# Patient Record
Sex: Female | Born: 1935 | Race: Black or African American | Hispanic: No | Marital: Single | Smoking: Never smoker
Health system: Southern US, Community
[De-identification: ages and names within clinical notes are randomized; demographics above are authoritative.]

---

## 1997-07-19 ENCOUNTER — Other Ambulatory Visit: Admission: RE | Admit: 1997-07-19 | Discharge: 1997-07-19 | Payer: Self-pay | Admitting: *Deleted

## 1999-03-26 ENCOUNTER — Encounter: Payer: Self-pay | Admitting: Emergency Medicine

## 1999-03-26 ENCOUNTER — Emergency Department (HOSPITAL_COMMUNITY): Admission: EM | Admit: 1999-03-26 | Discharge: 1999-03-26 | Payer: Self-pay | Admitting: Emergency Medicine

## 1999-04-17 ENCOUNTER — Encounter: Admission: RE | Admit: 1999-04-17 | Discharge: 1999-04-17 | Payer: Self-pay | Admitting: *Deleted

## 1999-04-17 ENCOUNTER — Encounter: Payer: Self-pay | Admitting: *Deleted

## 2000-06-19 ENCOUNTER — Encounter: Payer: Self-pay | Admitting: Internal Medicine

## 2000-06-19 ENCOUNTER — Encounter: Admission: RE | Admit: 2000-06-19 | Discharge: 2000-06-19 | Payer: Self-pay | Admitting: Internal Medicine

## 2001-03-10 ENCOUNTER — Encounter: Admission: RE | Admit: 2001-03-10 | Discharge: 2001-03-10 | Payer: Self-pay | Admitting: Internal Medicine

## 2001-03-10 ENCOUNTER — Encounter: Payer: Self-pay | Admitting: Internal Medicine

## 2001-06-08 ENCOUNTER — Encounter: Admission: RE | Admit: 2001-06-08 | Discharge: 2001-06-08 | Payer: Self-pay | Admitting: Internal Medicine

## 2001-06-08 ENCOUNTER — Encounter: Payer: Self-pay | Admitting: Internal Medicine

## 2002-06-21 ENCOUNTER — Encounter: Admission: RE | Admit: 2002-06-21 | Discharge: 2002-06-21 | Payer: Self-pay | Admitting: Internal Medicine

## 2002-06-21 ENCOUNTER — Encounter: Payer: Self-pay | Admitting: Internal Medicine

## 2003-06-27 ENCOUNTER — Encounter: Admission: RE | Admit: 2003-06-27 | Discharge: 2003-06-27 | Payer: Self-pay | Admitting: Internal Medicine

## 2003-07-11 ENCOUNTER — Encounter: Admission: RE | Admit: 2003-07-11 | Discharge: 2003-07-11 | Payer: Self-pay | Admitting: Internal Medicine

## 2003-07-11 IMAGING — CR DG CHEST 2V
2 series · 2 of 2 positions shown · non-contrast
Comparison: none

CLINICAL DATA: Cough and wheezing in the right lower lobe.
 PA AND LATERAL CHEST
 Comparison [DATE].

 There is slight tortuosity of the thoracic aorta, but the overall heart size and vascularity are normal.  The patient does have some mild linear atelectasis at both lung bases without acute infiltrates or effusions.  No significant bony abnormality.
 IMPRESSION
 Mild bibasilar atelectasis.

[view not recorded (1 of 2)]
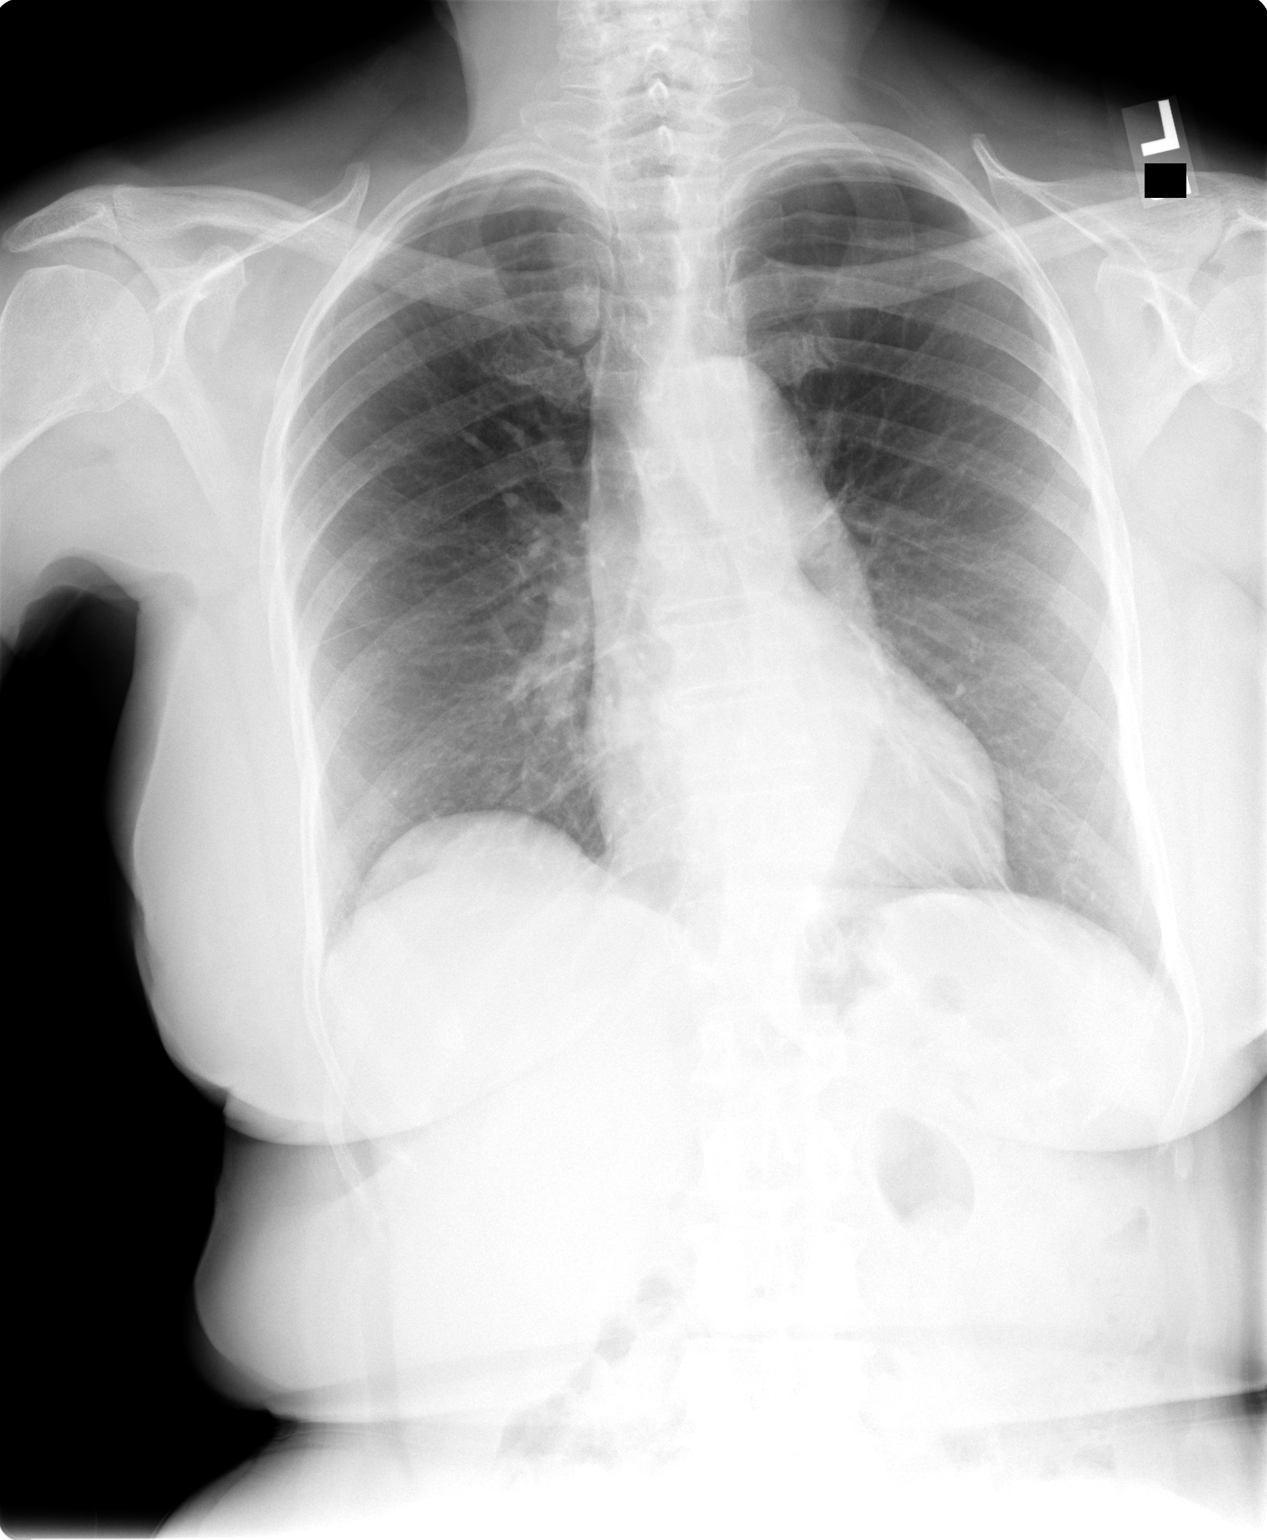

[view not recorded (2 of 2)]
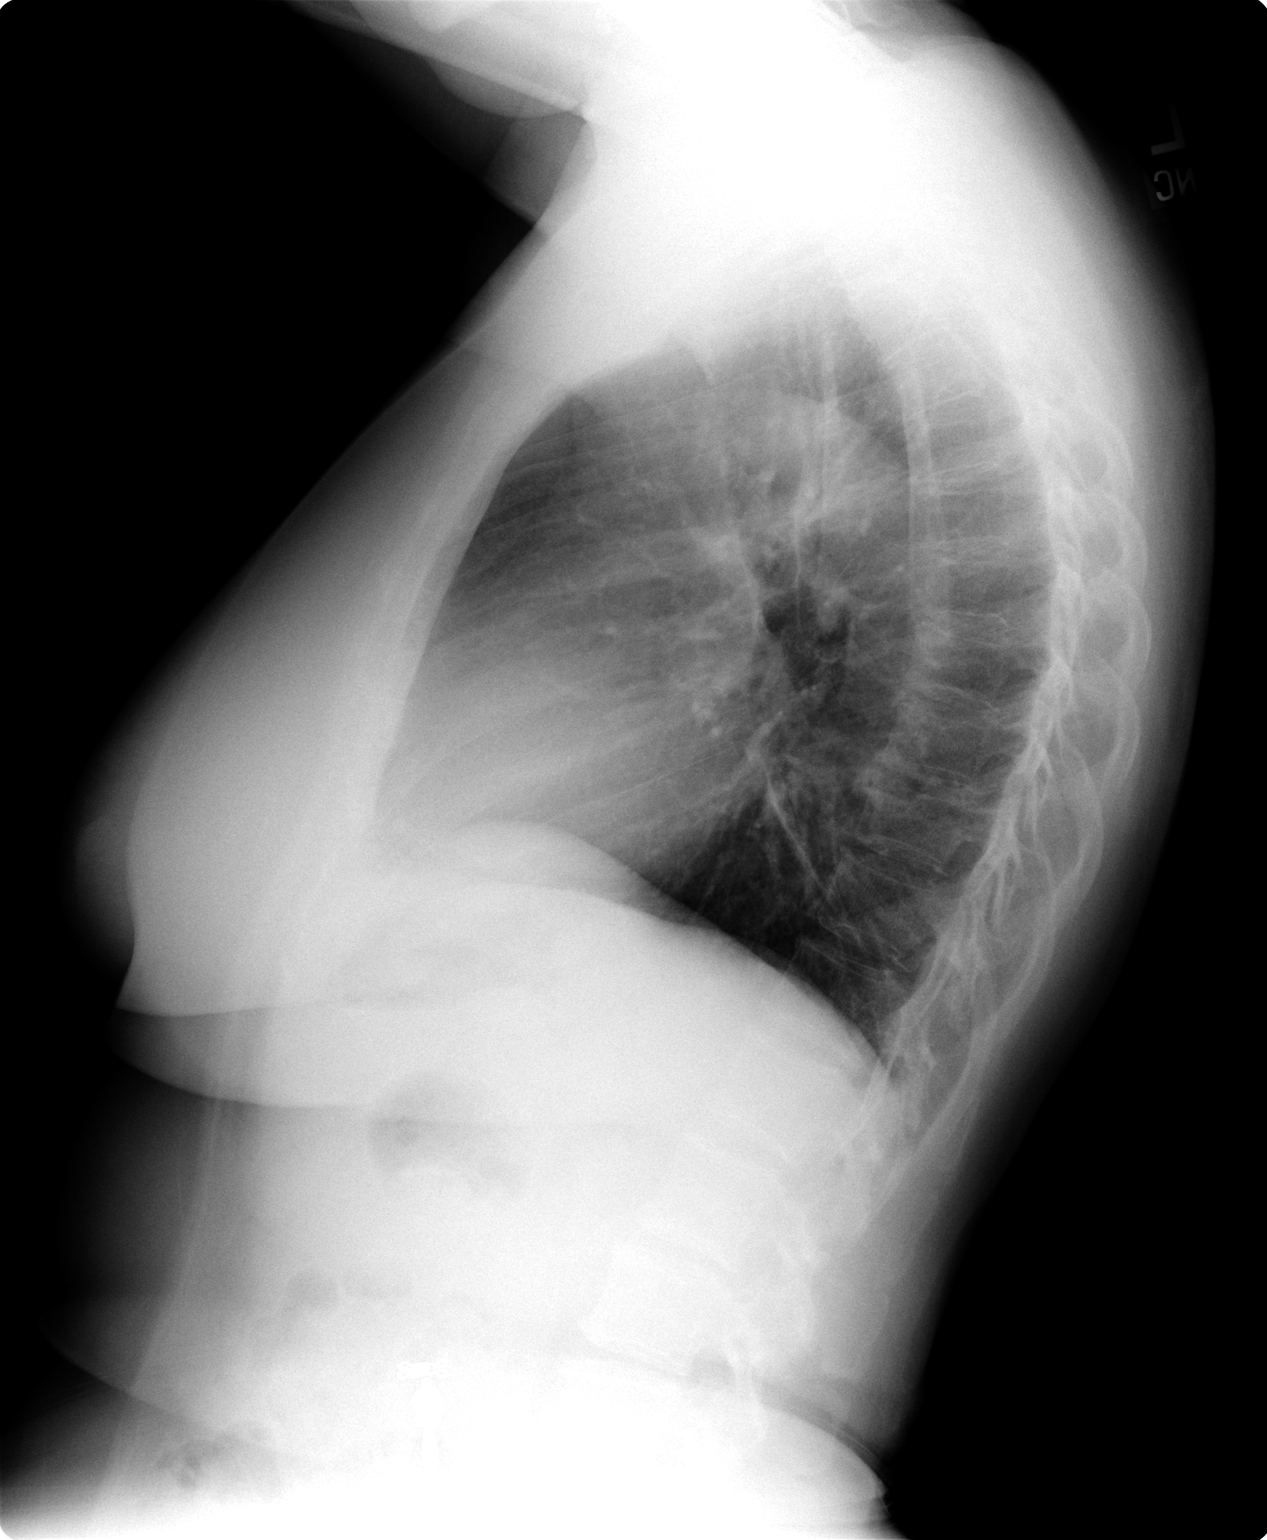

[2 of 2 positions shown; findings below may reference images not displayed]

## 2003-08-09 ENCOUNTER — Encounter (INDEPENDENT_AMBULATORY_CARE_PROVIDER_SITE_OTHER): Payer: Self-pay | Admitting: Specialist

## 2003-08-09 ENCOUNTER — Ambulatory Visit (HOSPITAL_COMMUNITY): Admission: RE | Admit: 2003-08-09 | Discharge: 2003-08-09 | Payer: Self-pay | Admitting: Gastroenterology

## 2004-06-28 ENCOUNTER — Encounter: Admission: RE | Admit: 2004-06-28 | Discharge: 2004-06-28 | Payer: Self-pay | Admitting: Internal Medicine

## 2004-10-15 ENCOUNTER — Encounter: Admission: RE | Admit: 2004-10-15 | Discharge: 2004-10-15 | Payer: Self-pay | Admitting: Nephrology

## 2004-10-15 IMAGING — US US RENAL
1 series · 14 of 25 positions shown · non-contrast
Comparison: none

CLINICAL DATA: Chronic renal insufficiency, hyperlipidemia

Renal ultrasound:
Right kidney measures at least 8.6 cm in length, left 9.2 cm. Renal parenchyma
is slightly echogenic compared to the adjacent liver. No focal lesions
identified. No hydronephrosis. Urinary bladder incompletely distended.

[Series 1: unknown · 0.19mm/px · 14 of 30 slices shown]
[im 1/30]
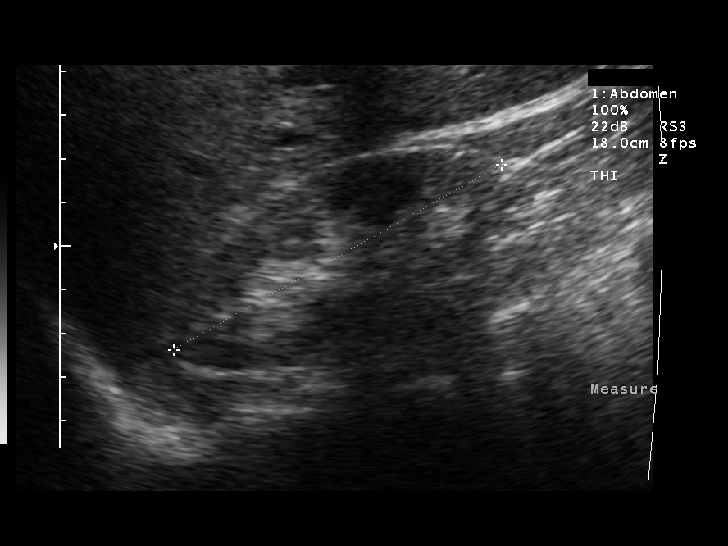
[im 3/30]
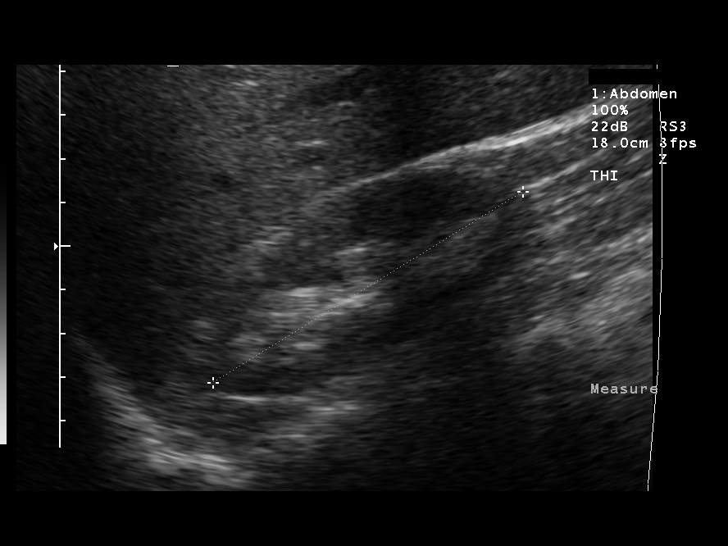
[im 5/30]
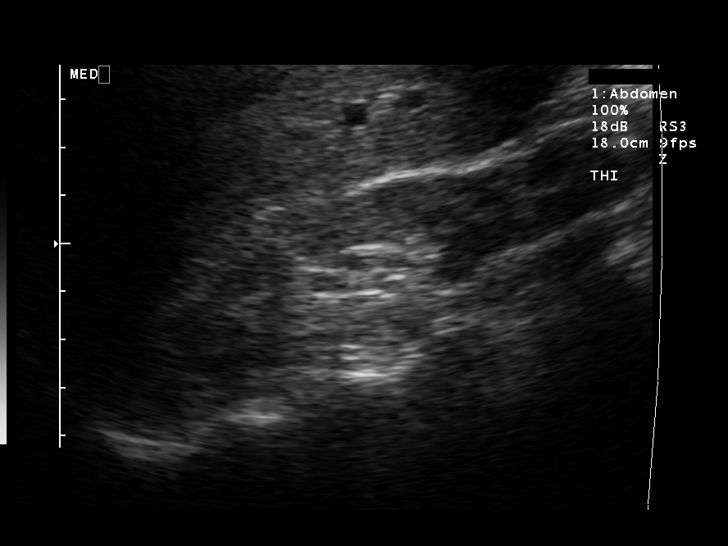
[im 8/30]
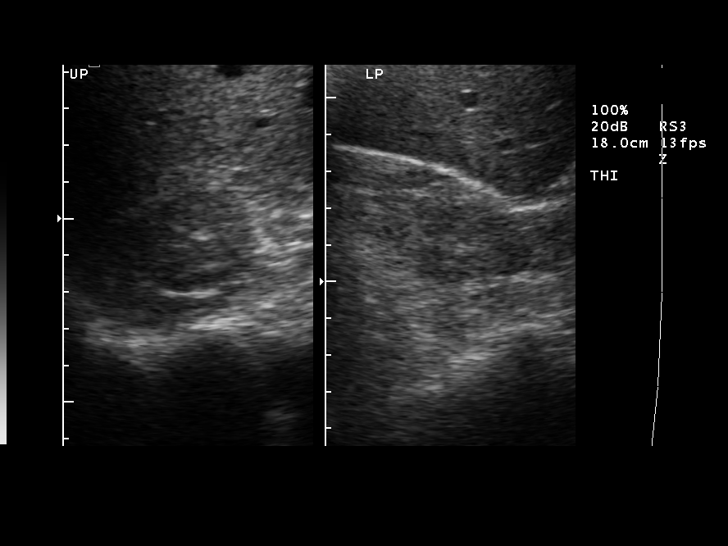
[im 10/30]
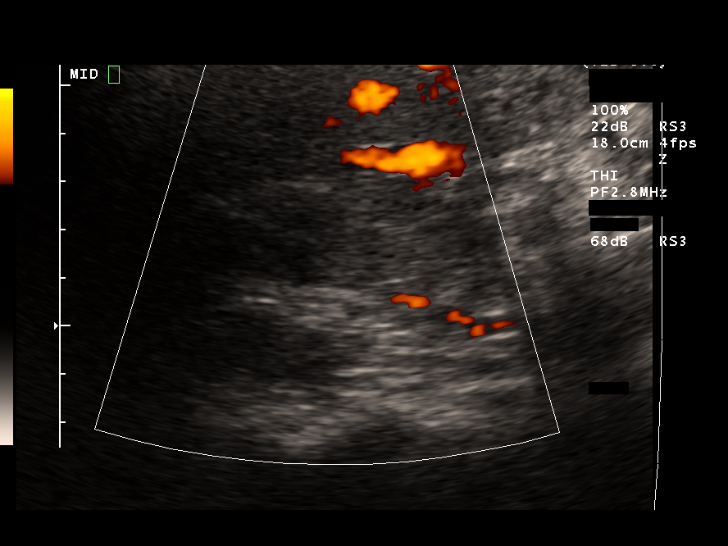
[im 11/30]
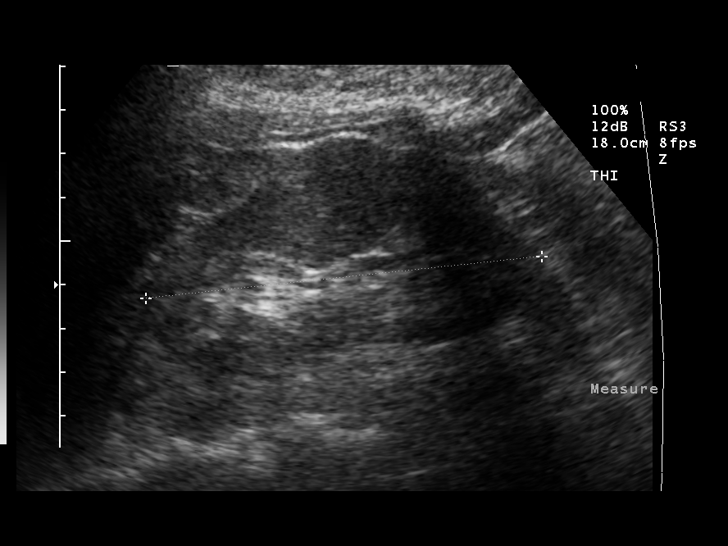
[im 14/30]
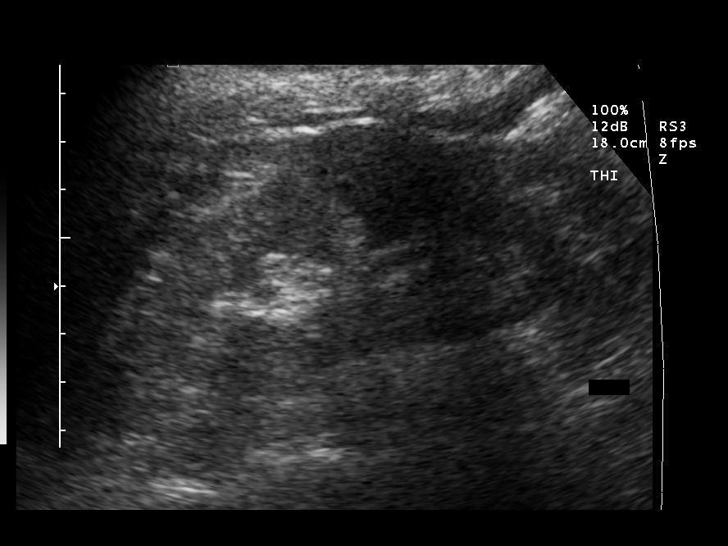
[im 16/30]
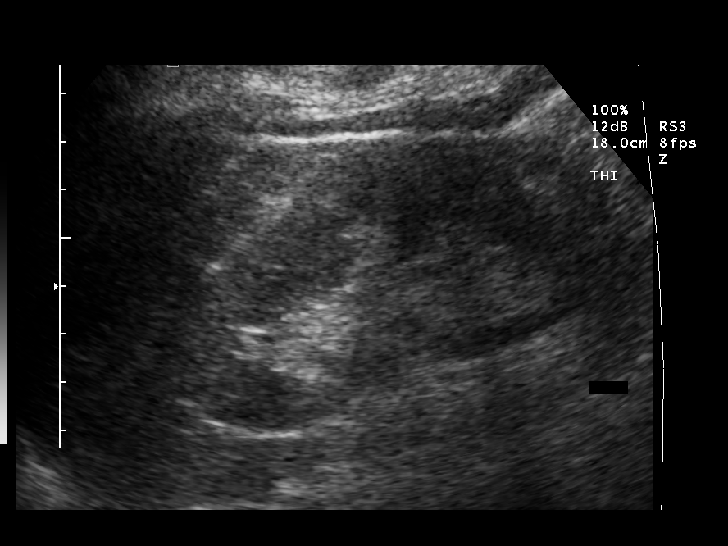
[im 19/30]
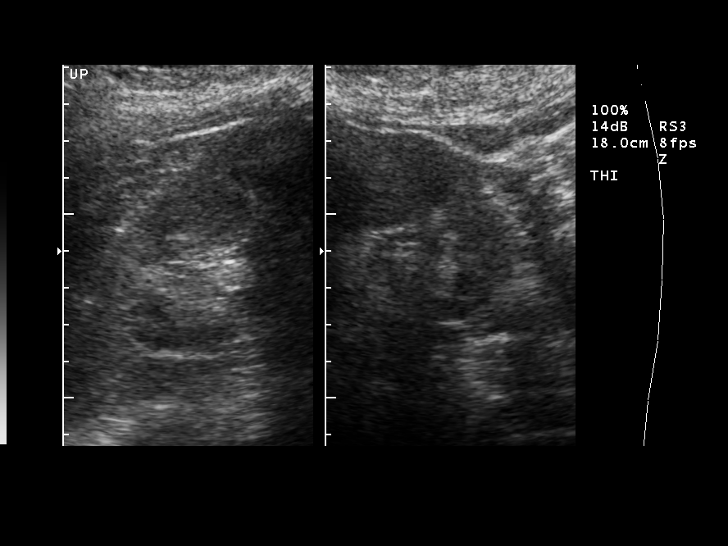
[im 20/30]
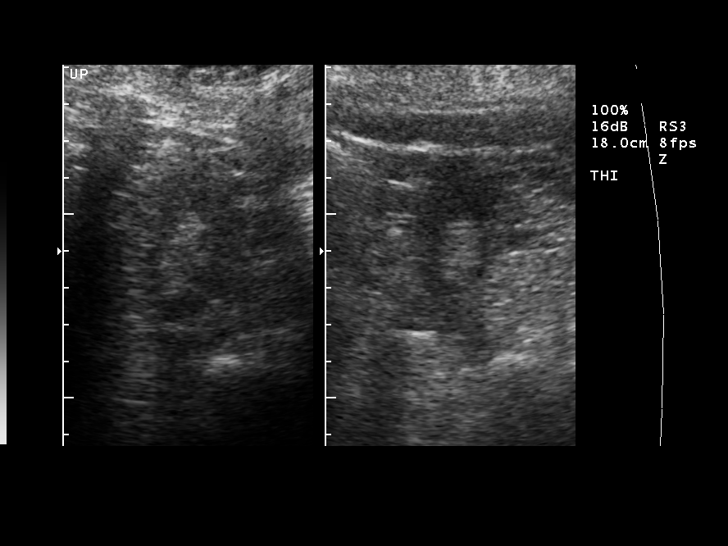
[im 22/30]
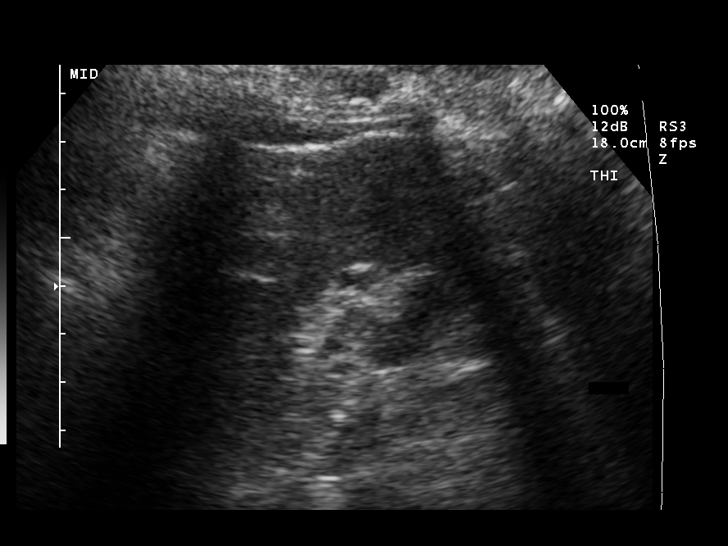
[im 25/30]
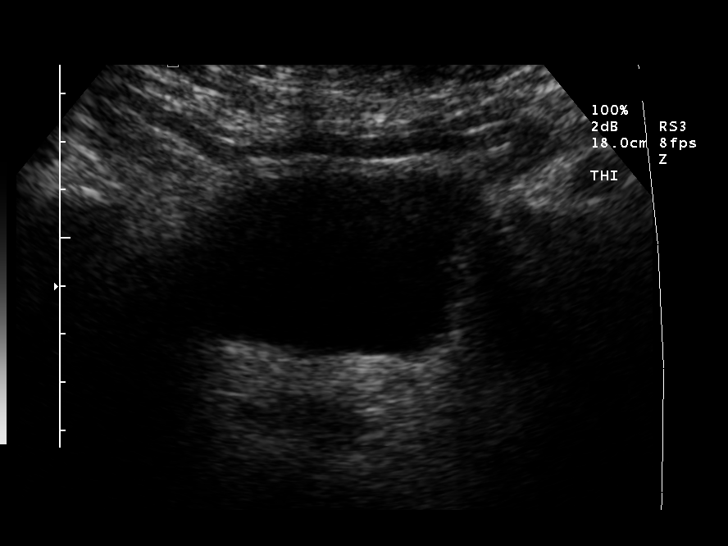
[im 27/30]
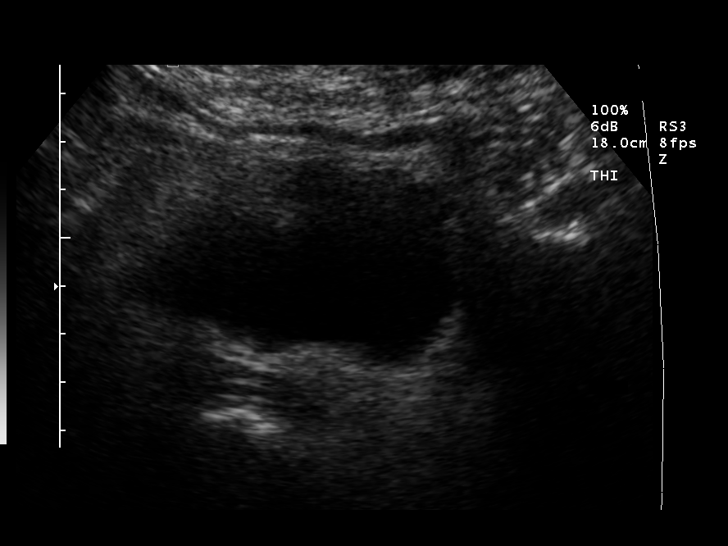
[im 30/30]
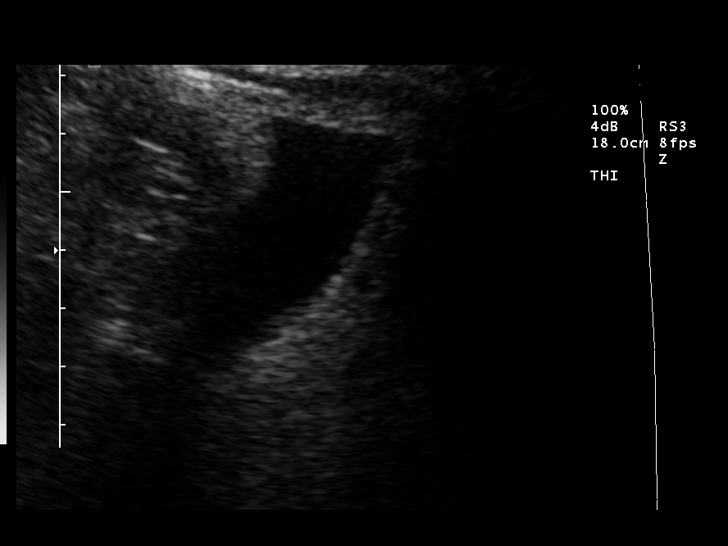

[14 of 25 positions shown; findings below may reference images not displayed]

IMPRESSION: 1. Negative for hydronephrosis.
2. Mildly echogenic renal parenchyma, a nonspecific indicator of medical renal
disease.

## 2005-07-02 ENCOUNTER — Encounter: Admission: RE | Admit: 2005-07-02 | Discharge: 2005-07-02 | Payer: Self-pay | Admitting: Internal Medicine

## 2006-07-07 ENCOUNTER — Encounter: Admission: RE | Admit: 2006-07-07 | Discharge: 2006-07-07 | Payer: Self-pay | Admitting: Internal Medicine

## 2007-05-22 ENCOUNTER — Emergency Department (HOSPITAL_COMMUNITY): Admission: EM | Admit: 2007-05-22 | Discharge: 2007-05-22 | Payer: Self-pay | Admitting: Emergency Medicine

## 2007-07-08 ENCOUNTER — Encounter: Admission: RE | Admit: 2007-07-08 | Discharge: 2007-07-08 | Payer: Self-pay | Admitting: Internal Medicine

## 2007-08-04 ENCOUNTER — Encounter: Admission: RE | Admit: 2007-08-04 | Discharge: 2007-08-04 | Payer: Self-pay | Admitting: Nephrology

## 2007-12-16 IMAGING — CR DG CHEST 2V
2 series · 2 of 2 positions shown · non-contrast
Comparison: [DATE]

CLINICAL DATA: Dry cough, weight loss

CHEST - 2 VIEW

[view not recorded (1 of 2)]
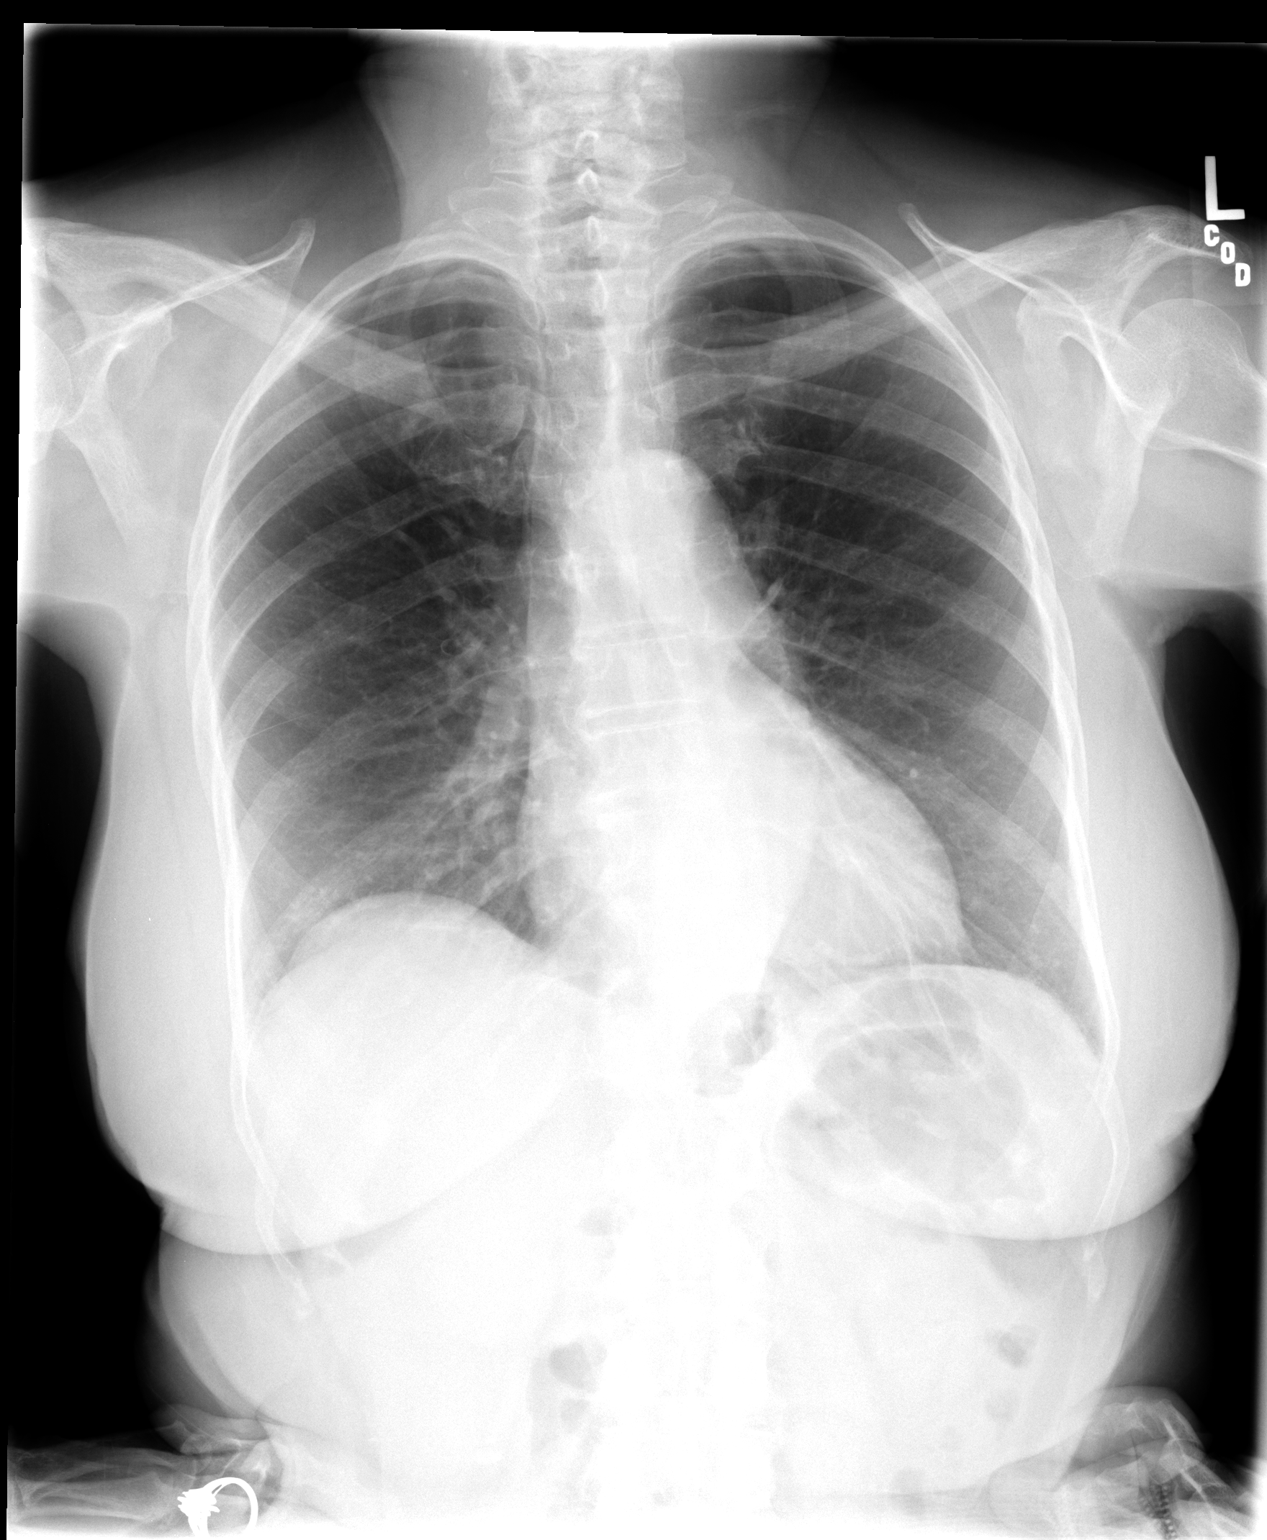

[view not recorded (2 of 2)]
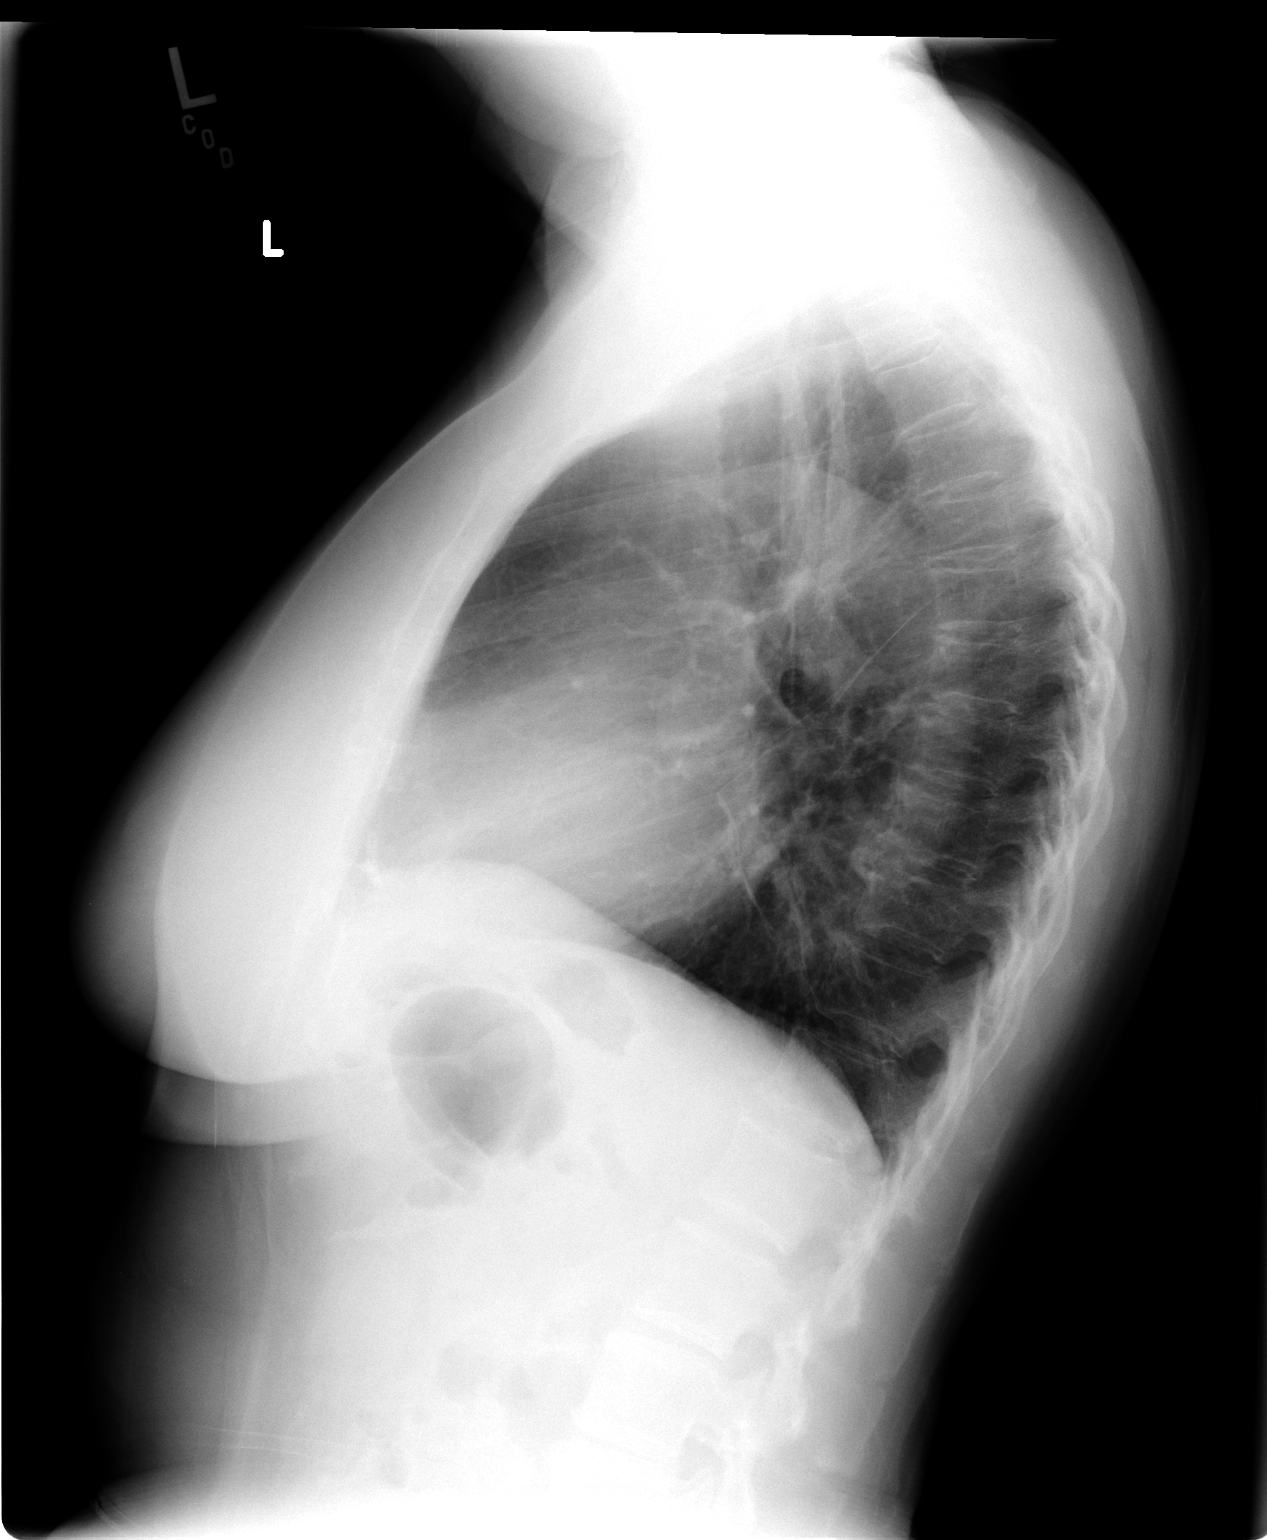

[2 of 2 positions shown; findings below may reference images not displayed]

FINDINGS: Linear scarring is stable at the left lung base.  No
active infiltrate or effusion is seen.  Heart size is stable.  No
bony abnormality is seen.
IMPRESSION: Stable chest x-ray.  No active lung disease.

## 2008-02-23 ENCOUNTER — Encounter: Admission: RE | Admit: 2008-02-23 | Discharge: 2008-02-23 | Payer: Self-pay | Admitting: Internal Medicine

## 2008-02-23 IMAGING — CR DG FOOT COMPLETE 3+V*L*
3 series · 3 of 3 positions shown · non-contrast
Comparison: None

CLINICAL DATA: Left foot pain swelling for 3 weeks.  No known
injury.  Question gout.

LEFT FOOT - COMPLETE 3+ VIEW

[view not recorded (1 of 3)]
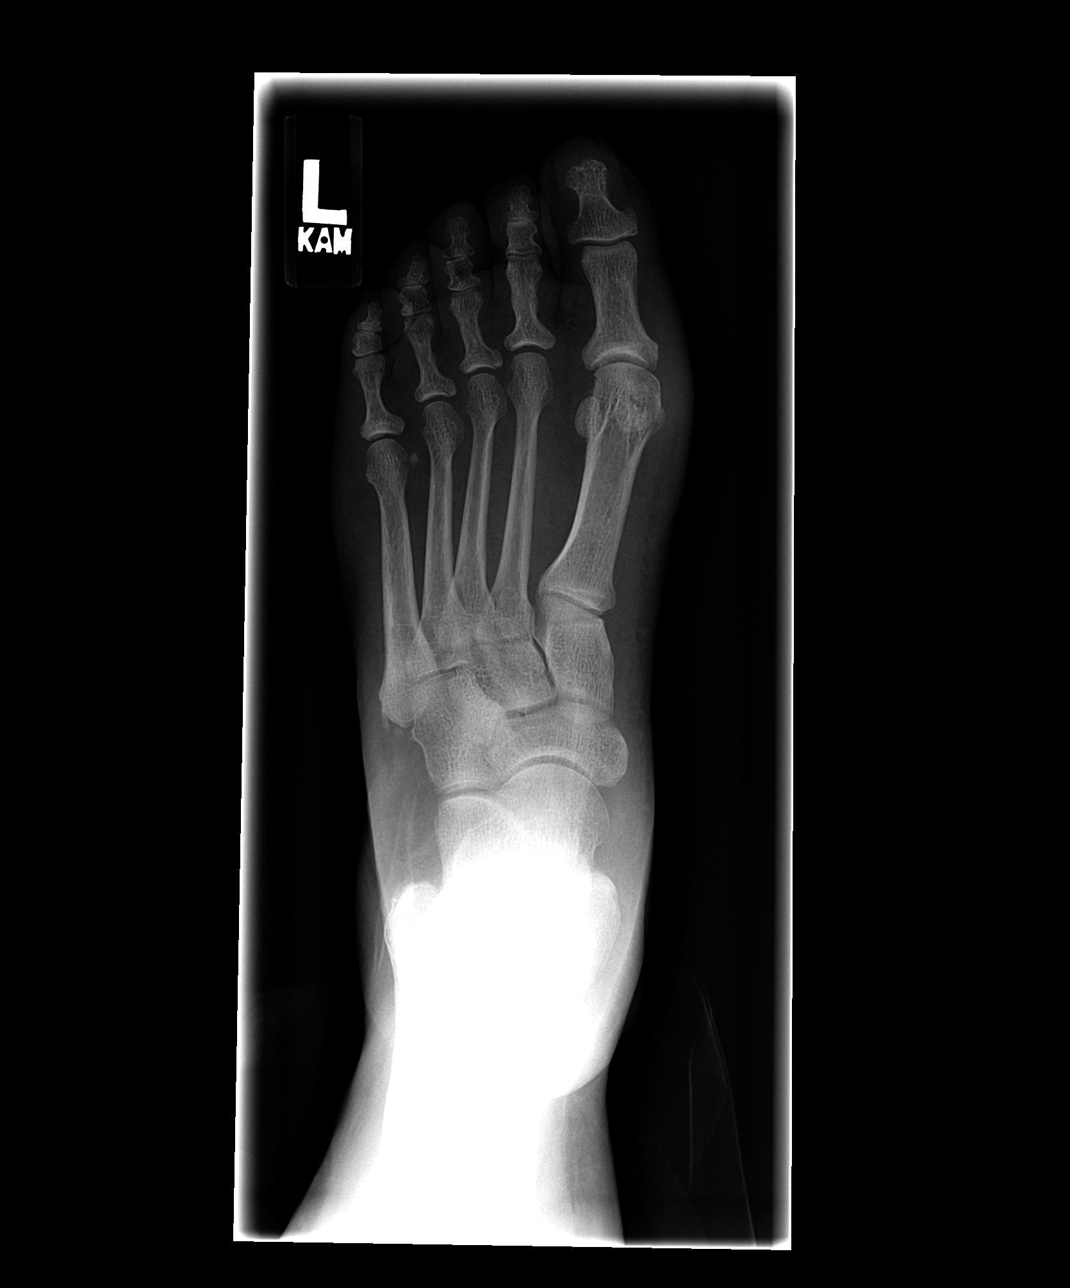

[view not recorded (2 of 3)]
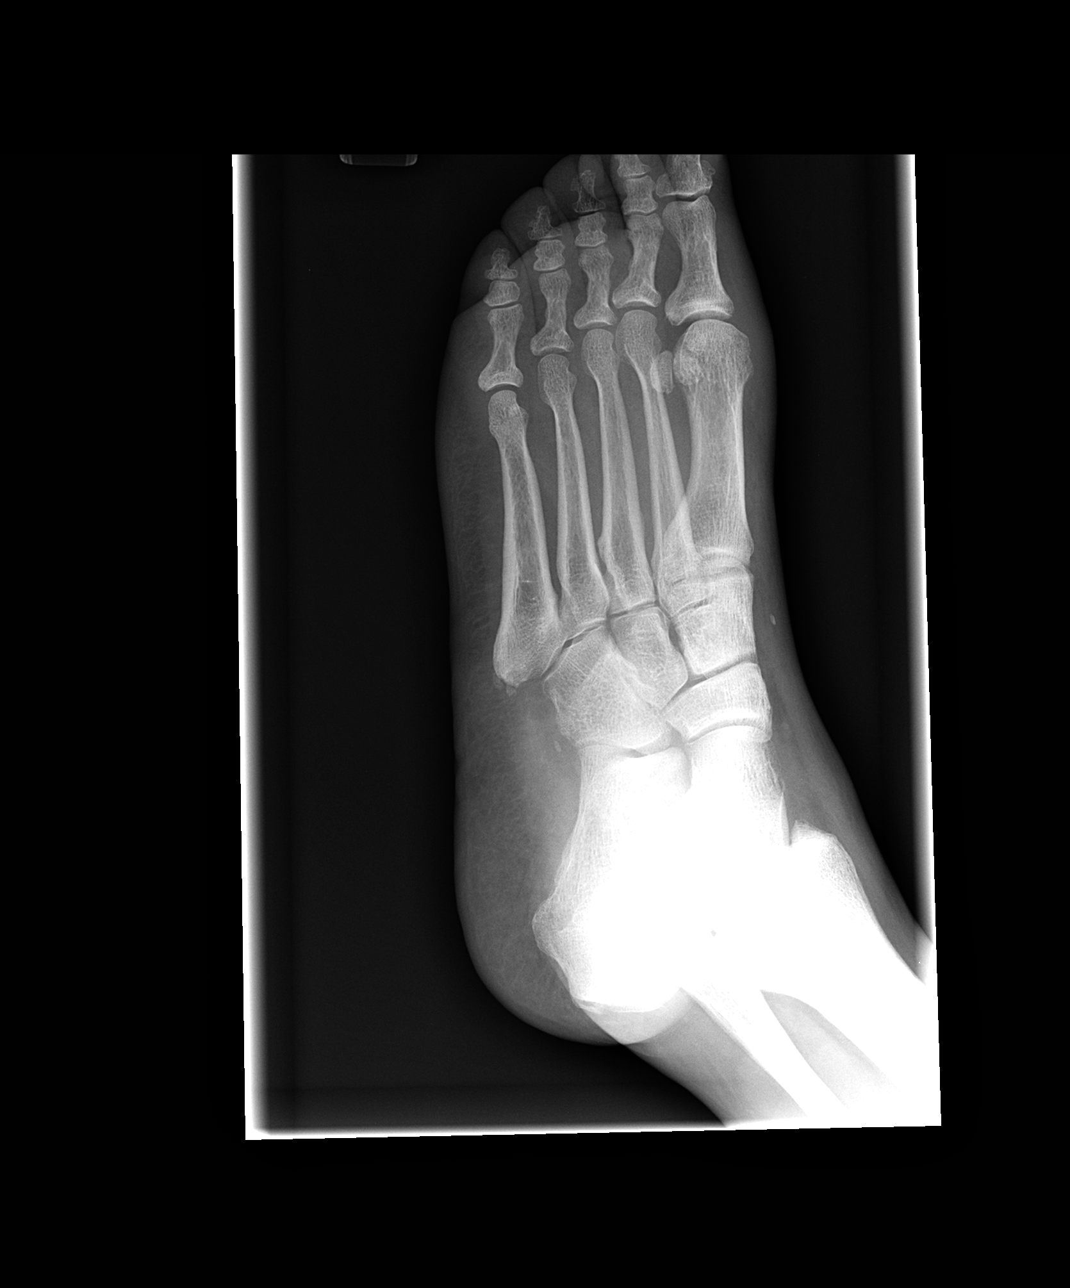

[view not recorded (3 of 3)]
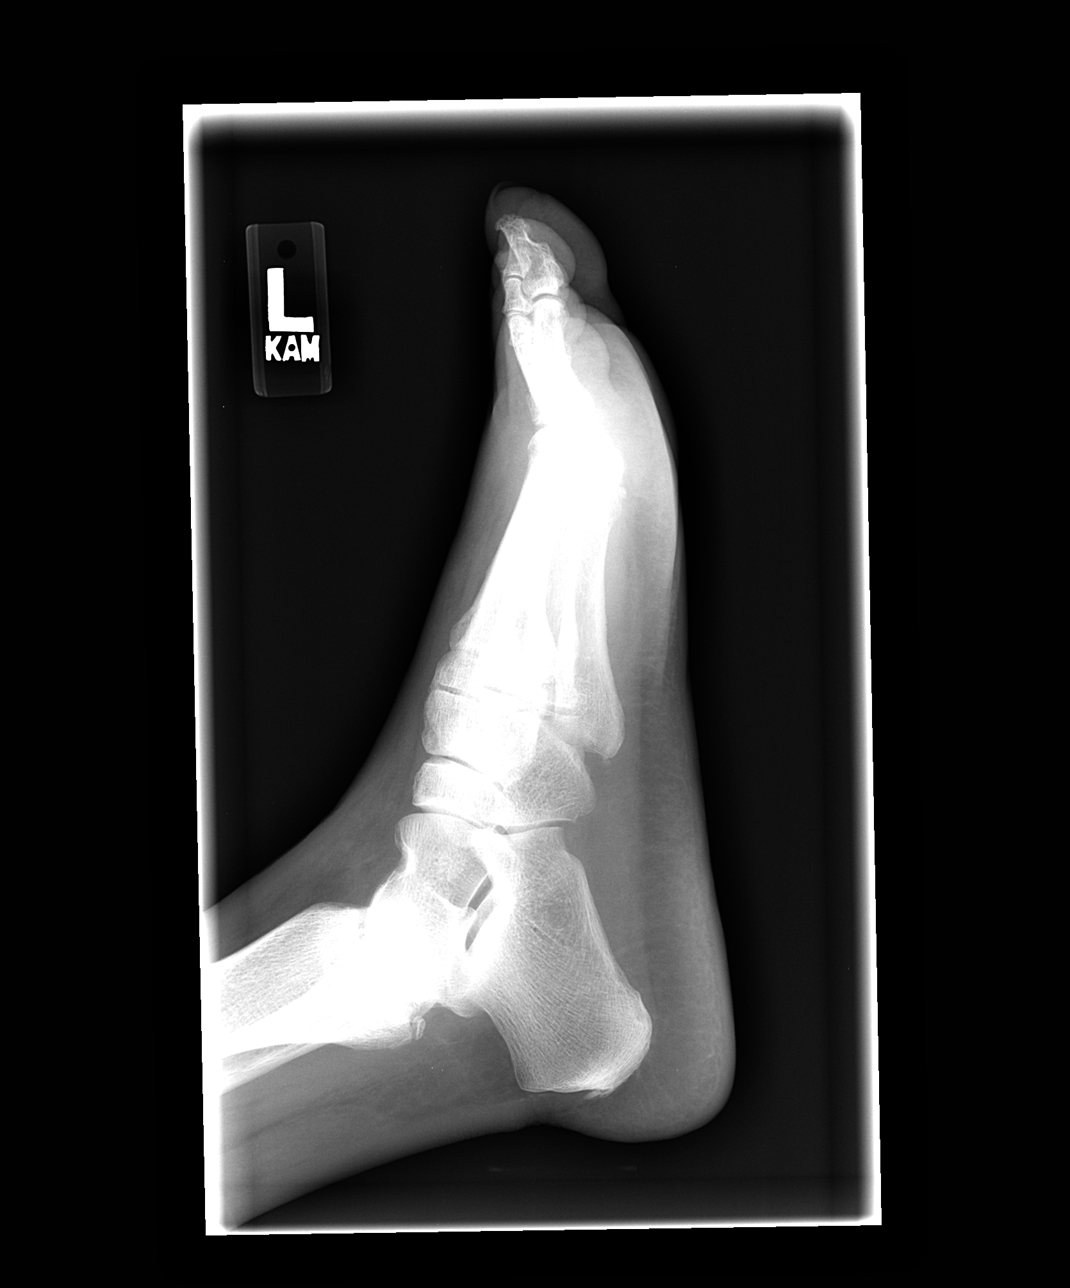

[3 of 3 positions shown; findings below may reference images not displayed]

FINDINGS: There is soft tissue swelling in the forefoot, especially
around the first metatarsal phalangeal joint.  No erosive changes
or suspicious soft tissue calcifications are seen in this area.
There is no evidence of acute fracture, dislocation or bone
destruction.  Mild degenerative changes are present at the first
metatarsal phalangeal joint.  There is a small dorsal calcaneal
spur.
IMPRESSION: 1.  Nonspecific left forefoot soft tissue swelling.  No specific
radiographic signs of gout are demonstrated.
2.  No evidence of bone destruction or acute fracture.

## 2008-07-11 ENCOUNTER — Encounter: Admission: RE | Admit: 2008-07-11 | Discharge: 2008-07-11 | Payer: Self-pay | Admitting: Internal Medicine

## 2008-08-08 ENCOUNTER — Encounter: Admission: RE | Admit: 2008-08-08 | Discharge: 2008-08-08 | Payer: Self-pay | Admitting: Gastroenterology

## 2008-08-08 IMAGING — CR DG BE W/ AIR HIGH DENSITY
19 of 24 series · 19 of 24 positions shown · non-contrast
Comparison: [HOSPITAL] at [REDACTED] [HOSPITAL] renal ultrasound
[DATE].

CLINICAL DATA: Incomplete colonoscopy.

AIR-CONTRAST BARIUM ENEMA
TECHNIQUE: After obtaining a scout radiograph air-contrast barium
enema was performed under fluoroscopy using high-density barium.
Fluoroscopy Time: 3.1 minutes.

[run (1 of 19)]
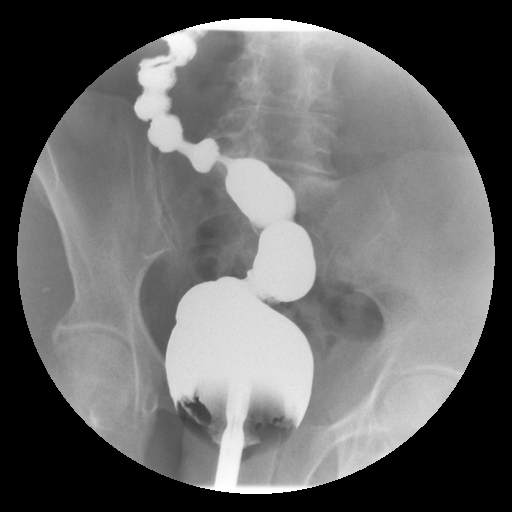

[run (2 of 19)]
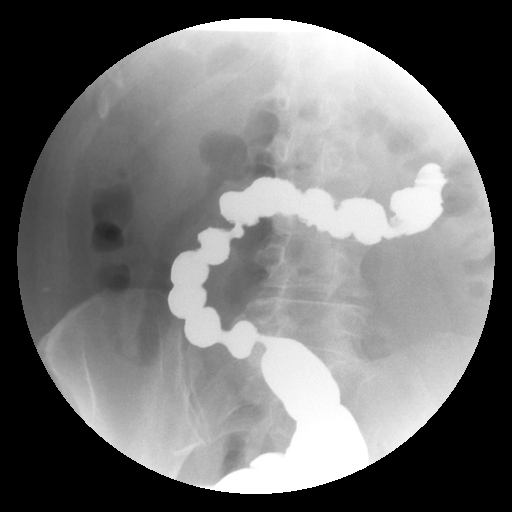

[run (3 of 19)]
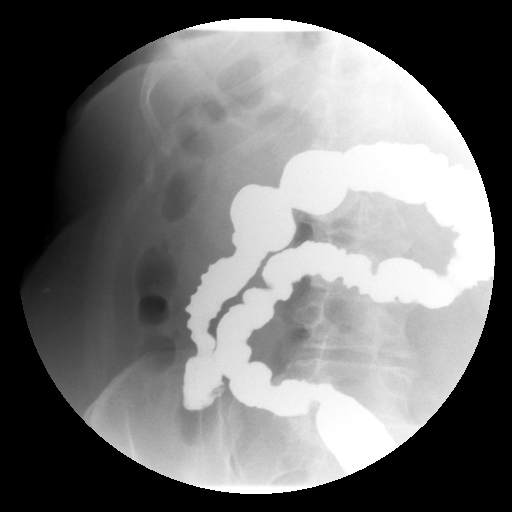

[run (4 of 19)]
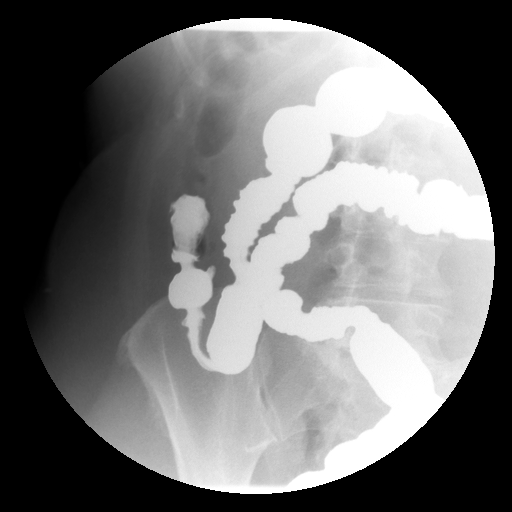

[run (5 of 19)]
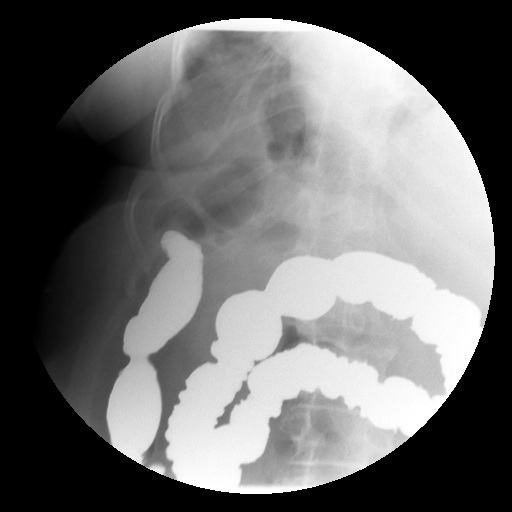

[run (6 of 19)]
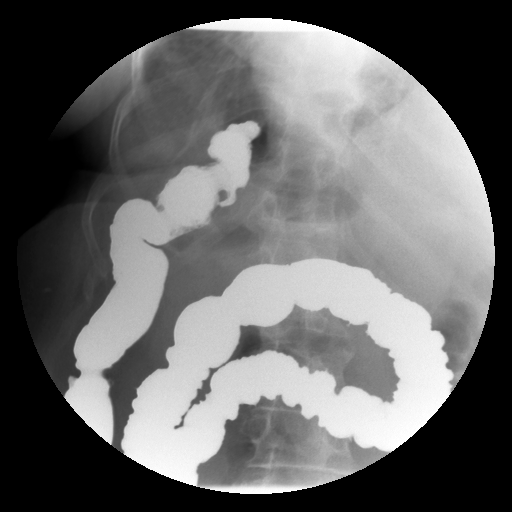

[run (7 of 19)]
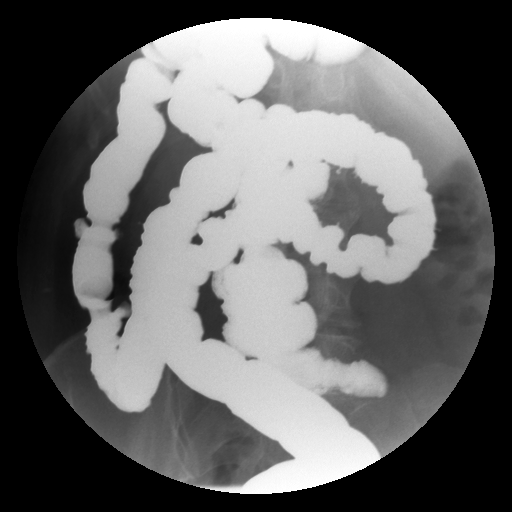

[run (8 of 19)]
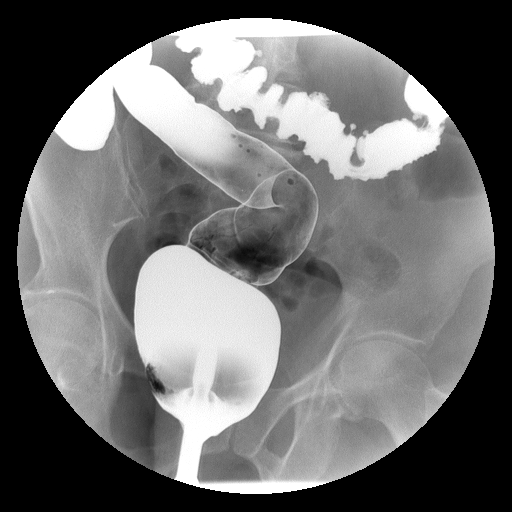

[run (9 of 19)]
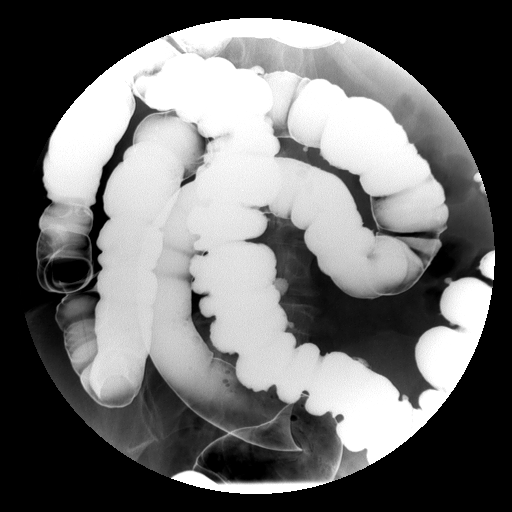

[run (10 of 19)]
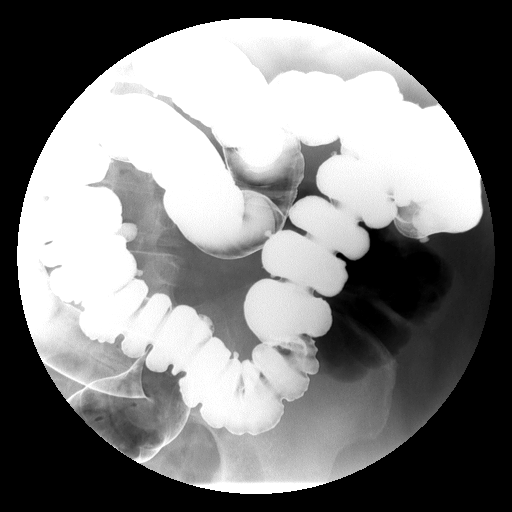

[run (11 of 19)]
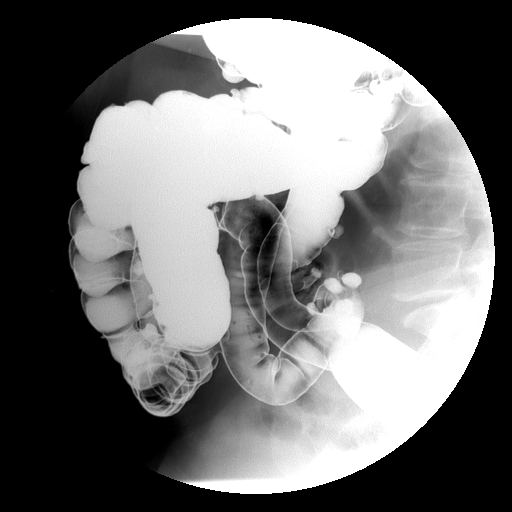

[run (12 of 19)]
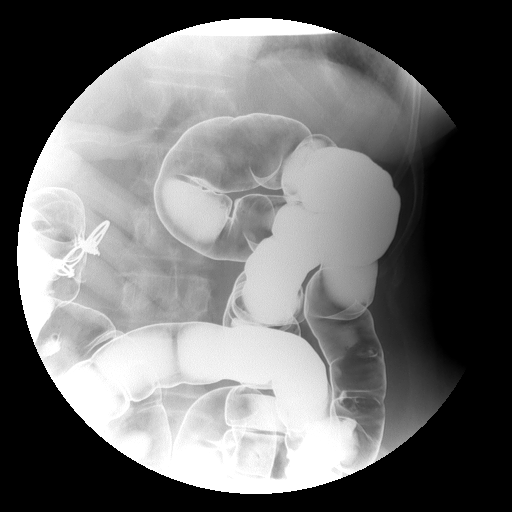

[run (13 of 19)]
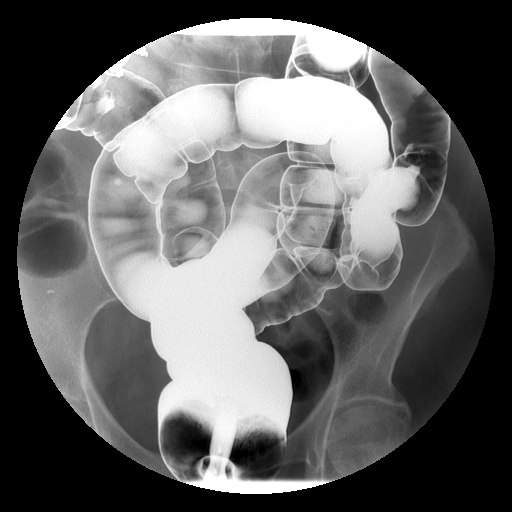

[run (14 of 19)]
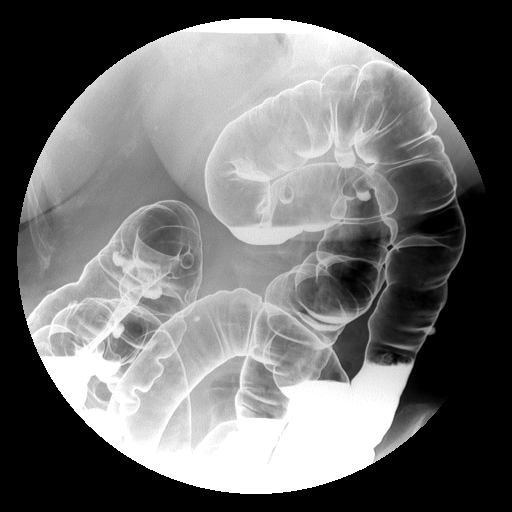

[run (15 of 19)]
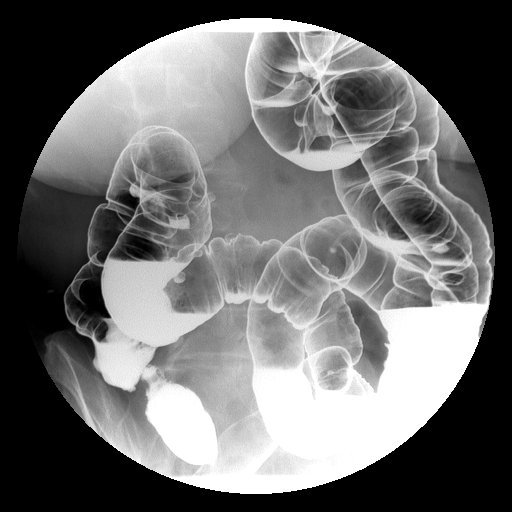

[run (16 of 19)]
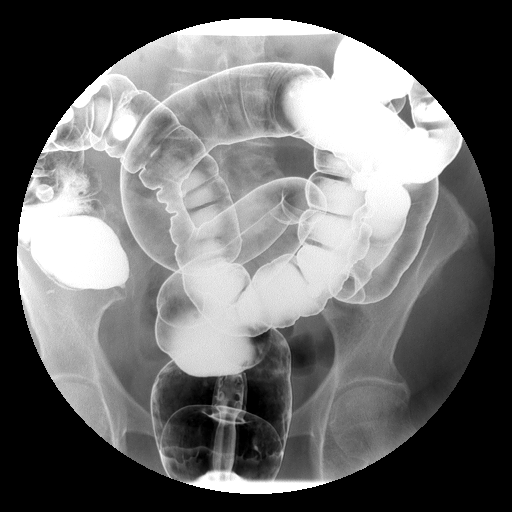

[run (17 of 19)]
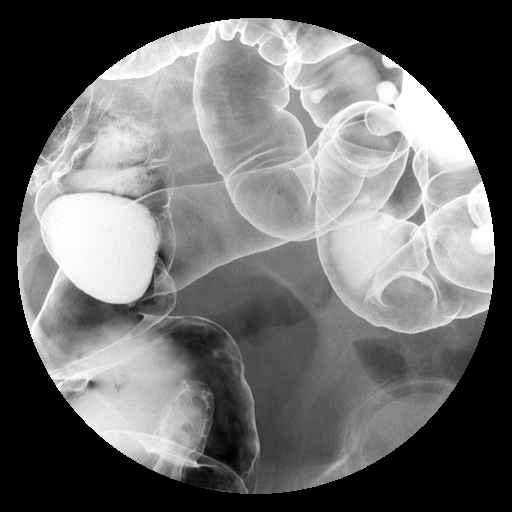

[run (18 of 19)]
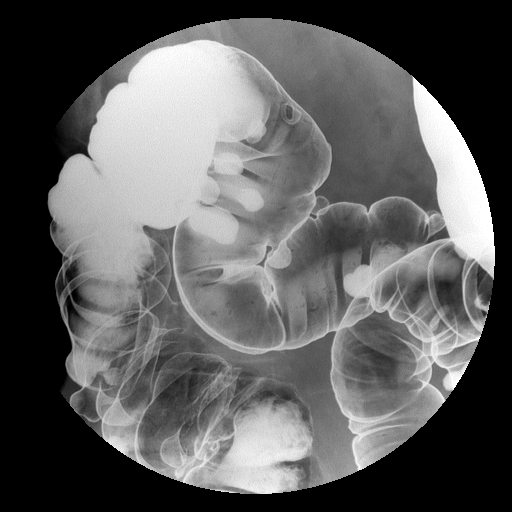

[run (19 of 19)]
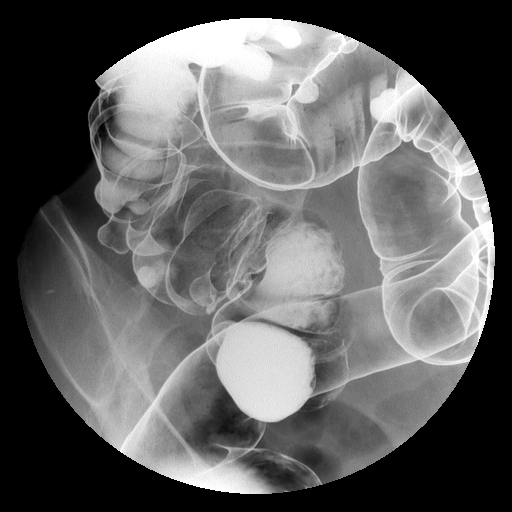

[19 of 24 positions shown; findings below may reference images not displayed]

FINDINGS: Colon was filled with mixture barium air from rectum to
cecum with no reflux into terminal ileum or appendix.  Retained
colonic feces is noted at ascending colon as mobile small filling
defects.  Redundant sigmoid colon variant visualized.  Scattered
slight to moderate ascending, transverse, and descending colonic
diverticulosis visualized without acute diverticulitis findings.
No other intrinsic or extrinsic colonic lesions visualized.
IMPRESSION: 1.  Anatomic variant redundant sigmoid colon.
2.  Slight to moderate diffuse diverticulosis without acute
inflammation.
3.  Slight retained colonic feces primarily ascending colon.
4.  Otherwise, normal.

## 2009-02-27 ENCOUNTER — Emergency Department (HOSPITAL_COMMUNITY): Admission: EM | Admit: 2009-02-27 | Discharge: 2009-02-28 | Payer: Self-pay | Admitting: Emergency Medicine

## 2009-03-15 ENCOUNTER — Encounter: Admission: RE | Admit: 2009-03-15 | Discharge: 2009-03-15 | Payer: Self-pay | Admitting: Internal Medicine

## 2009-03-15 IMAGING — US US ABDOMEN COMPLETE
1 series · 13 of 25 positions shown · non-contrast
Comparison: [HOSPITAL] at [REDACTED] [HOSPITAL] barium enema
[DATE] and renal ultrasound [DATE].

CLINICAL DATA: Nausea and weight loss.

COMPLETE ABDOMINAL ULTRASOUND

[Series 1: us abdomen complete · 0.30mm/px · 13 of 64 slices shown]
[im 1/64]
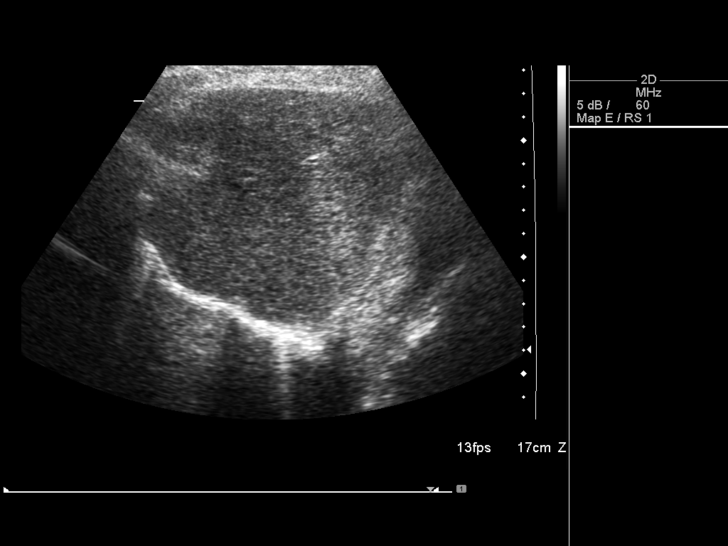
[im 6/64]
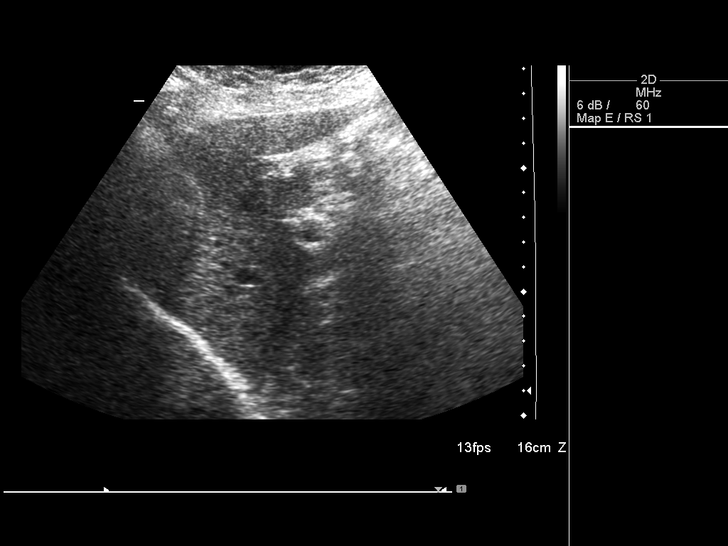
[im 11/64]
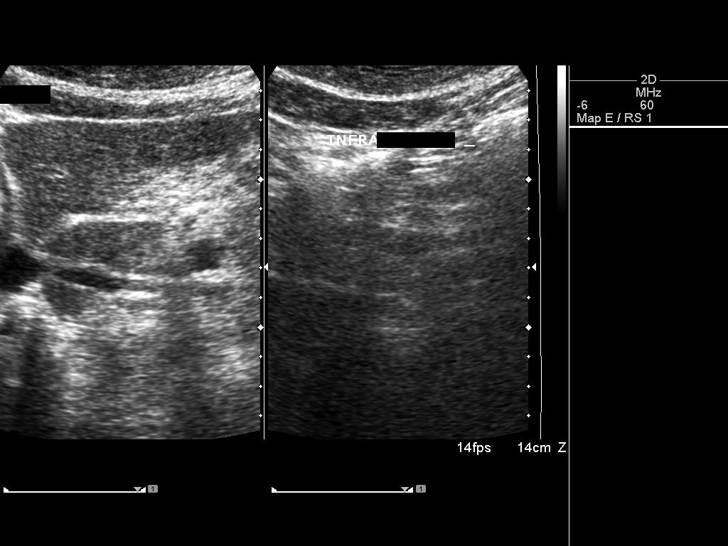
[im 16/64]
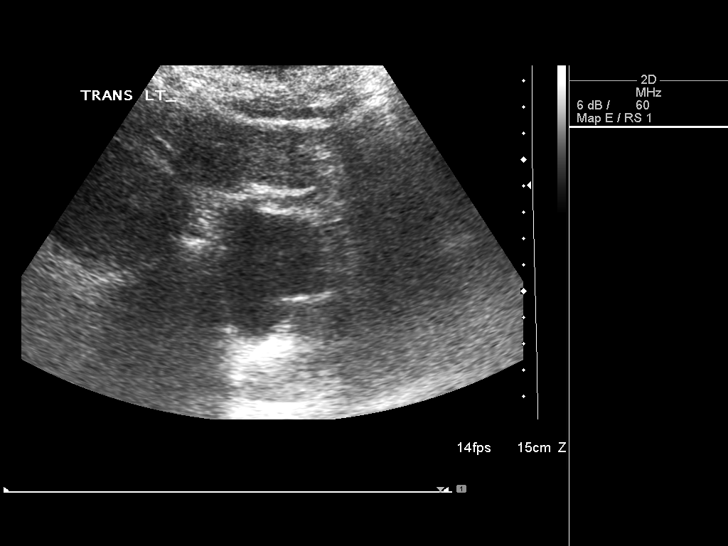
[im 22/64]
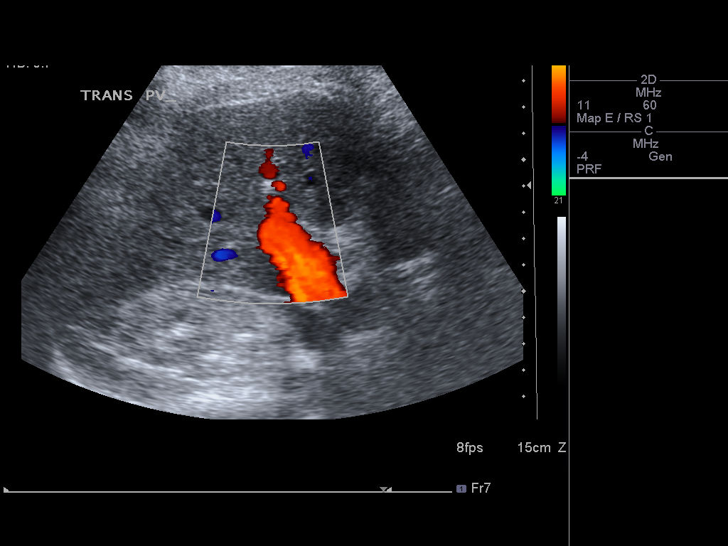
[im 27/64]
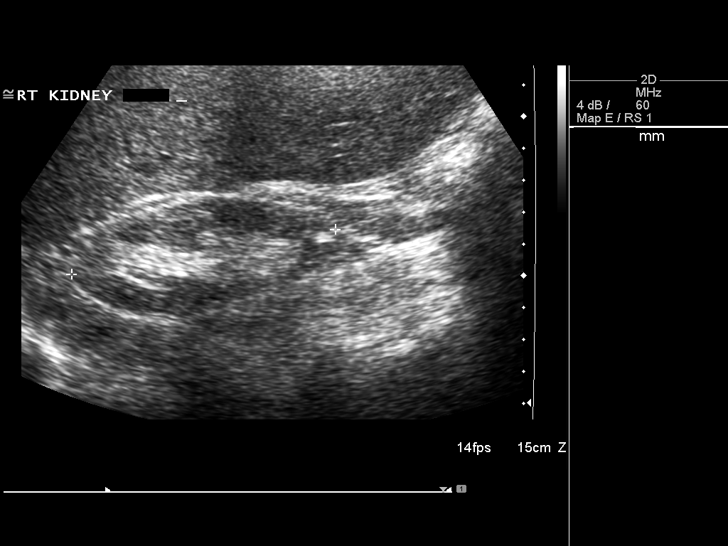
[im 32/64]
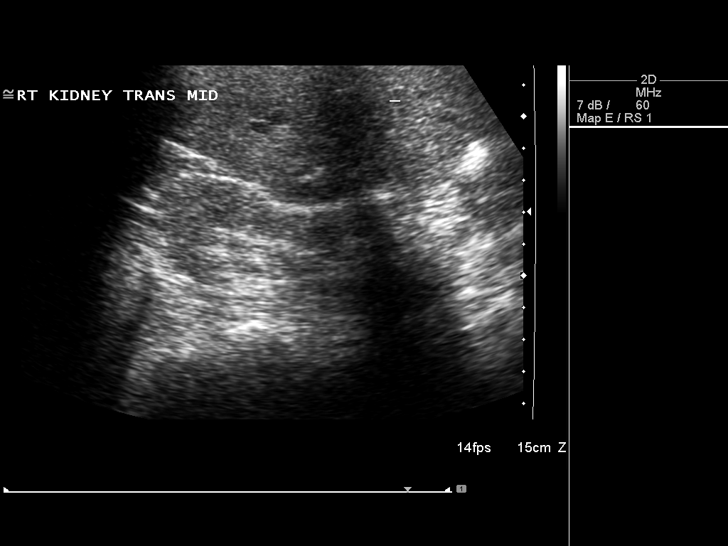
[im 37/64]
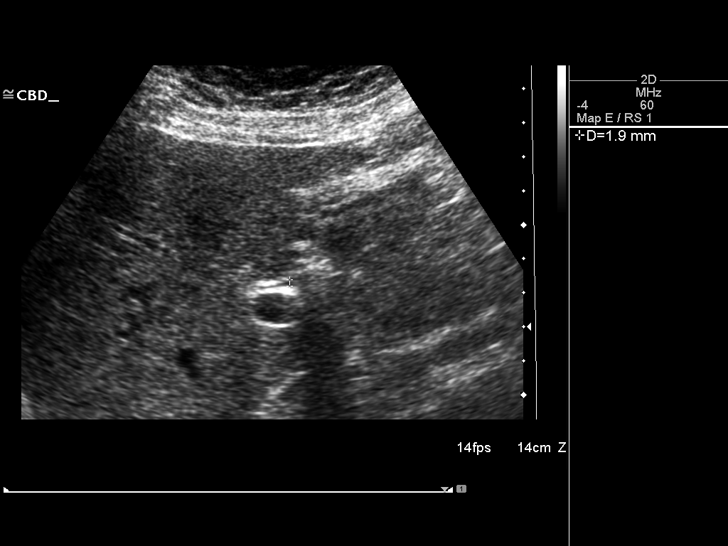
[im 43/64]
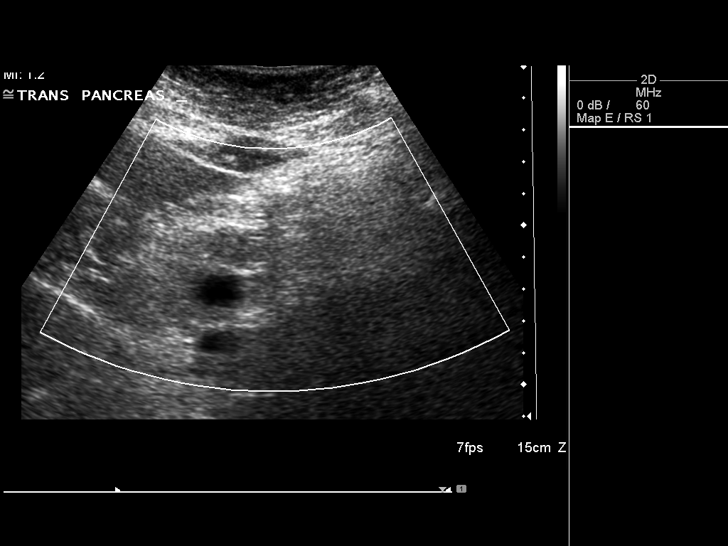
[im 48/64]
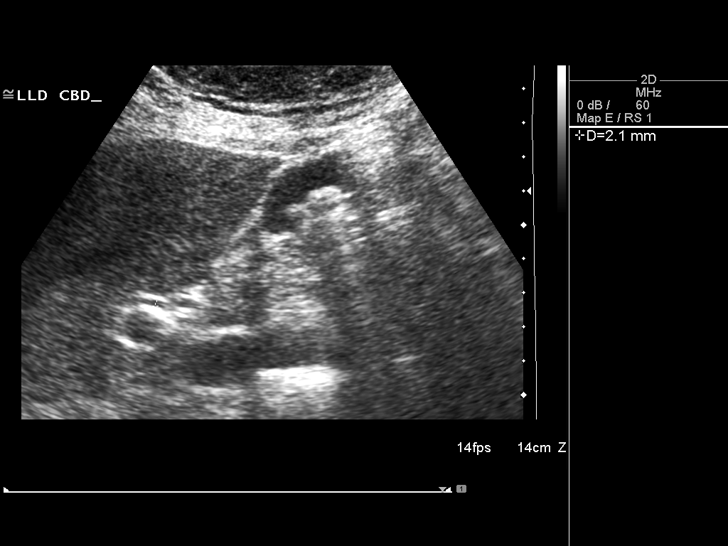
[im 53/64]
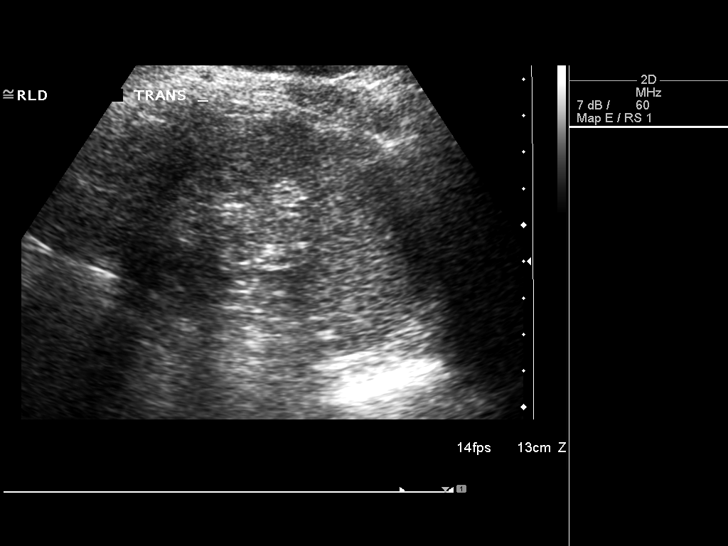
[im 58/64]
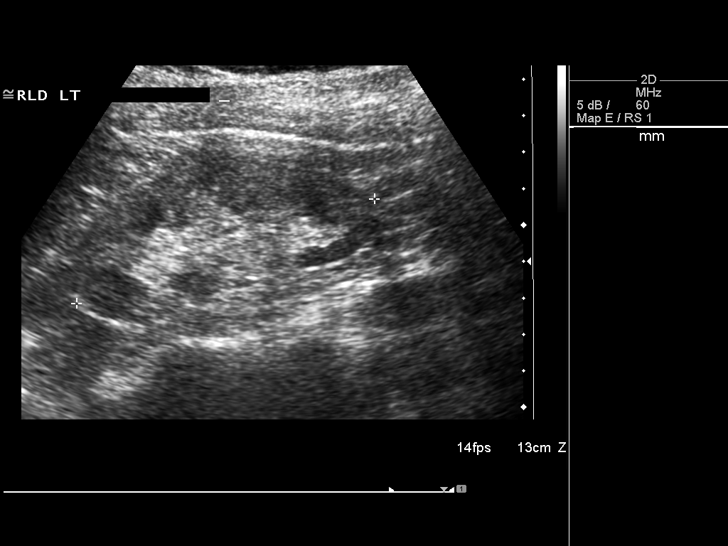
[im 64/64]
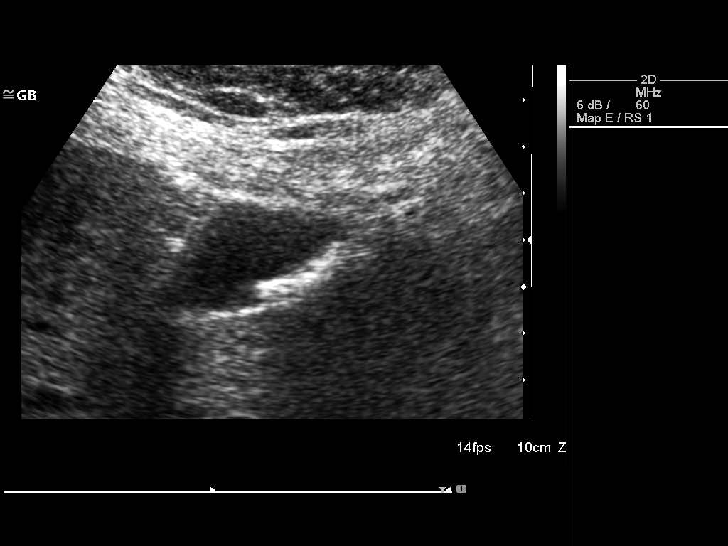

[13 of 25 positions shown; findings below may reference images not displayed]

FINDINGS: Gallbladder:  Currently nonobstructing multiple small shadowing
gallstones (7 mm and less) visualized with normal 2 mm gallbladder
wall thickness and no sonographic Murphy's sign evoked.

Common bile duct:  No dilated intrahepatic or extrahepatic bile
ducts seen with normal common bile duct diameter 2 mm.

Liver:  Sonographically normal without focal lesions.

IVC:  Infrahepatic portion obscured and otherwise unremarkable.

Pancreas:  Pancreatic tail obscured due to overlying
gastrointestinal gas with remaining pancreas unremarkable.

Spleen:  Sonographically normal measuring 5.9 cm long.

Right Kidney:  Slightly decreased size measuring 8.4 cm long
without hydronephrosis.  Renal parenchyma is isoechoic with the
liver consistent with nonspecific medical renal disease.

Left Kidney:  Slightly decreased in size measuring 8.7 cm long
without obstructive hydronephrosis.  Renal parenchyma is isoechoic
spleen consistent with nonspecific medical renal disease.

Abdominal aorta:  Normal in caliber with maximum proximal diameter
2.3 cm.

No free fluid noted.
IMPRESSION: 1.  Currently nonobstructing gallstones.
2.  Limited visualization pancreatic tail and inferior vena cava
due to overlying gas intestinal gas.
3.  Smaller sized kidneys with increase cortical echogenicity
consistent with nonspecific medical renal disease.
4.  Otherwise, negative.

## 2009-07-17 ENCOUNTER — Encounter: Admission: RE | Admit: 2009-07-17 | Discharge: 2009-07-17 | Payer: Self-pay | Admitting: Internal Medicine

## 2010-06-11 ENCOUNTER — Other Ambulatory Visit: Payer: Self-pay | Admitting: Internal Medicine

## 2010-06-11 DIAGNOSIS — Z1231 Encounter for screening mammogram for malignant neoplasm of breast: Secondary | ICD-10-CM

## 2010-07-10 LAB — CBC
HCT: 33.5 % — ABNORMAL LOW (ref 36.0–46.0)
MCHC: 32.6 g/dL (ref 30.0–36.0)
MCV: 93.8 fL (ref 78.0–100.0)
Platelets: 260 10*3/uL (ref 150–400)
WBC: 8.9 10*3/uL (ref 4.0–10.5)

## 2010-07-10 LAB — DIFFERENTIAL
Eosinophils Absolute: 0 10*3/uL (ref 0.0–0.7)
Eosinophils Relative: 0 % (ref 0–5)
Lymphs Abs: 0.3 10*3/uL — ABNORMAL LOW (ref 0.7–4.0)
Monocytes Absolute: 0.1 10*3/uL (ref 0.1–1.0)
Neutrophils Relative %: 96 % — ABNORMAL HIGH (ref 43–77)

## 2010-07-10 LAB — URINALYSIS, ROUTINE W REFLEX MICROSCOPIC
Bilirubin Urine: NEGATIVE
Glucose, UA: NEGATIVE mg/dL
Ketones, ur: NEGATIVE mg/dL
Urobilinogen, UA: 0.2 mg/dL (ref 0.0–1.0)

## 2010-07-10 LAB — COMPREHENSIVE METABOLIC PANEL
ALT: 12 U/L (ref 0–35)
AST: 23 U/L (ref 0–37)
Alkaline Phosphatase: 67 U/L (ref 39–117)
BUN: 20 mg/dL (ref 6–23)
CO2: 25 mEq/L (ref 19–32)
Calcium: 8.2 mg/dL — ABNORMAL LOW (ref 8.4–10.5)
Creatinine, Ser: 1.84 mg/dL — ABNORMAL HIGH (ref 0.4–1.2)
GFR calc non Af Amer: 26 mL/min — ABNORMAL LOW (ref >60)

## 2010-07-23 ENCOUNTER — Ambulatory Visit
Admission: RE | Admit: 2010-07-23 | Discharge: 2010-07-23 | Disposition: A | Discharge status: Home / Self Care | Payer: BLUE CROSS/BLUE SHIELD | Source: Ambulatory Visit | Attending: Internal Medicine | Admitting: Internal Medicine

## 2010-07-23 DIAGNOSIS — Z1231 Encounter for screening mammogram for malignant neoplasm of breast: Secondary | ICD-10-CM

## 2010-08-23 NOTE — Op Note (Signed)
NAME:  [name], [name]                         ACCOUNT NO.:  [id number]   MEDICAL RECORD NO.:  [id number]                   PATIENT TYPE:  AMB   LOCATION:  ENDO                                 FACILITY:  [facility]   PHYSICIAN:  Earle Gell, M.D.                DATE OF BIRTH:  10/14/1920   DATE OF PROCEDURE:  08/09/2003  DATE OF DISCHARGE:                                 OPERATIVE REPORT   REFERRING PHYSICIAN:  Dr. Hurshel Party   PROCEDURE:  Screening colonoscopy.   PROCEDURE INDICATION:  [name] is an 77 year old female, born October 14, 1920.  [name] is scheduled to undergo her first screening colonoscopy  with polypectomy to prevent colon cancer.  She does have constipation.   ENDOSCOPIST:  Earle Gell, M.D.   PREMEDICATION:  1. Demerol 40 mg.  2. Versed 5 mg.   DESCRIPTION OF PROCEDURE:  After obtaining informed consent, [name] was  placed in the left lateral decubitus position.  I administered intravenous  Demerol and intravenous Versed to achieve conscious sedation for the  procedure.  The patient's blood pressure, oxygen saturation, and cardiac  rhythm were monitored throughout the procedure and documented in the medical  record.   Anal inspection and digital rectal exam were normal.  The Olympus adjustable  pediatric colonoscope was introduced into the rectum and advanced to the  cecum.  Colonic preparation for the exam today was excellent.   [name] has universal colonic diverticulosis without diverticulitis or  diverticular stricture formation.   RECTUM:  Normal.  SIGMOID COLON AND DESCENDING COLON:  Normal.  SPLENIC FLEXURE:  Normal.  TRANSVERSE COLON:  From the distal transverse colon, a 1 mm sessile polyp  was removed with the cold biopsy forceps.  HEPATIC FLEXURE:  Normal.  ASCENDING COLON:  From the mid ascending colon, a 2 mm sessile polyp was  removed with electrocautery snare, and a 1 mm sessile polyp was removed with  the cold biopsy  forceps.  CECUM AND ILEOCECAL VALVE:  Normal.   ASSESSMENT:  1. Two small polyps were removed from the mid ascending colon, and a small     polyp was removed from the distal     transverse colon.  All polyps were submitted in one bottle for     pathological evaluation.  2. Universal colonic diverticulosis.   RECOMMENDATIONS:  Repeat colonoscopy in 5 years if polyps return neoplastic  pathologically.                                               Earle Gell, M.D.    MJ/MEDQ  D:  08/09/2003  T:  08/09/2003  Job:  [id number]   cc:   Doree Albee, M.D.  [place] [address]  [place]  [place] [address]  Fax: [phone]

## 2011-06-17 ENCOUNTER — Other Ambulatory Visit: Payer: Self-pay | Admitting: Internal Medicine

## 2011-06-17 DIAGNOSIS — Z1231 Encounter for screening mammogram for malignant neoplasm of breast: Secondary | ICD-10-CM

## 2011-07-24 ENCOUNTER — Ambulatory Visit
Admission: RE | Admit: 2011-07-24 | Discharge: 2011-07-24 | Disposition: A | Discharge status: Home / Self Care | Payer: Medicare Other | Source: Ambulatory Visit | Attending: Internal Medicine | Admitting: Internal Medicine

## 2011-07-24 DIAGNOSIS — Z1231 Encounter for screening mammogram for malignant neoplasm of breast: Secondary | ICD-10-CM

## 2012-06-29 ENCOUNTER — Other Ambulatory Visit: Payer: Self-pay

## 2012-06-29 DIAGNOSIS — Z1231 Encounter for screening mammogram for malignant neoplasm of breast: Secondary | ICD-10-CM

## 2012-07-26 ENCOUNTER — Ambulatory Visit: Payer: Medicare Other

## 2012-08-16 ENCOUNTER — Ambulatory Visit
Admission: RE | Admit: 2012-08-16 | Discharge: 2012-08-16 | Disposition: A | Discharge status: Home / Self Care | Payer: Medicare Other | Source: Ambulatory Visit

## 2012-08-16 DIAGNOSIS — Z1231 Encounter for screening mammogram for malignant neoplasm of breast: Secondary | ICD-10-CM

## 2013-07-18 ENCOUNTER — Other Ambulatory Visit: Payer: Self-pay

## 2013-07-18 DIAGNOSIS — Z1231 Encounter for screening mammogram for malignant neoplasm of breast: Secondary | ICD-10-CM

## 2013-08-18 ENCOUNTER — Encounter (INDEPENDENT_AMBULATORY_CARE_PROVIDER_SITE_OTHER): Payer: Self-pay

## 2013-08-18 ENCOUNTER — Ambulatory Visit
Admission: RE | Admit: 2013-08-18 | Discharge: 2013-08-18 | Disposition: A | Discharge status: Home / Self Care | Payer: Medicare Other | Source: Ambulatory Visit

## 2013-08-18 DIAGNOSIS — Z1231 Encounter for screening mammogram for malignant neoplasm of breast: Secondary | ICD-10-CM

## 2013-12-08 ENCOUNTER — Encounter (INDEPENDENT_AMBULATORY_CARE_PROVIDER_SITE_OTHER): Payer: Medicare Other | Admitting: Ophthalmology

## 2013-12-08 DIAGNOSIS — I10 Essential (primary) hypertension: Secondary | ICD-10-CM

## 2013-12-08 DIAGNOSIS — H3530 Unspecified macular degeneration: Secondary | ICD-10-CM

## 2013-12-08 DIAGNOSIS — H43819 Vitreous degeneration, unspecified eye: Secondary | ICD-10-CM

## 2013-12-08 DIAGNOSIS — H35039 Hypertensive retinopathy, unspecified eye: Secondary | ICD-10-CM

## 2013-12-08 DIAGNOSIS — H353290 Exudative age-related macular degeneration, unspecified eye, stage unspecified: Secondary | ICD-10-CM

## 2014-01-05 ENCOUNTER — Encounter (INDEPENDENT_AMBULATORY_CARE_PROVIDER_SITE_OTHER): Payer: Medicare Other | Admitting: Ophthalmology

## 2014-01-05 DIAGNOSIS — H3531 Nonexudative age-related macular degeneration: Secondary | ICD-10-CM

## 2014-01-05 DIAGNOSIS — H43813 Vitreous degeneration, bilateral: Secondary | ICD-10-CM

## 2014-01-05 DIAGNOSIS — H35033 Hypertensive retinopathy, bilateral: Secondary | ICD-10-CM

## 2014-01-05 DIAGNOSIS — H3532 Exudative age-related macular degeneration: Secondary | ICD-10-CM

## 2014-01-05 DIAGNOSIS — I10 Essential (primary) hypertension: Secondary | ICD-10-CM

## 2014-02-02 ENCOUNTER — Encounter (INDEPENDENT_AMBULATORY_CARE_PROVIDER_SITE_OTHER): Payer: Medicare Other | Admitting: Ophthalmology

## 2014-02-02 DIAGNOSIS — H3532 Exudative age-related macular degeneration: Secondary | ICD-10-CM

## 2014-02-02 DIAGNOSIS — H3531 Nonexudative age-related macular degeneration: Secondary | ICD-10-CM

## 2014-02-02 DIAGNOSIS — I10 Essential (primary) hypertension: Secondary | ICD-10-CM

## 2014-02-02 DIAGNOSIS — H35033 Hypertensive retinopathy, bilateral: Secondary | ICD-10-CM

## 2014-02-02 DIAGNOSIS — H43813 Vitreous degeneration, bilateral: Secondary | ICD-10-CM

## 2014-03-09 ENCOUNTER — Encounter (INDEPENDENT_AMBULATORY_CARE_PROVIDER_SITE_OTHER): Payer: Medicare Other | Admitting: Ophthalmology

## 2014-03-09 DIAGNOSIS — H3531 Nonexudative age-related macular degeneration: Secondary | ICD-10-CM

## 2014-03-09 DIAGNOSIS — H43813 Vitreous degeneration, bilateral: Secondary | ICD-10-CM

## 2014-03-09 DIAGNOSIS — H35033 Hypertensive retinopathy, bilateral: Secondary | ICD-10-CM

## 2014-03-09 DIAGNOSIS — H3532 Exudative age-related macular degeneration: Secondary | ICD-10-CM

## 2014-03-09 DIAGNOSIS — I10 Essential (primary) hypertension: Secondary | ICD-10-CM

## 2014-04-26 ENCOUNTER — Encounter (INDEPENDENT_AMBULATORY_CARE_PROVIDER_SITE_OTHER): Payer: Medicare Other | Admitting: Ophthalmology

## 2014-04-26 DIAGNOSIS — H43813 Vitreous degeneration, bilateral: Secondary | ICD-10-CM

## 2014-04-26 DIAGNOSIS — I10 Essential (primary) hypertension: Secondary | ICD-10-CM

## 2014-04-26 DIAGNOSIS — H35033 Hypertensive retinopathy, bilateral: Secondary | ICD-10-CM

## 2014-04-26 DIAGNOSIS — H3531 Nonexudative age-related macular degeneration: Secondary | ICD-10-CM

## 2014-04-26 DIAGNOSIS — H3532 Exudative age-related macular degeneration: Secondary | ICD-10-CM

## 2014-06-08 ENCOUNTER — Encounter (INDEPENDENT_AMBULATORY_CARE_PROVIDER_SITE_OTHER): Payer: Medicare Other | Admitting: Ophthalmology

## 2014-06-08 DIAGNOSIS — H3531 Nonexudative age-related macular degeneration: Secondary | ICD-10-CM

## 2014-06-08 DIAGNOSIS — H35033 Hypertensive retinopathy, bilateral: Secondary | ICD-10-CM | POA: Diagnosis not present

## 2014-06-08 DIAGNOSIS — H3532 Exudative age-related macular degeneration: Secondary | ICD-10-CM | POA: Diagnosis not present

## 2014-06-08 DIAGNOSIS — H43813 Vitreous degeneration, bilateral: Secondary | ICD-10-CM

## 2014-06-08 DIAGNOSIS — I10 Essential (primary) hypertension: Secondary | ICD-10-CM | POA: Diagnosis not present

## 2014-07-20 ENCOUNTER — Encounter (INDEPENDENT_AMBULATORY_CARE_PROVIDER_SITE_OTHER): Payer: Medicare Other | Admitting: Ophthalmology

## 2014-07-20 DIAGNOSIS — H3532 Exudative age-related macular degeneration: Secondary | ICD-10-CM

## 2014-07-20 DIAGNOSIS — H43813 Vitreous degeneration, bilateral: Secondary | ICD-10-CM

## 2014-07-20 DIAGNOSIS — H35033 Hypertensive retinopathy, bilateral: Secondary | ICD-10-CM

## 2014-07-20 DIAGNOSIS — H3531 Nonexudative age-related macular degeneration: Secondary | ICD-10-CM | POA: Diagnosis not present

## 2014-07-20 DIAGNOSIS — I10 Essential (primary) hypertension: Secondary | ICD-10-CM

## 2014-08-03 ENCOUNTER — Other Ambulatory Visit: Payer: Self-pay

## 2014-08-03 DIAGNOSIS — Z1231 Encounter for screening mammogram for malignant neoplasm of breast: Secondary | ICD-10-CM

## 2014-08-22 ENCOUNTER — Ambulatory Visit
Admission: RE | Admit: 2014-08-22 | Discharge: 2014-08-22 | Disposition: A | Discharge status: Home / Self Care | Payer: Medicare Other | Source: Ambulatory Visit

## 2014-08-22 DIAGNOSIS — Z1231 Encounter for screening mammogram for malignant neoplasm of breast: Secondary | ICD-10-CM

## 2014-09-07 ENCOUNTER — Encounter (INDEPENDENT_AMBULATORY_CARE_PROVIDER_SITE_OTHER): Payer: Medicare Other | Admitting: Ophthalmology

## 2014-09-07 DIAGNOSIS — H3531 Nonexudative age-related macular degeneration: Secondary | ICD-10-CM | POA: Diagnosis not present

## 2014-09-07 DIAGNOSIS — H3532 Exudative age-related macular degeneration: Secondary | ICD-10-CM

## 2014-09-07 DIAGNOSIS — H35033 Hypertensive retinopathy, bilateral: Secondary | ICD-10-CM | POA: Diagnosis not present

## 2014-09-07 DIAGNOSIS — H43813 Vitreous degeneration, bilateral: Secondary | ICD-10-CM | POA: Diagnosis not present

## 2014-09-07 DIAGNOSIS — I10 Essential (primary) hypertension: Secondary | ICD-10-CM | POA: Diagnosis not present

## 2014-10-26 ENCOUNTER — Encounter (INDEPENDENT_AMBULATORY_CARE_PROVIDER_SITE_OTHER): Payer: Medicare Other | Admitting: Ophthalmology

## 2014-10-26 DIAGNOSIS — I10 Essential (primary) hypertension: Secondary | ICD-10-CM | POA: Diagnosis not present

## 2014-10-26 DIAGNOSIS — H43813 Vitreous degeneration, bilateral: Secondary | ICD-10-CM | POA: Diagnosis not present

## 2014-10-26 DIAGNOSIS — H35033 Hypertensive retinopathy, bilateral: Secondary | ICD-10-CM | POA: Diagnosis not present

## 2014-10-26 DIAGNOSIS — H3531 Nonexudative age-related macular degeneration: Secondary | ICD-10-CM

## 2014-10-26 DIAGNOSIS — H3532 Exudative age-related macular degeneration: Secondary | ICD-10-CM

## 2014-12-28 ENCOUNTER — Encounter (INDEPENDENT_AMBULATORY_CARE_PROVIDER_SITE_OTHER): Payer: Medicare Other | Admitting: Ophthalmology

## 2014-12-28 DIAGNOSIS — H3531 Nonexudative age-related macular degeneration: Secondary | ICD-10-CM

## 2014-12-28 DIAGNOSIS — H3532 Exudative age-related macular degeneration: Secondary | ICD-10-CM | POA: Diagnosis not present

## 2014-12-28 DIAGNOSIS — H43813 Vitreous degeneration, bilateral: Secondary | ICD-10-CM | POA: Diagnosis not present

## 2014-12-28 DIAGNOSIS — I10 Essential (primary) hypertension: Secondary | ICD-10-CM | POA: Diagnosis not present

## 2014-12-28 DIAGNOSIS — H35033 Hypertensive retinopathy, bilateral: Secondary | ICD-10-CM | POA: Diagnosis not present

## 2015-02-26 ENCOUNTER — Encounter (INDEPENDENT_AMBULATORY_CARE_PROVIDER_SITE_OTHER): Payer: Medicare Other | Admitting: Ophthalmology

## 2015-02-26 DIAGNOSIS — H43813 Vitreous degeneration, bilateral: Secondary | ICD-10-CM | POA: Diagnosis not present

## 2015-02-26 DIAGNOSIS — H353221 Exudative age-related macular degeneration, left eye, with active choroidal neovascularization: Secondary | ICD-10-CM

## 2015-02-26 DIAGNOSIS — H35033 Hypertensive retinopathy, bilateral: Secondary | ICD-10-CM

## 2015-02-26 DIAGNOSIS — H353112 Nonexudative age-related macular degeneration, right eye, intermediate dry stage: Secondary | ICD-10-CM

## 2015-02-27 ENCOUNTER — Encounter (INDEPENDENT_AMBULATORY_CARE_PROVIDER_SITE_OTHER): Payer: Medicare Other | Admitting: Ophthalmology

## 2015-04-30 ENCOUNTER — Encounter (INDEPENDENT_AMBULATORY_CARE_PROVIDER_SITE_OTHER): Payer: Medicare Other | Admitting: Ophthalmology

## 2015-04-30 DIAGNOSIS — H43813 Vitreous degeneration, bilateral: Secondary | ICD-10-CM

## 2015-04-30 DIAGNOSIS — H35033 Hypertensive retinopathy, bilateral: Secondary | ICD-10-CM | POA: Diagnosis not present

## 2015-04-30 DIAGNOSIS — I10 Essential (primary) hypertension: Secondary | ICD-10-CM | POA: Diagnosis not present

## 2015-04-30 DIAGNOSIS — H353221 Exudative age-related macular degeneration, left eye, with active choroidal neovascularization: Secondary | ICD-10-CM | POA: Diagnosis not present

## 2015-04-30 DIAGNOSIS — H353112 Nonexudative age-related macular degeneration, right eye, intermediate dry stage: Secondary | ICD-10-CM

## 2015-07-02 ENCOUNTER — Encounter (INDEPENDENT_AMBULATORY_CARE_PROVIDER_SITE_OTHER): Payer: Medicare Other | Admitting: Ophthalmology

## 2015-07-02 DIAGNOSIS — I10 Essential (primary) hypertension: Secondary | ICD-10-CM | POA: Diagnosis not present

## 2015-07-02 DIAGNOSIS — H43813 Vitreous degeneration, bilateral: Secondary | ICD-10-CM | POA: Diagnosis not present

## 2015-07-02 DIAGNOSIS — H353112 Nonexudative age-related macular degeneration, right eye, intermediate dry stage: Secondary | ICD-10-CM

## 2015-07-02 DIAGNOSIS — H353221 Exudative age-related macular degeneration, left eye, with active choroidal neovascularization: Secondary | ICD-10-CM

## 2015-07-02 DIAGNOSIS — H35033 Hypertensive retinopathy, bilateral: Secondary | ICD-10-CM

## 2015-07-19 ENCOUNTER — Other Ambulatory Visit: Payer: Self-pay

## 2015-07-19 DIAGNOSIS — Z1231 Encounter for screening mammogram for malignant neoplasm of breast: Secondary | ICD-10-CM

## 2015-08-24 ENCOUNTER — Ambulatory Visit
Admission: RE | Admit: 2015-08-24 | Discharge: 2015-08-24 | Disposition: A | Discharge status: Home / Self Care | Payer: Medicare Other | Source: Ambulatory Visit

## 2015-08-24 DIAGNOSIS — Z1231 Encounter for screening mammogram for malignant neoplasm of breast: Secondary | ICD-10-CM

## 2015-09-17 ENCOUNTER — Encounter (INDEPENDENT_AMBULATORY_CARE_PROVIDER_SITE_OTHER): Payer: Medicare Other | Admitting: Ophthalmology

## 2015-09-17 DIAGNOSIS — H353221 Exudative age-related macular degeneration, left eye, with active choroidal neovascularization: Secondary | ICD-10-CM

## 2015-09-17 DIAGNOSIS — I10 Essential (primary) hypertension: Secondary | ICD-10-CM

## 2015-09-17 DIAGNOSIS — H353112 Nonexudative age-related macular degeneration, right eye, intermediate dry stage: Secondary | ICD-10-CM

## 2015-09-17 DIAGNOSIS — H35033 Hypertensive retinopathy, bilateral: Secondary | ICD-10-CM

## 2015-09-17 DIAGNOSIS — H43813 Vitreous degeneration, bilateral: Secondary | ICD-10-CM

## 2015-12-03 ENCOUNTER — Encounter (INDEPENDENT_AMBULATORY_CARE_PROVIDER_SITE_OTHER): Payer: Medicare Other | Admitting: Ophthalmology

## 2015-12-03 DIAGNOSIS — H353112 Nonexudative age-related macular degeneration, right eye, intermediate dry stage: Secondary | ICD-10-CM

## 2015-12-03 DIAGNOSIS — H43813 Vitreous degeneration, bilateral: Secondary | ICD-10-CM | POA: Diagnosis not present

## 2015-12-03 DIAGNOSIS — H353221 Exudative age-related macular degeneration, left eye, with active choroidal neovascularization: Secondary | ICD-10-CM

## 2015-12-03 DIAGNOSIS — H35033 Hypertensive retinopathy, bilateral: Secondary | ICD-10-CM

## 2015-12-03 DIAGNOSIS — I10 Essential (primary) hypertension: Secondary | ICD-10-CM

## 2016-02-25 ENCOUNTER — Encounter (INDEPENDENT_AMBULATORY_CARE_PROVIDER_SITE_OTHER): Payer: Medicare Other | Admitting: Ophthalmology

## 2016-02-25 DIAGNOSIS — I10 Essential (primary) hypertension: Secondary | ICD-10-CM | POA: Diagnosis not present

## 2016-02-25 DIAGNOSIS — H353221 Exudative age-related macular degeneration, left eye, with active choroidal neovascularization: Secondary | ICD-10-CM | POA: Diagnosis not present

## 2016-02-25 DIAGNOSIS — H35033 Hypertensive retinopathy, bilateral: Secondary | ICD-10-CM | POA: Diagnosis not present

## 2016-02-25 DIAGNOSIS — H353112 Nonexudative age-related macular degeneration, right eye, intermediate dry stage: Secondary | ICD-10-CM

## 2016-02-25 DIAGNOSIS — H43813 Vitreous degeneration, bilateral: Secondary | ICD-10-CM

## 2016-05-19 ENCOUNTER — Encounter (INDEPENDENT_AMBULATORY_CARE_PROVIDER_SITE_OTHER): Payer: Medicare Other | Admitting: Ophthalmology

## 2016-05-19 DIAGNOSIS — H35033 Hypertensive retinopathy, bilateral: Secondary | ICD-10-CM | POA: Diagnosis not present

## 2016-05-19 DIAGNOSIS — I10 Essential (primary) hypertension: Secondary | ICD-10-CM

## 2016-05-19 DIAGNOSIS — H353112 Nonexudative age-related macular degeneration, right eye, intermediate dry stage: Secondary | ICD-10-CM | POA: Diagnosis not present

## 2016-05-19 DIAGNOSIS — H43813 Vitreous degeneration, bilateral: Secondary | ICD-10-CM

## 2016-05-19 DIAGNOSIS — H353221 Exudative age-related macular degeneration, left eye, with active choroidal neovascularization: Secondary | ICD-10-CM | POA: Diagnosis not present

## 2016-07-10 ENCOUNTER — Other Ambulatory Visit: Payer: Self-pay | Admitting: Internal Medicine

## 2016-07-10 DIAGNOSIS — M8589 Other specified disorders of bone density and structure, multiple sites: Secondary | ICD-10-CM

## 2016-08-25 ENCOUNTER — Encounter (INDEPENDENT_AMBULATORY_CARE_PROVIDER_SITE_OTHER): Payer: Medicare Other | Admitting: Ophthalmology

## 2016-08-26 ENCOUNTER — Other Ambulatory Visit: Payer: Medicare Other

## 2016-08-26 ENCOUNTER — Inpatient Hospital Stay: Admission: RE | Admit: 2016-08-26 | Payer: Medicare Other | Source: Ambulatory Visit

## 2016-09-15 ENCOUNTER — Encounter (INDEPENDENT_AMBULATORY_CARE_PROVIDER_SITE_OTHER): Payer: Medicare Other | Admitting: Ophthalmology

## 2016-09-15 DIAGNOSIS — H353221 Exudative age-related macular degeneration, left eye, with active choroidal neovascularization: Secondary | ICD-10-CM | POA: Diagnosis not present

## 2016-09-15 DIAGNOSIS — H35033 Hypertensive retinopathy, bilateral: Secondary | ICD-10-CM | POA: Diagnosis not present

## 2016-09-15 DIAGNOSIS — H353112 Nonexudative age-related macular degeneration, right eye, intermediate dry stage: Secondary | ICD-10-CM | POA: Diagnosis not present

## 2016-09-15 DIAGNOSIS — H43813 Vitreous degeneration, bilateral: Secondary | ICD-10-CM

## 2016-09-15 DIAGNOSIS — I10 Essential (primary) hypertension: Secondary | ICD-10-CM

## 2017-01-05 ENCOUNTER — Encounter (INDEPENDENT_AMBULATORY_CARE_PROVIDER_SITE_OTHER): Payer: Medicare Other | Admitting: Ophthalmology

## 2017-01-05 DIAGNOSIS — H43813 Vitreous degeneration, bilateral: Secondary | ICD-10-CM

## 2017-01-05 DIAGNOSIS — I10 Essential (primary) hypertension: Secondary | ICD-10-CM | POA: Diagnosis not present

## 2017-01-05 DIAGNOSIS — H353221 Exudative age-related macular degeneration, left eye, with active choroidal neovascularization: Secondary | ICD-10-CM

## 2017-01-05 DIAGNOSIS — H35033 Hypertensive retinopathy, bilateral: Secondary | ICD-10-CM

## 2017-01-05 DIAGNOSIS — H353112 Nonexudative age-related macular degeneration, right eye, intermediate dry stage: Secondary | ICD-10-CM | POA: Diagnosis not present

## 2017-04-20 ENCOUNTER — Encounter (INDEPENDENT_AMBULATORY_CARE_PROVIDER_SITE_OTHER): Payer: Medicare Other | Admitting: Ophthalmology

## 2017-05-05 ENCOUNTER — Encounter (INDEPENDENT_AMBULATORY_CARE_PROVIDER_SITE_OTHER): Payer: Medicare Other | Admitting: Ophthalmology

## 2017-05-05 DIAGNOSIS — H353112 Nonexudative age-related macular degeneration, right eye, intermediate dry stage: Secondary | ICD-10-CM | POA: Diagnosis not present

## 2017-05-05 DIAGNOSIS — H43813 Vitreous degeneration, bilateral: Secondary | ICD-10-CM | POA: Diagnosis not present

## 2017-05-05 DIAGNOSIS — H353221 Exudative age-related macular degeneration, left eye, with active choroidal neovascularization: Secondary | ICD-10-CM

## 2017-05-05 DIAGNOSIS — H35033 Hypertensive retinopathy, bilateral: Secondary | ICD-10-CM | POA: Diagnosis not present

## 2017-05-05 DIAGNOSIS — I10 Essential (primary) hypertension: Secondary | ICD-10-CM | POA: Diagnosis not present

## 2017-08-07 ENCOUNTER — Encounter (INDEPENDENT_AMBULATORY_CARE_PROVIDER_SITE_OTHER): Payer: Medicare Other | Admitting: Ophthalmology

## 2017-08-10 ENCOUNTER — Encounter (INDEPENDENT_AMBULATORY_CARE_PROVIDER_SITE_OTHER): Payer: Medicare Other | Admitting: Ophthalmology

## 2017-08-21 ENCOUNTER — Encounter (INDEPENDENT_AMBULATORY_CARE_PROVIDER_SITE_OTHER): Payer: Medicare Other | Admitting: Ophthalmology

## 2019-02-21 ENCOUNTER — Encounter (INDEPENDENT_AMBULATORY_CARE_PROVIDER_SITE_OTHER): Payer: Medicare Other | Admitting: Ophthalmology

## 2019-02-28 ENCOUNTER — Encounter (INDEPENDENT_AMBULATORY_CARE_PROVIDER_SITE_OTHER): Payer: Medicare Other | Admitting: Ophthalmology

## 2019-02-28 ENCOUNTER — Other Ambulatory Visit: Payer: Self-pay

## 2019-02-28 DIAGNOSIS — H353221 Exudative age-related macular degeneration, left eye, with active choroidal neovascularization: Secondary | ICD-10-CM | POA: Diagnosis not present

## 2019-02-28 DIAGNOSIS — I10 Essential (primary) hypertension: Secondary | ICD-10-CM | POA: Diagnosis not present

## 2019-02-28 DIAGNOSIS — H353112 Nonexudative age-related macular degeneration, right eye, intermediate dry stage: Secondary | ICD-10-CM | POA: Diagnosis not present

## 2019-02-28 DIAGNOSIS — H35033 Hypertensive retinopathy, bilateral: Secondary | ICD-10-CM | POA: Diagnosis not present

## 2019-02-28 DIAGNOSIS — H43813 Vitreous degeneration, bilateral: Secondary | ICD-10-CM

## 2019-05-01 ENCOUNTER — Ambulatory Visit: Payer: Medicare Other | Attending: Internal Medicine

## 2019-05-01 DIAGNOSIS — Z23 Encounter for immunization: Secondary | ICD-10-CM

## 2019-05-02 NOTE — Progress Notes (Signed)
   Covid-19 Vaccination Clinic  Name:  [name]    MRN: [id number] DOB: 10/14/1920  05/01/2019  [name] was observed post Covid-19 immunization for 15 minutes without incidence. She was provided with Vaccine Information Sheet and instruction to access the V-Safe system.   [name] was instructed to call 911 with any severe reactions post vaccine: Marland Kitchen Difficulty breathing  . Swelling of your face and throat  . A fast heartbeat  . A bad rash all over your body  . Dizziness and weakness    Immunizations Administered    Name Date Dose VIS Date Route   Moderna COVID-19 Vaccine 05/01/2019  3:17 PM 0.5 mL 03/08/2019 Intramuscular   Manufacturer: Levan Hurst   Lot: [id number]   [id number]      Documented on behalf of: Asencion Noble

## 2019-05-29 ENCOUNTER — Ambulatory Visit: Payer: Medicare Other | Attending: Internal Medicine

## 2019-05-29 DIAGNOSIS — Z23 Encounter for immunization: Secondary | ICD-10-CM | POA: Insufficient documentation

## 2019-05-29 NOTE — Progress Notes (Signed)
   Covid-19 Vaccination Clinic  Name:  Tracy [name]    MRN: [id number] DOB: 10/14/1920  05/29/2019  [name] was observed post Covid-19 immunization for 15 minutes without incidence. She was provided with Vaccine Information Sheet and instruction to access the V-Safe system.   [name] was instructed to call 911 with any severe reactions post vaccine: Marland Kitchen Difficulty breathing  . Swelling of your face and throat  . A fast heartbeat  . A bad rash all over your body  . Dizziness and weakness    Immunizations Administered    Name Date Dose VIS Date Route   Moderna COVID-19 Vaccine 05/29/2019 12:24 PM 0.5 mL 03/08/2019 Intramuscular   Manufacturer: Moderna   Lot: [id number]   [id number]

## 2020-02-06 ENCOUNTER — Other Ambulatory Visit: Payer: Self-pay | Admitting: Internal Medicine

## 2020-02-06 DIAGNOSIS — R630 Anorexia: Secondary | ICD-10-CM

## 2020-02-06 DIAGNOSIS — R634 Abnormal weight loss: Secondary | ICD-10-CM

## 2020-02-15 ENCOUNTER — Ambulatory Visit
Admission: RE | Admit: 2020-02-15 | Discharge: 2020-02-15 | Disposition: A | Discharge status: Home / Self Care | Payer: Medicare Other | Source: Ambulatory Visit | Attending: Internal Medicine | Admitting: Internal Medicine

## 2020-02-15 DIAGNOSIS — R630 Anorexia: Secondary | ICD-10-CM

## 2020-02-15 DIAGNOSIS — R634 Abnormal weight loss: Secondary | ICD-10-CM

## 2020-02-15 IMAGING — US US ABDOMEN COMPLETE
1 series · 13 of 25 positions shown · non-contrast
Comparison: None.

CLINICAL DATA: Weight loss.  Loss of appetite.

EXAM:
ABDOMEN ULTRASOUND COMPLETE

[Series 1: us abdomen complete · 0.15mm/px · 13 of 75 slices shown]
[im 1/75]
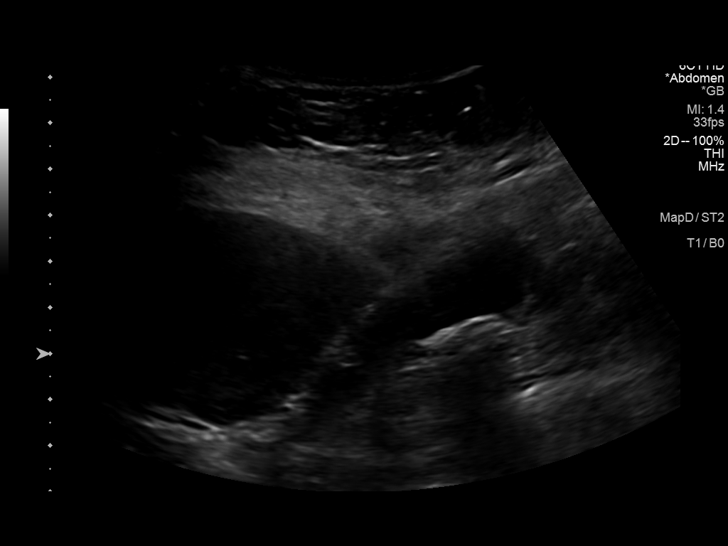
[im 7/75]
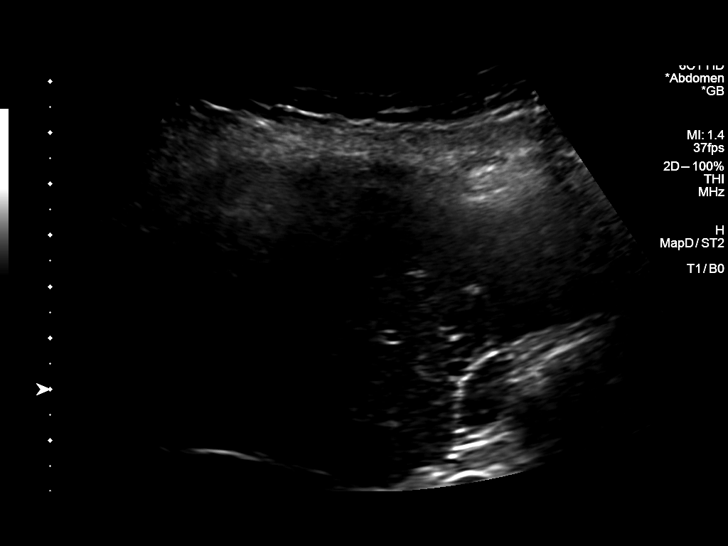
[im 13/75]
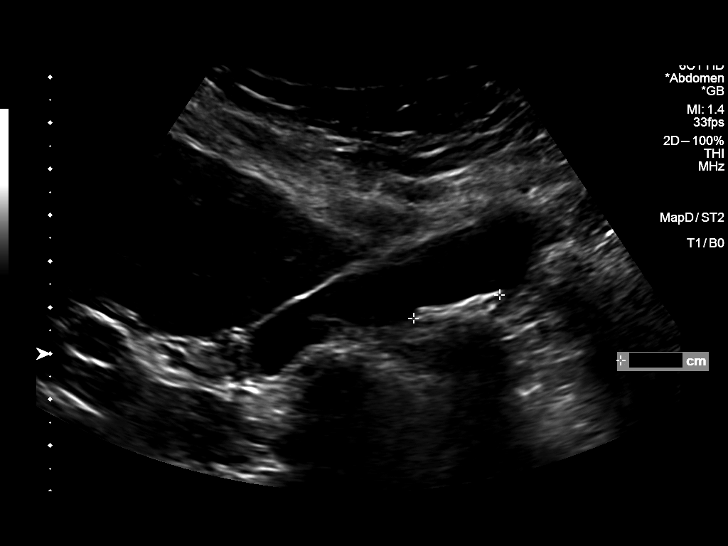
[im 19/75]
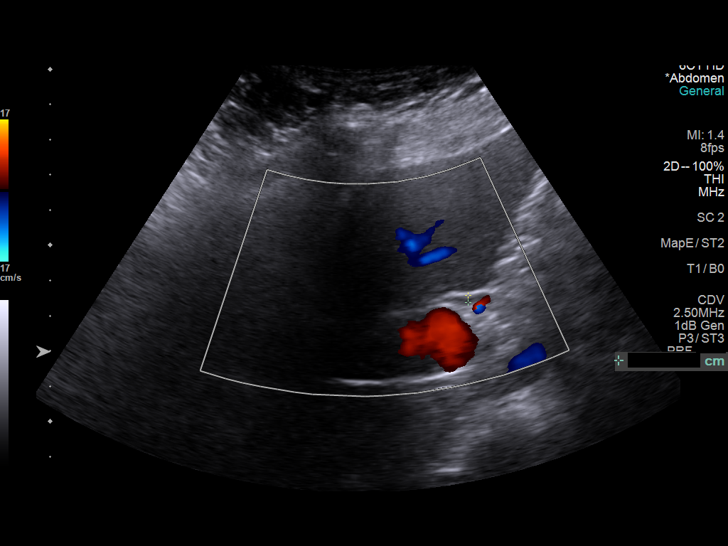
[im 25/75]
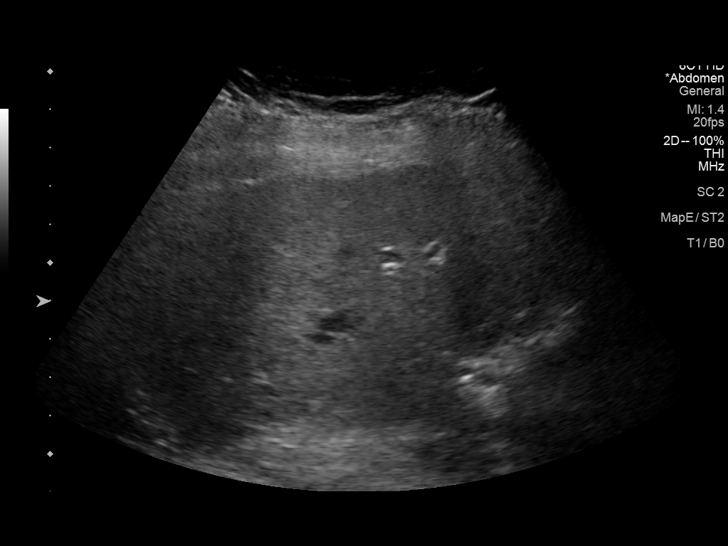
[im 31/75]
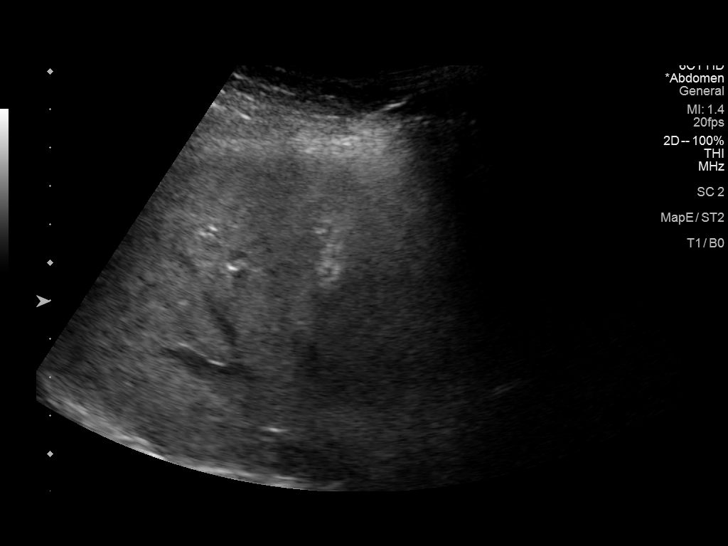
[im 38/75]
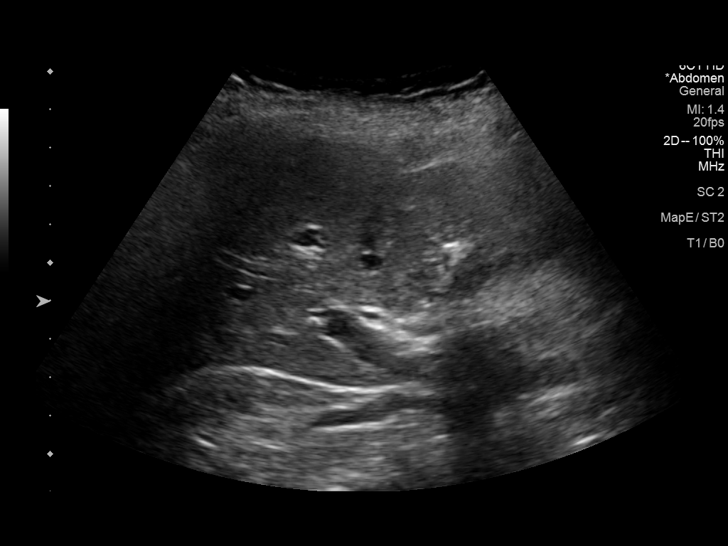
[im 44/75]
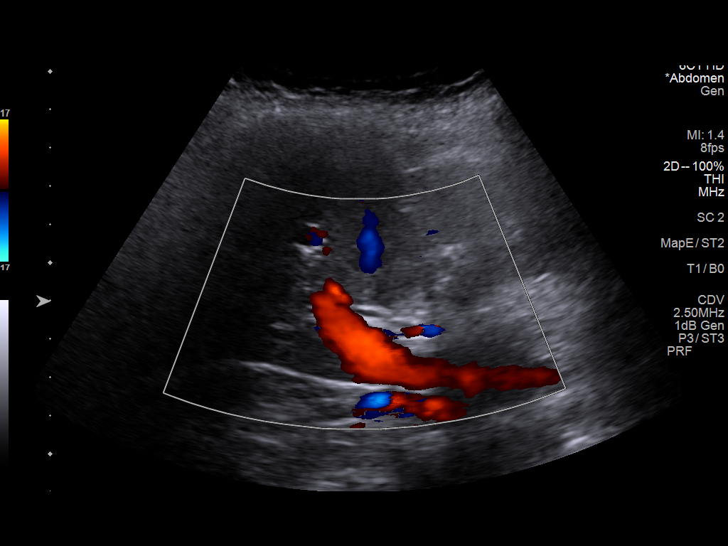
[im 50/75]
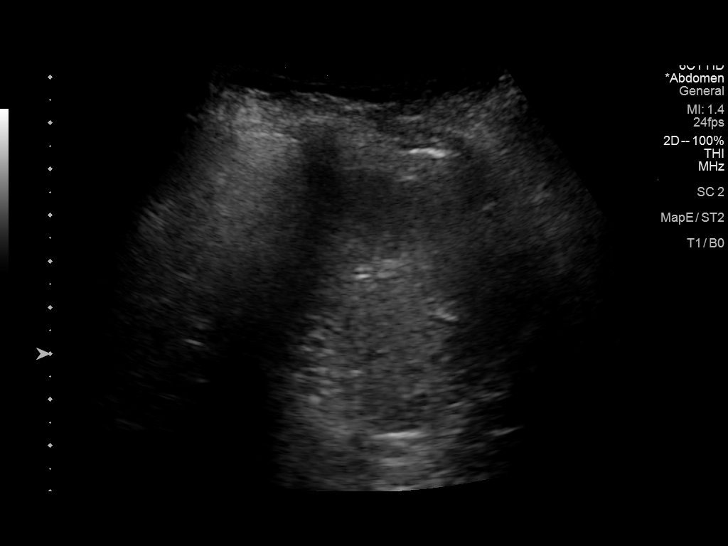
[im 56/75]
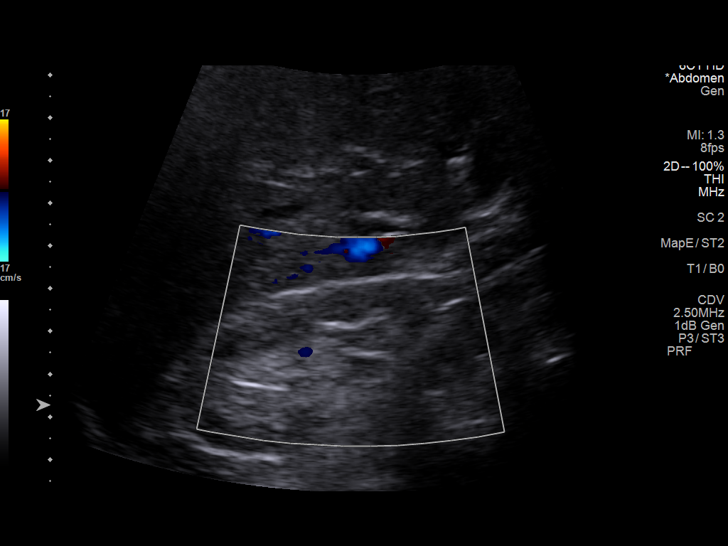
[im 62/75]
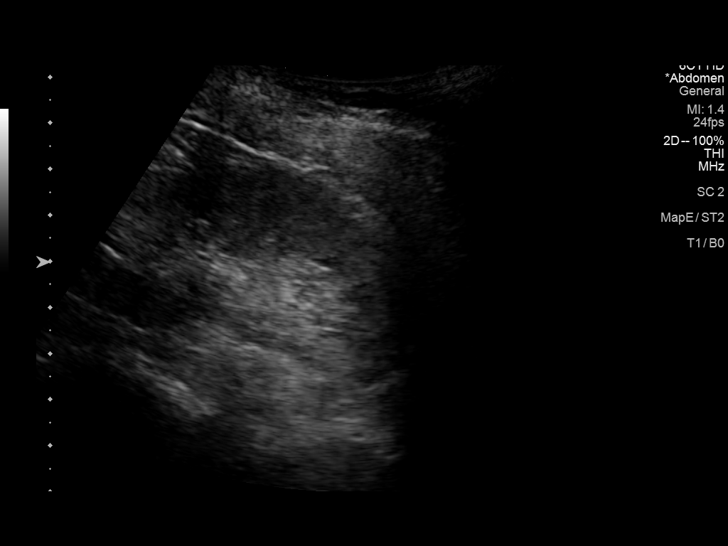
[im 68/75]
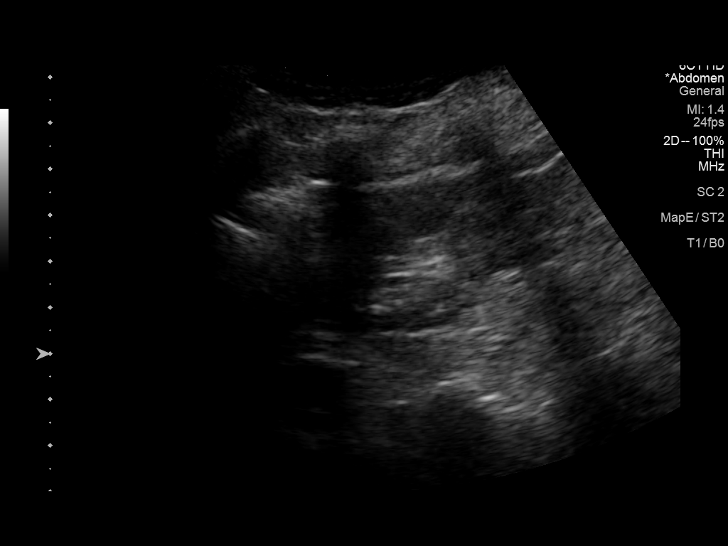
[im 75/75]
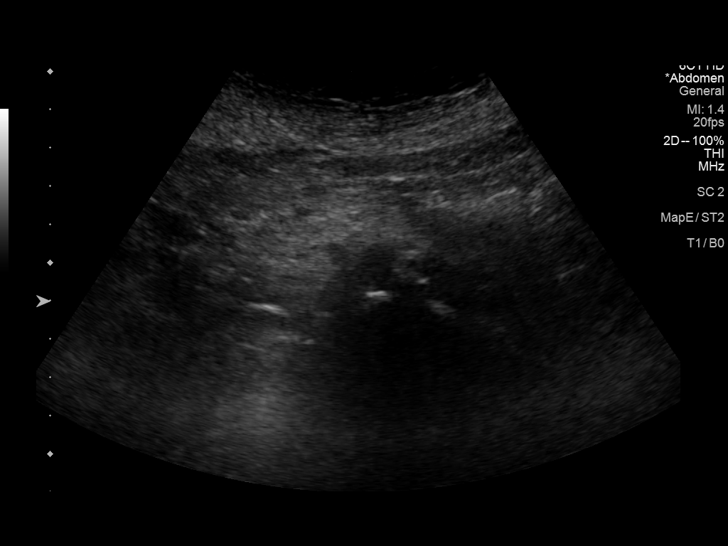

[13 of 25 positions shown; findings below may reference images not displayed]

FINDINGS: Gallbladder: Few mobile stones within the gallbladder. No Murphy
sign. No wall thickening. No surrounding fluid.

Common bile duct: Diameter: 2 mm common normal

Liver: Heterogeneous echotexture suggesting fatty change. No sign of
focal mass or ductal dilatation. Portal vein is patent on color
Doppler imaging with normal direction of blood flow towards the
liver.

IVC: Poorly seen because of overlying bowel gas.

Pancreas: Poorly seen because of overlying bowel gas.

Spleen: Small but normal.

Right Kidney: Length: 7.3 cm in length. Increased echogenicity.
Findings consistent with chronic renal parenchymal disease. No
hydronephrosis or mass.

Left Kidney: Length: 8.6 cm in length. Increased echogenicity. No
hydronephrosis. Findings consistent with chronic renal parenchymal
disease.

Abdominal aorta: Infrarenal aorta obscured by bowel gas.

Other findings: No sign of ascites.
IMPRESSION: Mobile gallstones in the gallbladder. No sonographic evidence of
cholecystitis or obstruction.

Heterogeneous echotexture of the liver consistent with fatty change.

Small echogenic kidneys consistent with chronic renal parenchymal
disease. No hydronephrosis.

Poor visualization of the pancreas, IVC and aorta because of
overlying bowel gas.

## 2020-04-07 DIAGNOSIS — I639 Cerebral infarction, unspecified: Secondary | ICD-10-CM

## 2020-04-07 HISTORY — DX: Cerebral infarction, unspecified: I63.9

## 2021-01-29 ENCOUNTER — Emergency Department (HOSPITAL_COMMUNITY): Payer: Medicare Other

## 2021-01-29 ENCOUNTER — Other Ambulatory Visit: Payer: Self-pay

## 2021-01-29 ENCOUNTER — Inpatient Hospital Stay (HOSPITAL_COMMUNITY)
Admission: EM | Admit: 2021-01-29 | Discharge: 2021-02-01 | DRG: 064 | Disposition: A | Discharge status: Home Health | Payer: Medicare Other | Attending: Internal Medicine | Admitting: Internal Medicine

## 2021-01-29 DIAGNOSIS — N189 Chronic kidney disease, unspecified: Secondary | ICD-10-CM | POA: Diagnosis present

## 2021-01-29 DIAGNOSIS — Z66 Do not resuscitate: Secondary | ICD-10-CM | POA: Diagnosis present

## 2021-01-29 DIAGNOSIS — I63433 Cerebral infarction due to embolism of bilateral posterior cerebral arteries: Principal | ICD-10-CM | POA: Diagnosis present

## 2021-01-29 DIAGNOSIS — D631 Anemia in chronic kidney disease: Secondary | ICD-10-CM | POA: Diagnosis present

## 2021-01-29 DIAGNOSIS — H539 Unspecified visual disturbance: Secondary | ICD-10-CM | POA: Diagnosis present

## 2021-01-29 DIAGNOSIS — R29810 Facial weakness: Secondary | ICD-10-CM | POA: Diagnosis present

## 2021-01-29 DIAGNOSIS — I129 Hypertensive chronic kidney disease with stage 1 through stage 4 chronic kidney disease, or unspecified chronic kidney disease: Secondary | ICD-10-CM | POA: Diagnosis present

## 2021-01-29 DIAGNOSIS — M25561 Pain in right knee: Secondary | ICD-10-CM

## 2021-01-29 DIAGNOSIS — I639 Cerebral infarction, unspecified: Secondary | ICD-10-CM

## 2021-01-29 DIAGNOSIS — F05 Delirium due to known physiological condition: Secondary | ICD-10-CM | POA: Diagnosis not present

## 2021-01-29 DIAGNOSIS — I63532 Cerebral infarction due to unspecified occlusion or stenosis of left posterior cerebral artery: Secondary | ICD-10-CM | POA: Diagnosis present

## 2021-01-29 DIAGNOSIS — Z8673 Personal history of transient ischemic attack (TIA), and cerebral infarction without residual deficits: Secondary | ICD-10-CM

## 2021-01-29 DIAGNOSIS — E782 Mixed hyperlipidemia: Secondary | ICD-10-CM | POA: Diagnosis present

## 2021-01-29 DIAGNOSIS — I671 Cerebral aneurysm, nonruptured: Secondary | ICD-10-CM | POA: Diagnosis present

## 2021-01-29 DIAGNOSIS — E785 Hyperlipidemia, unspecified: Secondary | ICD-10-CM | POA: Diagnosis present

## 2021-01-29 DIAGNOSIS — Z20822 Contact with and (suspected) exposure to covid-19: Secondary | ICD-10-CM | POA: Diagnosis present

## 2021-01-29 DIAGNOSIS — H53462 Homonymous bilateral field defects, left side: Secondary | ICD-10-CM | POA: Diagnosis present

## 2021-01-29 DIAGNOSIS — Z9181 History of falling: Secondary | ICD-10-CM

## 2021-01-29 DIAGNOSIS — N184 Chronic kidney disease, stage 4 (severe): Secondary | ICD-10-CM | POA: Diagnosis present

## 2021-01-29 DIAGNOSIS — I10 Essential (primary) hypertension: Secondary | ICD-10-CM | POA: Diagnosis present

## 2021-01-29 DIAGNOSIS — R29700 NIHSS score 0: Secondary | ICD-10-CM | POA: Diagnosis present

## 2021-01-29 DIAGNOSIS — Z7902 Long term (current) use of antithrombotics/antiplatelets: Secondary | ICD-10-CM

## 2021-01-29 DIAGNOSIS — Z79899 Other long term (current) drug therapy: Secondary | ICD-10-CM

## 2021-01-29 DIAGNOSIS — R296 Repeated falls: Secondary | ICD-10-CM | POA: Diagnosis present

## 2021-01-29 DIAGNOSIS — I619 Nontraumatic intracerebral hemorrhage, unspecified: Secondary | ICD-10-CM | POA: Diagnosis present

## 2021-01-29 LAB — COMPREHENSIVE METABOLIC PANEL
ALT: 7 U/L (ref 0–44)
AST: 25 U/L (ref 15–41)
Albumin: 3.5 g/dL (ref 3.5–5.0)
Alkaline Phosphatase: 74 U/L (ref 38–126)
Anion gap: 12 (ref 5–15)
BUN: 37 mg/dL — ABNORMAL HIGH (ref 8–23)
CO2: 22 mmol/L (ref 22–32)
Calcium: 8.8 mg/dL — ABNORMAL LOW (ref 8.9–10.3)
Chloride: 102 mmol/L (ref 98–111)
Creatinine, Ser: 2.38 mg/dL — ABNORMAL HIGH (ref 0.44–1.00)
GFR, Estimated: 18 mL/min — ABNORMAL LOW (ref >60)
Glucose, Bld: 118 mg/dL — ABNORMAL HIGH (ref 70–99)
Potassium: 3.8 mmol/L (ref 3.5–5.1)
Sodium: 136 mmol/L (ref 135–145)
Total Bilirubin: 0.8 mg/dL (ref 0.3–1.2)
Total Protein: 6.6 g/dL (ref 6.5–8.1)

## 2021-01-29 LAB — CBC WITH DIFFERENTIAL/PLATELET
Abs Immature Granulocytes: 0.06 10*3/uL (ref 0.00–0.07)
Basophils Absolute: 0 10*3/uL (ref 0.0–0.1)
Basophils Relative: 0 %
Eosinophils Absolute: 0 10*3/uL (ref 0.0–0.5)
Eosinophils Relative: 0 %
HCT: 32 % — ABNORMAL LOW (ref 36.0–46.0)
Hemoglobin: 10.5 g/dL — ABNORMAL LOW (ref 12.0–15.0)
Immature Granulocytes: 1 %
Lymphocytes Relative: 15 %
Lymphs Abs: 1.7 10*3/uL (ref 0.7–4.0)
MCH: 29.4 pg (ref 26.0–34.0)
MCHC: 32.8 g/dL (ref 30.0–36.0)
MCV: 89.6 fL (ref 80.0–100.0)
Monocytes Absolute: 0.7 10*3/uL (ref 0.1–1.0)
Monocytes Relative: 6 %
Neutro Abs: 8.7 10*3/uL — ABNORMAL HIGH (ref 1.7–7.7)
Neutrophils Relative %: 78 %
Platelets: 266 10*3/uL (ref 150–400)
RBC: 3.57 MIL/uL — ABNORMAL LOW (ref 3.87–5.11)
RDW: 13.1 % (ref 11.5–15.5)
WBC: 11.2 10*3/uL — ABNORMAL HIGH (ref 4.0–10.5)
nRBC: 0 % (ref 0.0–0.2)

## 2021-01-29 IMAGING — CT CT HEAD W/O CM
3 series · 14 of 47 positions shown, 16 images · non-contrast
Comparison: CT head report only from [DATE].

CLINICAL DATA: Head trauma.

EXAM:
CT HEAD WITHOUT CONTRAST
TECHNIQUE: Contiguous axial images were obtained from the base of the skull
through the vertex without intravenous contrast.

[Series 4: head 5.0 h30s · axial · 0.48mm/px · z∈[-139,-14]mm · 8 of 30 slices shown, 10 images]
[im 3/30  brain]
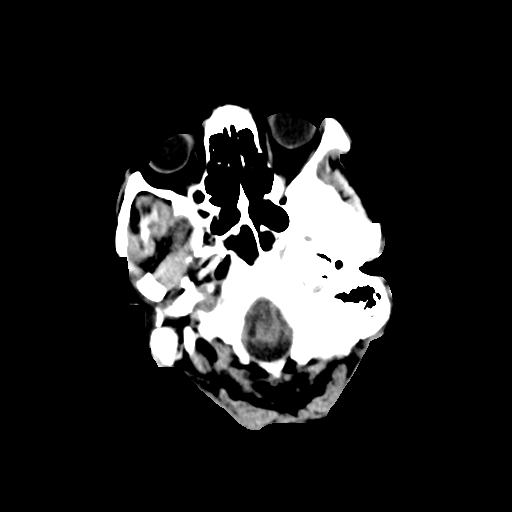
[im 3/30  bone]
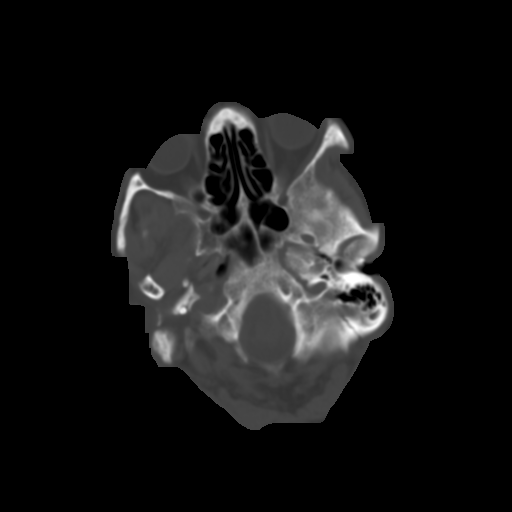
[im 7/30  brain]
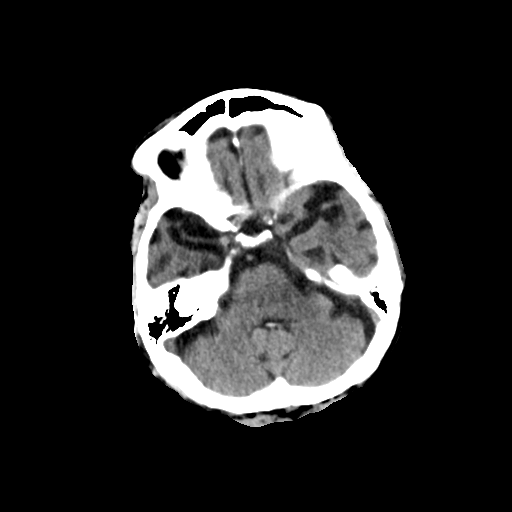
[im 10/30  brain]
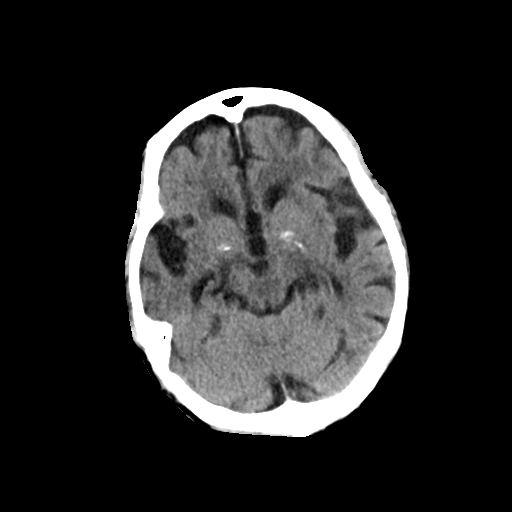
[im 14/30  brain]
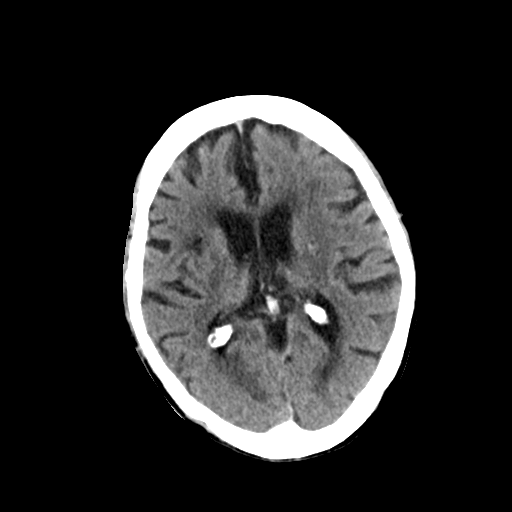
[im 17/30  brain]
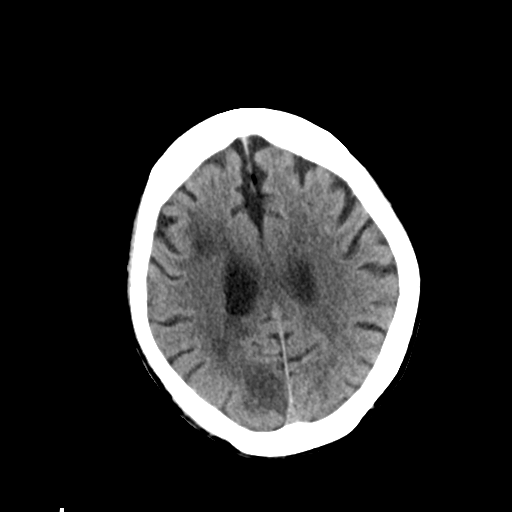
[im 17/30  bone]
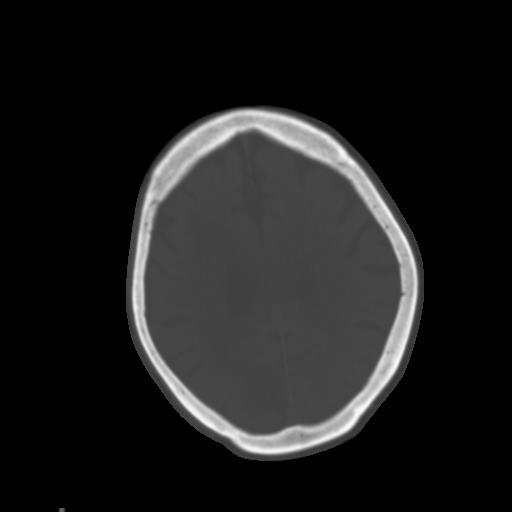
[im 21/30  brain]
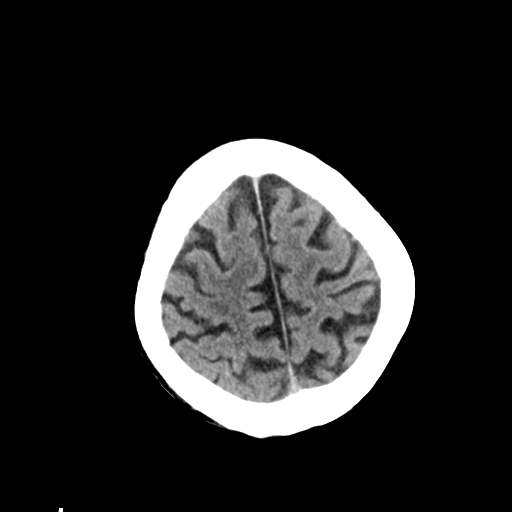
[im 24/30  brain]
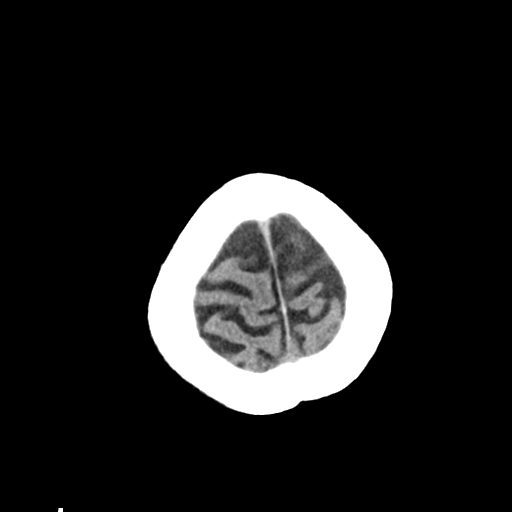
[im 28/30  brain]
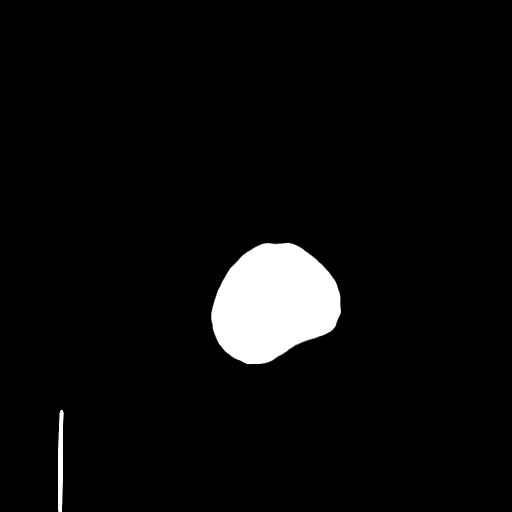

[Series 5: head 3.0 mpr cor · coronal · 0.29mm/px · 3 of 63 slices shown]
[im 21/63  brain]
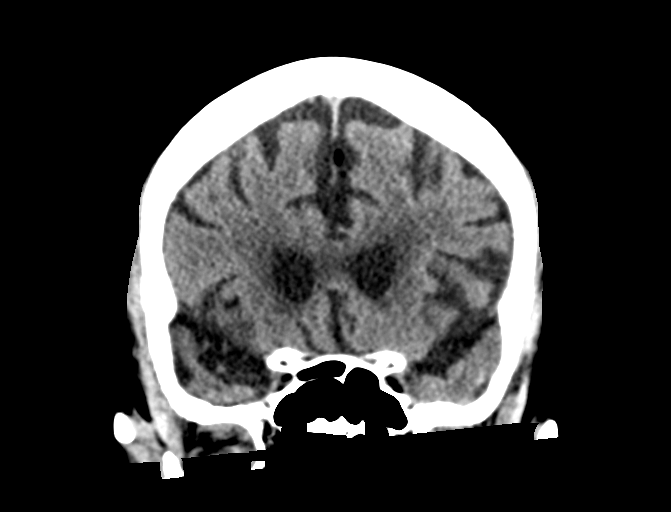
[im 28/63  brain]
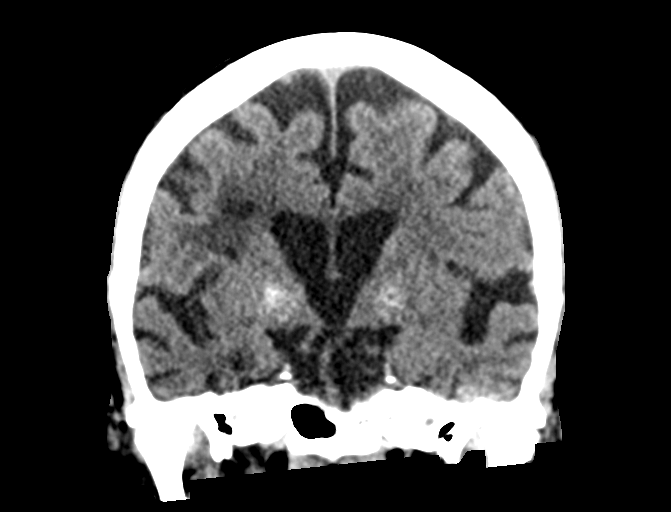
[im 35/63  brain]
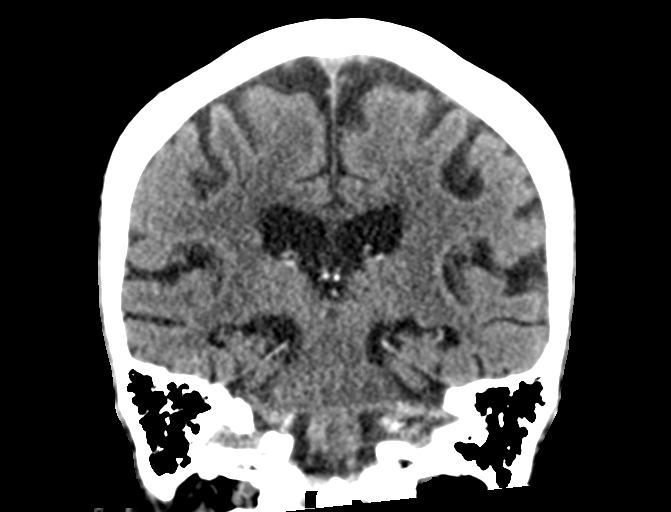

[Series 6: head 3.0 mpr sag · sagittal · 0.29mm/px · 3 of 51 slices shown]
[im 17/51  brain]
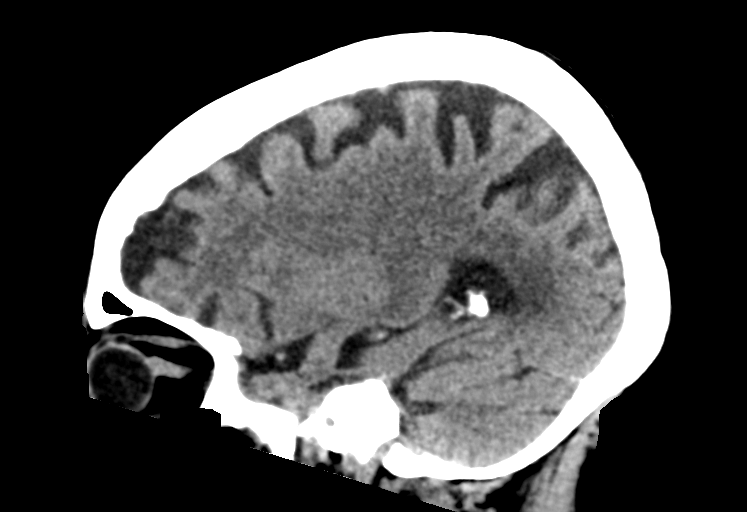
[im 26/51  brain]
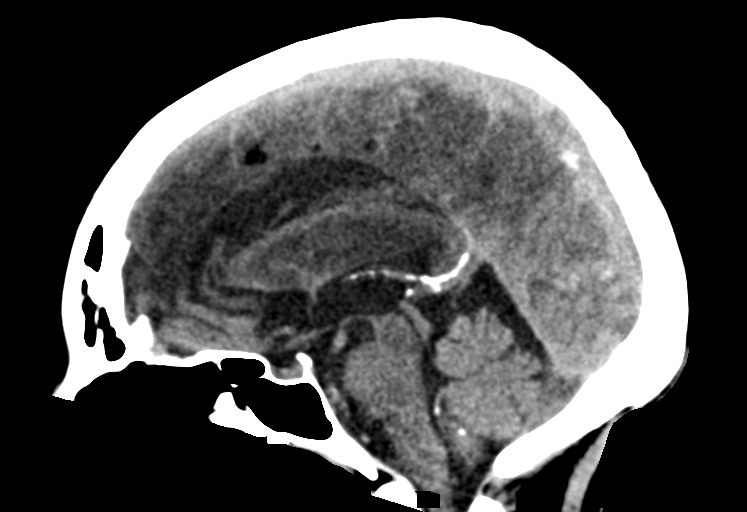
[im 34/51  brain]
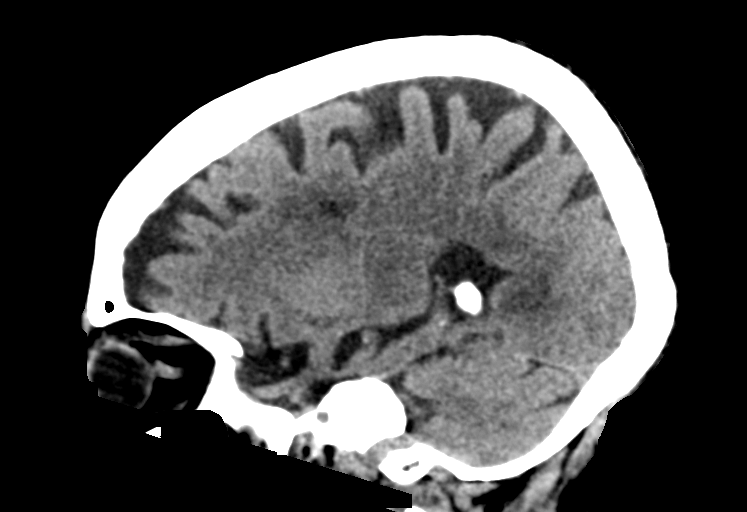

[14 of 47 positions shown; findings below may reference images not displayed]

FINDINGS: Brain: There is hypodensity with loss of gray-white matter
distinction in the right occipital region. Gray-white matter
distinction is otherwise preserved. No evidence of hemorrhage,
hydrocephalus, extra-axial collection or mass lesion/mass effect.
There is mild diffuse atrophy. There is a small old infarct in the
right frontal lobe white matter. Prominent bilateral basal ganglia
calcifications are seen. There is mild periventricular white matter
hypodensity, likely chronic small vessel ischemic change.

Vascular: No hyperdense vessel or unexpected calcification.

Skull: Normal. Negative for fracture or focal lesion.

Sinuses/Orbits: No acute finding.

Other: None.
IMPRESSION: 1. Findings compatible with acute right PCA distribution infarct. No
hemorrhage or midline shift.
2. Mild diffuse atrophy and mild chronic small vessel ischemic
change.
3. These results were called by telephone at the time of
interpretation on [DATE] at [DATE] to provider Dr. SAILESH, who
verbally acknowledged these results.

## 2021-01-29 NOTE — ED Provider Notes (Signed)
[facility] EMERGENCY DEPARTMENT Provider Note   CSN: [id number] Arrival date & time: 01/29/21  2017     History Chief Complaint  Patient presents with   Tracy [name] is a 87 y.o. female.  The history is provided by the patient, a relative and medical records.  Fall Tracy [name] is a 87 y.o. female who presents to the Emergency Department complaining of fall.  She presents to the ED accompanied by her niece for evaluation following a fall.    Fell saturday.  It occurred she was going to the restroom.  It was unwittnessed.  She is unsure why she fell.  No LOC.  She was able to get herself back up.  Went to church on Sunday without difficulty.    This evening at 6pm, was trying to get up out of the chair and was unable to get up. She lives at home alone but family checks on her frequently. She was last seen at her baseline on Sunday.  No fever, chest pain, abdominal pain, HA.  No numbness.  Has difficulty ambulating.       Past Medical History:  Diagnosis Date   Anemia due to stage 4 chronic kidney disease ([place]) 01/30/2021   Chronic kidney disease, stage 4 (severe) ([place]) 01/30/2021   Essential hypertension 01/30/2021   Mixed hyperlipidemia 01/30/2021   HTN, HPL, CKD Patient Active Problem List   Diagnosis Date Noted   Acute stroke due to occlusion of left posterior cerebral artery ([place]) 01/30/2021   Essential hypertension 01/30/2021   Anemia due to stage 4 chronic kidney disease ([place]) 01/30/2021   Chronic kidney disease, stage 4 (severe) ([place]) 01/30/2021   Mixed hyperlipidemia 01/30/2021       OB History   No obstetric history on file.     Family History  Problem Relation Age of Onset   Heart disease Neg Hx     Social History   Tobacco Use   Smoking status: Never   Smokeless tobacco: Never  Substance Use Topics   Drug use: Never    Home Medications Prior to Admission medications   Not on File    Allergies     Patient has no known allergies.  Review of Systems   Review of Systems  All other systems reviewed and are negative.  Physical Exam Updated Vital Signs BP (!) 152/66 (BP Location: Right Arm)   Pulse 90   Temp 98.2 F (36.8 C) (Oral)   Resp (!) 21   Ht 4\' 9"  (1.448 m)   Wt 44 kg   SpO2 100%   BMI 20.99 kg/m   Physical Exam Vitals and nursing note reviewed.  Constitutional:      Appearance: She is well-developed.  HENT:     Head: Normocephalic and atraumatic.  Cardiovascular:     Rate and Rhythm: Normal rate and regular rhythm.     Heart sounds: No murmur heard. Pulmonary:     Effort: Pulmonary effort is normal. No respiratory distress.     Breath sounds: Normal breath sounds.  Abdominal:     Palpations: Abdomen is soft.     Tenderness: There is no abdominal tenderness. There is no guarding or rebound.  Musculoskeletal:     Comments: Mild soft tissue swelling and tenderness over the right dorsal hand over the third metacarpal. Flexion extension intact at the digits.  Skin:    General: Skin is warm and dry.  Neurological:  Mental Status: She is alert and oriented to person, place, and time.     Comments: No asymmetry of facial movements. There is a left-sided visual field cut. Five out of five strength in all four extremities.  Psychiatric:        Behavior: Behavior normal.    ED Results / Procedures / Treatments   Labs (all labs ordered are listed, but only abnormal results are displayed) Labs Reviewed  CBC WITH DIFFERENTIAL/PLATELET - Abnormal; Notable for the following components:      Result Value   WBC 11.2 (*)    RBC 3.57 (*)    Hemoglobin 10.5 (*)    HCT 32.0 (*)    Neutro Abs 8.7 (*)    All other components within normal limits  COMPREHENSIVE METABOLIC PANEL - Abnormal; Notable for the following components:   Glucose, Bld 118 (*)    BUN 37 (*)    Creatinine, Ser 2.38 (*)    Calcium 8.8 (*)    GFR, Estimated 18 (*)    All other components  within normal limits  HEMOGLOBIN A1C - Abnormal; Notable for the following components:   Hgb A1c MFr Bld 6.0 (*)    All other components within normal limits  COMPREHENSIVE METABOLIC PANEL - Abnormal; Notable for the following components:   Glucose, Bld 104 (*)    BUN 33 (*)    Creatinine, Ser 2.19 (*)    Albumin 3.4 (*)    GFR, Estimated 20 (*)    All other components within normal limits  CBC WITH DIFFERENTIAL/PLATELET - Abnormal; Notable for the following components:   WBC 12.1 (*)    RBC 3.70 (*)    Hemoglobin 10.5 (*)    HCT 33.2 (*)    Monocytes Absolute 1.2 (*)    All other components within normal limits  RESP PANEL BY RT-PCR (FLU A&B, COVID) ARPGX2  URINALYSIS, ROUTINE W REFLEX MICROSCOPIC  LIPID PANEL  MAGNESIUM    EKG EKG Interpretation  Date/Time:  Tuesday January 29 2021 21:47:49 EDT Ventricular Rate:  96 PR Interval:  152 QRS Duration: 78 QT Interval:  346 QTC Calculation: 437 R Axis:   84 Text Interpretation: Normal sinus rhythm Cannot rule out Anterior infarct , age undetermined Abnormal ECG Confirmed by Quintella Reichert [id number]) on 01/30/2021 12:04:15 AM  Radiology CT Head Wo Contrast  Result Date: 01/29/2021 CLINICAL DATA:  Head trauma. EXAM: CT HEAD WITHOUT CONTRAST TECHNIQUE: Contiguous axial images were obtained from the base of the skull through the vertex without intravenous contrast. COMPARISON:  CT head report only from 03/10/2001. FINDINGS: Brain: There is hypodensity with loss of gray-white matter distinction in the right occipital region. Gray-white matter distinction is otherwise preserved. No evidence of hemorrhage, hydrocephalus, extra-axial collection or mass lesion/mass effect. There is mild diffuse atrophy. There is a small old infarct in the right frontal lobe white matter. Prominent bilateral basal ganglia calcifications are seen. There is mild periventricular white matter hypodensity, likely chronic small vessel ischemic change. Vascular: No  hyperdense vessel or unexpected calcification. Skull: Normal. Negative for fracture or focal lesion. Sinuses/Orbits: No acute finding. Other: None. IMPRESSION: 1. 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 No hemorrhage or midline shift. 2. Mild diffuse atrophy and mild chronic small vessel ischemic change. 3. These results were called by telephone at the time of interpretation on 01/29/2021 at 11:05 pm to provider Dr. Pearline Cables, who verbally acknowledged these results. Electronically Signed   By: [name] M.D.   On: 01/29/2021  23:06   MR ANGIO HEAD WO CONTRAST  Result Date: 01/30/2021 CLINICAL DATA:  Acute infarct by head CT. EXAM: MRI HEAD WITHOUT CONTRAST MRA HEAD WITHOUT CONTRAST MRA NECK WITHOUT CONTRAST TECHNIQUE: Multiplanar, multiecho pulse sequences of the brain and surrounding structures were obtained without intravenous contrast. Angiographic images of the Circle of Willis were obtained using MRA technique without intravenous contrast. Angiographic images of the neck were obtained using MRA technique without intravenous contrast. Carotid stenosis measurements (when applicable) are obtained utilizing NASCET criteria, using the distal internal carotid diameter as the denominator. COMPARISON:  None. FINDINGS: MRI HEAD FINDINGS Brain: Acute or early subacute cortical and subcortical infarction in the right occipital lobe. Subacute appearing left periatrial white matter infarct that is small. Moderate size remote superior right frontal infarct. Cerebral volume loss in keeping with age. Mild petechial hemorrhage at the right occipital infarction. No hydrocephalus, hematoma, or mass. Vascular: See below Skull and upper cervical spine: Normal marrow signal Sinuses/Orbits: Bilateral cataract resection MRA HEAD FINDINGS Atheromatous irregularity of anterior and posterior circulation vessels that is mild for age. No branch occlusion, beading, or flow limiting stenosis. Symmetric flow seen in  the posterior cerebral arteries. Mildly lobulated aneurysm extending inferiorly from the supraclinoid left ICA measuring 3.6 mm in length. MRA NECK FINDINGS Antegrade flow in the carotid and vertebral arteries. No flow limiting stenosis seen in the carotid or vertebral circulation. IMPRESSION: Brain MRI: 1. Acute right occipital infarct with mild petechial hemorrhage. 2. Small subacute left periatrial white matter infarct. 3. Remote right frontal infarct. Intracranial MRA: 1. No emergent finding or flow limiting stenosis of major vessels. 2. 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 Neck MRA: Negative. Electronically Signed   By: Jorje Guild M.D.   On: 01/30/2021 07:56   MR ANGIO NECK WO CONTRAST  Result Date: 01/30/2021 CLINICAL DATA:  Acute infarct by head CT. EXAM: MRI HEAD WITHOUT CONTRAST MRA HEAD WITHOUT CONTRAST MRA NECK WITHOUT CONTRAST TECHNIQUE: Multiplanar, multiecho pulse sequences of the brain and surrounding structures were obtained without intravenous contrast. Angiographic images of the Circle of Willis were obtained using MRA technique without intravenous contrast. Angiographic images of the neck were obtained using MRA technique without intravenous contrast. Carotid stenosis measurements (when applicable) are obtained utilizing NASCET criteria, using the distal internal carotid diameter as the denominator. COMPARISON:  None. FINDINGS: MRI HEAD FINDINGS Brain: Acute or early subacute cortical and subcortical infarction in the right occipital lobe. Subacute appearing left periatrial white matter infarct that is small. Moderate size remote superior right frontal infarct. Cerebral volume loss in keeping with age. Mild petechial hemorrhage at the right occipital infarction. No hydrocephalus, hematoma, or mass. Vascular: See below Skull and upper cervical spine: Normal marrow signal Sinuses/Orbits: Bilateral cataract resection MRA HEAD FINDINGS Atheromatous irregularity of anterior and posterior  circulation vessels that is mild for age. No branch occlusion, beading, or flow limiting stenosis. Symmetric flow seen in the posterior cerebral arteries. Mildly lobulated aneurysm extending inferiorly from the supraclinoid left ICA measuring 3.6 mm in length. MRA NECK FINDINGS Antegrade flow in the carotid and vertebral arteries. No flow limiting stenosis seen in the carotid or vertebral circulation. IMPRESSION: Brain MRI: 1. Acute right occipital infarct with mild petechial hemorrhage. 2. Small subacute left periatrial white matter infarct. 3. Remote right frontal infarct. Intracranial MRA: 1. No emergent finding or flow limiting stenosis of major vessels. 2. 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 Neck MRA: Negative. Electronically Signed   By: Jorje Guild M.D.   On: 01/30/2021  07:56   MR BRAIN WO CONTRAST  Result Date: 01/30/2021 CLINICAL DATA:  Acute infarct by head CT. EXAM: MRI HEAD WITHOUT CONTRAST MRA HEAD WITHOUT CONTRAST MRA NECK WITHOUT CONTRAST TECHNIQUE: Multiplanar, multiecho pulse sequences of the brain and surrounding structures were obtained without intravenous contrast. Angiographic images of the Circle of Willis were obtained using MRA technique without intravenous contrast. Angiographic images of the neck were obtained using MRA technique without intravenous contrast. Carotid stenosis measurements (when applicable) are obtained utilizing NASCET criteria, using the distal internal carotid diameter as the denominator. COMPARISON:  None. FINDINGS: MRI HEAD FINDINGS Brain: Acute or early subacute cortical and subcortical infarction in the right occipital lobe. Subacute appearing left periatrial white matter infarct that is small. Moderate size remote superior right frontal infarct. Cerebral volume loss in keeping with age. Mild petechial hemorrhage at the right occipital infarction. No hydrocephalus, hematoma, or mass. Vascular: See below Skull and upper cervical spine: Normal marrow signal  Sinuses/Orbits: Bilateral cataract resection MRA HEAD FINDINGS Atheromatous irregularity of anterior and posterior circulation vessels that is mild for age. No branch occlusion, beading, or flow limiting stenosis. Symmetric flow seen in the posterior cerebral arteries. Mildly lobulated aneurysm extending inferiorly from the supraclinoid left ICA measuring 3.6 mm in length. MRA NECK FINDINGS Antegrade flow in the carotid and vertebral arteries. No flow limiting stenosis seen in the carotid or vertebral circulation. IMPRESSION: Brain MRI: 1. Acute right occipital infarct with mild petechial hemorrhage. 2. Small subacute left periatrial white matter infarct. 3. Remote right frontal infarct. Intracranial MRA: 1. No emergent finding or flow limiting stenosis of major vessels. 2. 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 Neck MRA: Negative. Electronically Signed   By: Jorje Guild M.D.   On: 01/30/2021 07:56   DG Hand Complete Right  Result Date: 01/30/2021 CLINICAL DATA:  Status post fall with pain to the third metacarpophalangeal joint. EXAM: RIGHT HAND - COMPLETE 3+ VIEW COMPARISON:  None. FINDINGS: There is no evidence of an acute fracture or dislocation. Degenerative changes seen along the carpometacarpal articulation of the right thumb. Soft tissues are unremarkable. IMPRESSION: No acute fracture or dislocation. Electronically Signed   By: Virgina Norfolk M.D.   On: 01/30/2021 01:28    Procedures Procedures   Medications Ordered in ED Medications   stroke: mapping our early stages of recovery book (has no administration in time range)  heparin injection 5,000 Units (has no administration in time range)  polyethylene glycol (MIRALAX / GLYCOLAX) packet 17 g (has no administration in time range)  acetaminophen (TYLENOL) tablet 650 mg (has no administration in time range)    Or  acetaminophen (TYLENOL) suppository 650 mg (has no administration in time range)  ondansetron (ZOFRAN) tablet 4 mg (has no  administration in time range)    Or  ondansetron (ZOFRAN) injection 4 mg (has no administration in time range)  clopidogrel (PLAVIX) tablet 300 mg (300 mg Oral Given 01/30/21 0214)    And  clopidogrel (PLAVIX) tablet 75 mg (has no administration in time range)  aspirin EC tablet 81 mg (has no administration in time range)  hydrALAZINE (APRESOLINE) injection 10 mg (has no administration in time range)  atorvastatin (LIPITOR) tablet 10 mg (has no administration in time range)    ED Course  I have reviewed the triage vital signs and the nursing notes.  Pertinent labs & imaging results that were available during my care of the patient were reviewed by me and considered in my medical decision making (see chart for  details).    MDM Rules/Calculators/A&P                          patient here for evaluation of weakness and inability to get out of a seated position. She did have a fall on Saturday. On examination she does have visual field deficit in the left fields. CT scan is significant for acute right PCA stroke. Discussed with patient and family member findings of studies and recommendation for admission and they are in agreement with plan. Neurology consulted regarding acute CVA. Medicine consulted for admission for ongoing treatment.  Final Clinical Impression(s) / ED Diagnoses Final diagnoses:  Acute CVA (cerebrovascular accident) [facility])    Rx / [place] Orders ED Discharge Orders     None        Quintella Reichert, MD 01/30/21 0800

## 2021-01-29 NOTE — ED Provider Notes (Signed)
Emergency Medicine Provider Triage Evaluation Note  Tracy [name] , a 87 y.o. female  was evaluated in triage.  Pt complains of fall at home. Fall unwitnessed with unclear mechanism. Found by niece around 18 tonight. Patient with no c/o pain. Denies headache, CP, SOB, back pain. Did have an episode where she had difficulty getting out of her chair around 1630. Usually ambulates without assist device at baseline. Lives in a home with scheduled in-home care; however this is not continuous. Not chronically anticoagulated.  Review of Systems  Positive: Generalized weakness Negative: CP, SOB, back pain, headache, N/V  Physical Exam  BP 137/70 (BP Location: Right Arm)   Pulse 92   Temp 98.4 F (36.9 C) (Oral)   Resp 18   Ht 4\' 9"  (1.448 m)   Wt 44 kg   SpO2 100%   BMI 20.99 kg/m  Gen:   Awake, no distress   Resp:  Normal effort  MSK:   Moves extremities without difficulty  Other:  No focal deficits. GCS 15.  Medical Decision Making  Medically screening exam initiated at 10:12 PM.  Appropriate orders placed.  Tracy [name] was informed that the remainder of the evaluation will be completed by another provider, this initial triage assessment does not replace that evaluation, and the importance of remaining in the ED until their evaluation is complete.  Generalized weakness, unwitnessed fall   Antonietta Breach, Hershal Coria 01/29/21 2214    Charlesetta Shanks, MD 01/30/21 1322

## 2021-01-29 NOTE — ED Triage Notes (Signed)
Pt from home with Niece. States pt was getting up to go to the bathroom. Pt had difficulty getting out of her chair. Pt has recently had difficulty standing and walking. Pt had an unwitnessed fall and found on the floor. Family reports another fall Saturday, also unwitnessed. Pt denies any pain.

## 2021-01-30 ENCOUNTER — Inpatient Hospital Stay (HOSPITAL_COMMUNITY): Payer: Medicare Other

## 2021-01-30 ENCOUNTER — Encounter (HOSPITAL_COMMUNITY): Payer: Self-pay | Admitting: Internal Medicine

## 2021-01-30 ENCOUNTER — Emergency Department (HOSPITAL_COMMUNITY): Payer: Medicare Other

## 2021-01-30 DIAGNOSIS — E782 Mixed hyperlipidemia: Secondary | ICD-10-CM | POA: Diagnosis present

## 2021-01-30 DIAGNOSIS — I10 Essential (primary) hypertension: Secondary | ICD-10-CM | POA: Diagnosis present

## 2021-01-30 DIAGNOSIS — I63532 Cerebral infarction due to unspecified occlusion or stenosis of left posterior cerebral artery: Secondary | ICD-10-CM

## 2021-01-30 DIAGNOSIS — N184 Chronic kidney disease, stage 4 (severe): Secondary | ICD-10-CM | POA: Diagnosis present

## 2021-01-30 DIAGNOSIS — I129 Hypertensive chronic kidney disease with stage 1 through stage 4 chronic kidney disease, or unspecified chronic kidney disease: Secondary | ICD-10-CM | POA: Diagnosis present

## 2021-01-30 DIAGNOSIS — S0101XA Laceration without foreign body of scalp, initial encounter: Secondary | ICD-10-CM

## 2021-01-30 DIAGNOSIS — Z9181 History of falling: Secondary | ICD-10-CM | POA: Diagnosis not present

## 2021-01-30 DIAGNOSIS — H53462 Homonymous bilateral field defects, left side: Secondary | ICD-10-CM | POA: Diagnosis present

## 2021-01-30 DIAGNOSIS — Z7902 Long term (current) use of antithrombotics/antiplatelets: Secondary | ICD-10-CM | POA: Diagnosis not present

## 2021-01-30 DIAGNOSIS — Z87898 Personal history of other specified conditions: Secondary | ICD-10-CM

## 2021-01-30 DIAGNOSIS — D631 Anemia in chronic kidney disease: Secondary | ICD-10-CM

## 2021-01-30 DIAGNOSIS — Z79899 Other long term (current) drug therapy: Secondary | ICD-10-CM | POA: Diagnosis not present

## 2021-01-30 DIAGNOSIS — R296 Repeated falls: Secondary | ICD-10-CM | POA: Diagnosis present

## 2021-01-30 DIAGNOSIS — F05 Delirium due to known physiological condition: Secondary | ICD-10-CM | POA: Diagnosis not present

## 2021-01-30 DIAGNOSIS — R29810 Facial weakness: Secondary | ICD-10-CM | POA: Diagnosis present

## 2021-01-30 DIAGNOSIS — I619 Nontraumatic intracerebral hemorrhage, unspecified: Secondary | ICD-10-CM | POA: Diagnosis present

## 2021-01-30 DIAGNOSIS — Z8673 Personal history of transient ischemic attack (TIA), and cerebral infarction without residual deficits: Secondary | ICD-10-CM | POA: Diagnosis not present

## 2021-01-30 DIAGNOSIS — Z66 Do not resuscitate: Secondary | ICD-10-CM | POA: Diagnosis present

## 2021-01-30 DIAGNOSIS — S72001A Fracture of unspecified part of neck of right femur, initial encounter for closed fracture: Principal | ICD-10-CM

## 2021-01-30 DIAGNOSIS — I671 Cerebral aneurysm, nonruptured: Secondary | ICD-10-CM | POA: Diagnosis present

## 2021-01-30 DIAGNOSIS — Z20822 Contact with and (suspected) exposure to covid-19: Secondary | ICD-10-CM | POA: Diagnosis present

## 2021-01-30 DIAGNOSIS — E785 Hyperlipidemia, unspecified: Secondary | ICD-10-CM | POA: Diagnosis present

## 2021-01-30 DIAGNOSIS — I63433 Cerebral infarction due to embolism of bilateral posterior cerebral arteries: Secondary | ICD-10-CM | POA: Diagnosis present

## 2021-01-30 DIAGNOSIS — I639 Cerebral infarction, unspecified: Secondary | ICD-10-CM | POA: Diagnosis present

## 2021-01-30 DIAGNOSIS — H539 Unspecified visual disturbance: Secondary | ICD-10-CM | POA: Diagnosis present

## 2021-01-30 DIAGNOSIS — Z86718 Personal history of other venous thrombosis and embolism: Secondary | ICD-10-CM

## 2021-01-30 DIAGNOSIS — R29700 NIHSS score 0: Secondary | ICD-10-CM | POA: Diagnosis present

## 2021-01-30 DIAGNOSIS — N189 Chronic kidney disease, unspecified: Secondary | ICD-10-CM | POA: Diagnosis present

## 2021-01-30 DIAGNOSIS — Z9889 Other specified postprocedural states: Secondary | ICD-10-CM

## 2021-01-30 HISTORY — DX: Chronic kidney disease, stage 4 (severe): N18.4

## 2021-01-30 HISTORY — DX: Anemia in chronic kidney disease: D63.1

## 2021-01-30 HISTORY — DX: Cerebral infarction due to unspecified occlusion or stenosis of left posterior cerebral artery: I63.532

## 2021-01-30 HISTORY — DX: Mixed hyperlipidemia: E78.2

## 2021-01-30 HISTORY — DX: Essential (primary) hypertension: I10

## 2021-01-30 LAB — RESP PANEL BY RT-PCR (FLU A&B, COVID) ARPGX2
Influenza A by PCR: NEGATIVE
Influenza B by PCR: NEGATIVE
SARS Coronavirus 2 by RT PCR: NEGATIVE

## 2021-01-30 LAB — CBC WITH DIFFERENTIAL/PLATELET
Abs Immature Granulocytes: 0.04 10*3/uL (ref 0.00–0.07)
Basophils Absolute: 0 10*3/uL (ref 0.0–0.1)
Basophils Relative: 0 %
Eosinophils Absolute: 0.1 10*3/uL (ref 0.0–0.5)
Eosinophils Relative: 1 %
HCT: 33.2 % — ABNORMAL LOW (ref 36.0–46.0)
Hemoglobin: 10.5 g/dL — ABNORMAL LOW (ref 12.0–15.0)
Immature Granulocytes: 0 %
Lymphocytes Relative: 30 %
Lymphs Abs: 3.7 10*3/uL (ref 0.7–4.0)
MCH: 28.4 pg (ref 26.0–34.0)
MCHC: 31.6 g/dL (ref 30.0–36.0)
MCV: 89.7 fL (ref 80.0–100.0)
Monocytes Absolute: 1.2 10*3/uL — ABNORMAL HIGH (ref 0.1–1.0)
Monocytes Relative: 10 %
Neutro Abs: 7.1 10*3/uL (ref 1.7–7.7)
Neutrophils Relative %: 59 %
Platelets: 260 10*3/uL (ref 150–400)
RBC: 3.7 MIL/uL — ABNORMAL LOW (ref 3.87–5.11)
RDW: 13.2 % (ref 11.5–15.5)
WBC: 12.1 10*3/uL — ABNORMAL HIGH (ref 4.0–10.5)
nRBC: 0 % (ref 0.0–0.2)

## 2021-01-30 LAB — URINALYSIS, ROUTINE W REFLEX MICROSCOPIC
Bilirubin Urine: NEGATIVE
Glucose, UA: NEGATIVE mg/dL
Hgb urine dipstick: NEGATIVE
Ketones, ur: NEGATIVE mg/dL
Leukocytes,Ua: NEGATIVE
Nitrite: NEGATIVE
Protein, ur: NEGATIVE mg/dL
Specific Gravity, Urine: 1.006 (ref 1.005–1.030)
pH: 5 (ref 5.0–8.0)

## 2021-01-30 LAB — COMPREHENSIVE METABOLIC PANEL
ALT: 12 U/L (ref 0–44)
AST: 28 U/L (ref 15–41)
Albumin: 3.4 g/dL — ABNORMAL LOW (ref 3.5–5.0)
Alkaline Phosphatase: 72 U/L (ref 38–126)
Anion gap: 10 (ref 5–15)
BUN: 33 mg/dL — ABNORMAL HIGH (ref 8–23)
CO2: 24 mmol/L (ref 22–32)
Calcium: 8.9 mg/dL (ref 8.9–10.3)
Chloride: 103 mmol/L (ref 98–111)
Creatinine, Ser: 2.19 mg/dL — ABNORMAL HIGH (ref 0.44–1.00)
GFR, Estimated: 20 mL/min — ABNORMAL LOW (ref >60)
Glucose, Bld: 104 mg/dL — ABNORMAL HIGH (ref 70–99)
Potassium: 3.5 mmol/L (ref 3.5–5.1)
Sodium: 137 mmol/L (ref 135–145)
Total Bilirubin: 0.6 mg/dL (ref 0.3–1.2)
Total Protein: 6.5 g/dL (ref 6.5–8.1)

## 2021-01-30 LAB — HEMOGLOBIN A1C
Hgb A1c MFr Bld: 6 % — ABNORMAL HIGH (ref 4.8–5.6)
Mean Plasma Glucose: 125.5 mg/dL

## 2021-01-30 LAB — LIPID PANEL
Cholesterol: 171 mg/dL (ref 0–200)
HDL: 78 mg/dL (ref >40)
LDL Cholesterol: 79 mg/dL (ref 0–99)
Total CHOL/HDL Ratio: 2.2 RATIO
Triglycerides: 71 mg/dL (ref <150)
VLDL: 14 mg/dL (ref 0–40)

## 2021-01-30 LAB — ECHOCARDIOGRAM COMPLETE BUBBLE STUDY
AV Mean grad: 3 mmHg
AV Peak grad: 5.3 mmHg
Ao pk vel: 1.15 m/s
Area-P 1/2: 4.8 cm2
S' Lateral: 2.2 cm

## 2021-01-30 LAB — MAGNESIUM: Magnesium: 2.1 mg/dL (ref 1.7–2.4)

## 2021-01-30 IMAGING — MR MR HEAD W/O CM
11 of 22 series · 17 of 48 positions shown · non-contrast
Comparison: None.

CLINICAL DATA: Acute infarct by head CT.



[Series 2: DWI · axial · 3.0mm · 0.86mm/px · z∈[-95,+64]mm · 2 of 108 slices shown (1 of 3)]
[im 1/108]
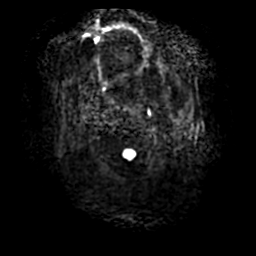
[im 108/108]
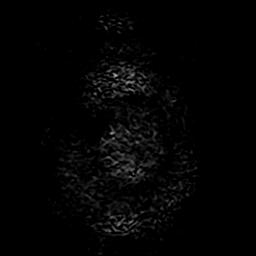

[Series 3: DWI · axial · 3.0mm · 0.86mm/px · z∈[-95,+64]mm · 2 of 103 slices shown (2 of 3)]
[im 1/103]
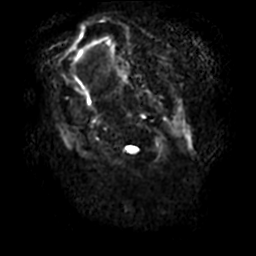
[im 103/103]
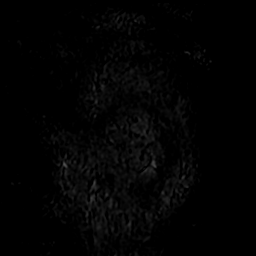

[Series 4: DWI · coronal · 4.0mm · 0.94mm/px · 1 of 78 slices shown (3 of 3)]
[im 1/78]
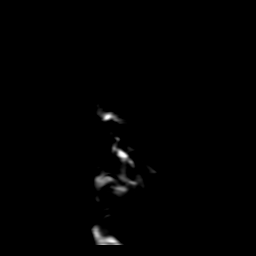

[Series 5: ax (id) · axial · 1.0mm · 0.43mm/px · z∈[-87,-30]mm · 3 of 176 slices shown]
[im 1/176]
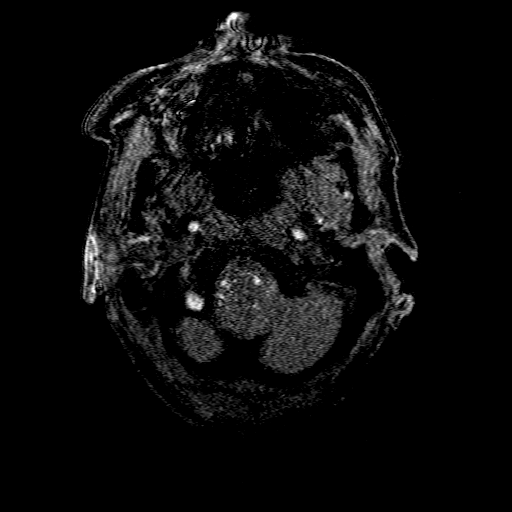
[im 59/176]
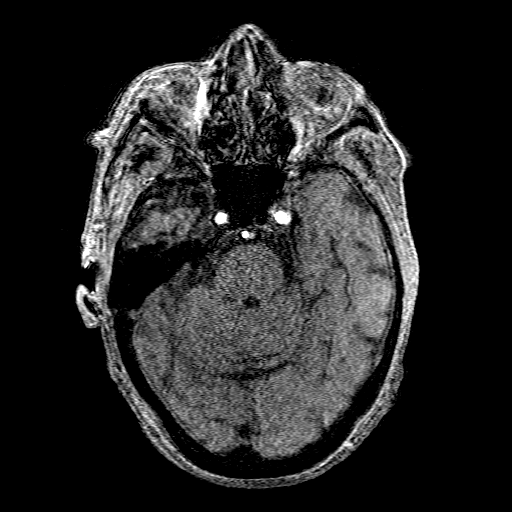
[im 117/176]
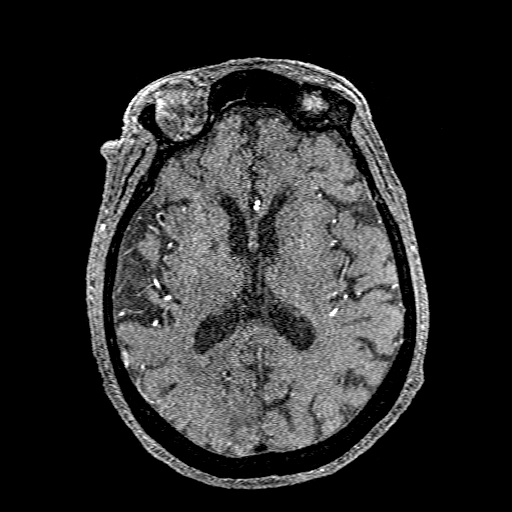

[Series 6: FLAIR · sagittal · 5.0mm · 0.23mm/px · 1 of 26 slices shown (1 of 2)]
[im 1/26]
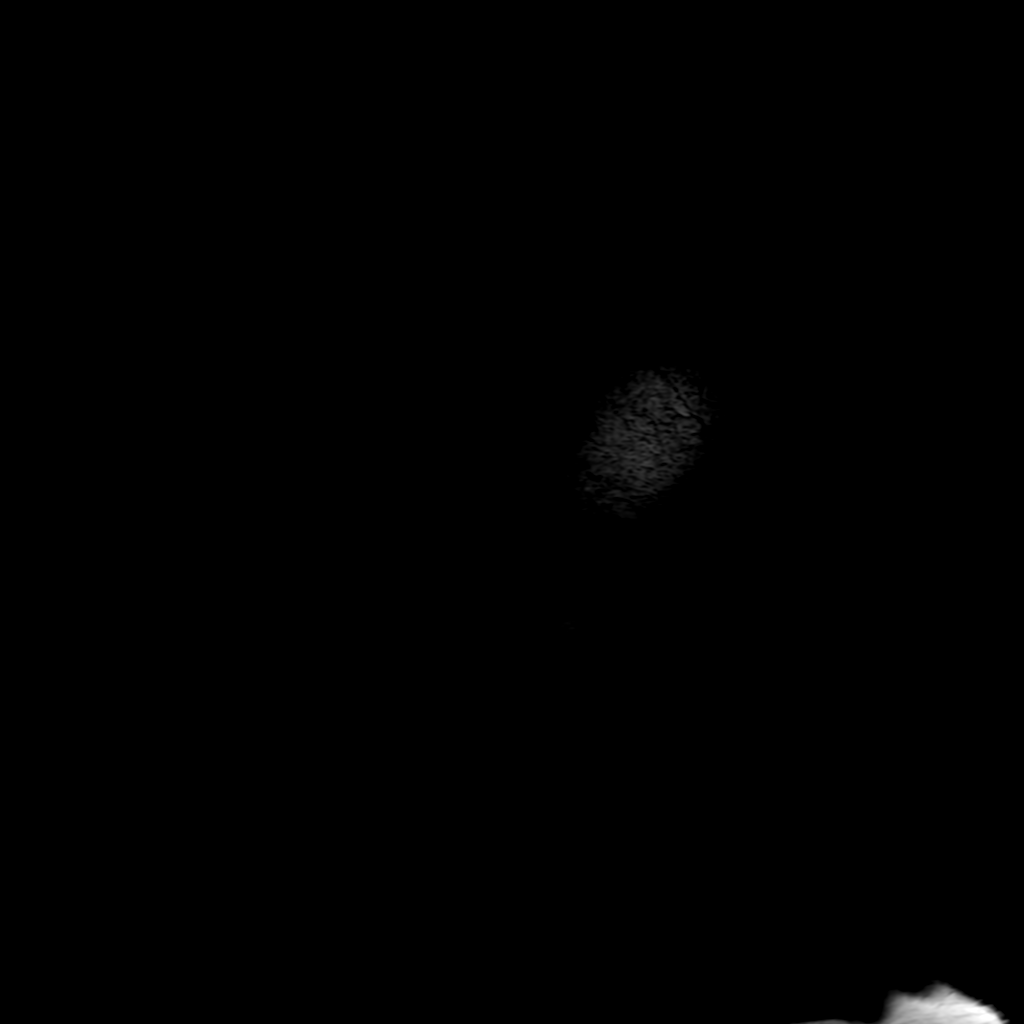

[Series 7: T2 · axial · 5.0mm · 0.23mm/px · 1 of 28 slices shown (1 of 2)]
[im 1/28]
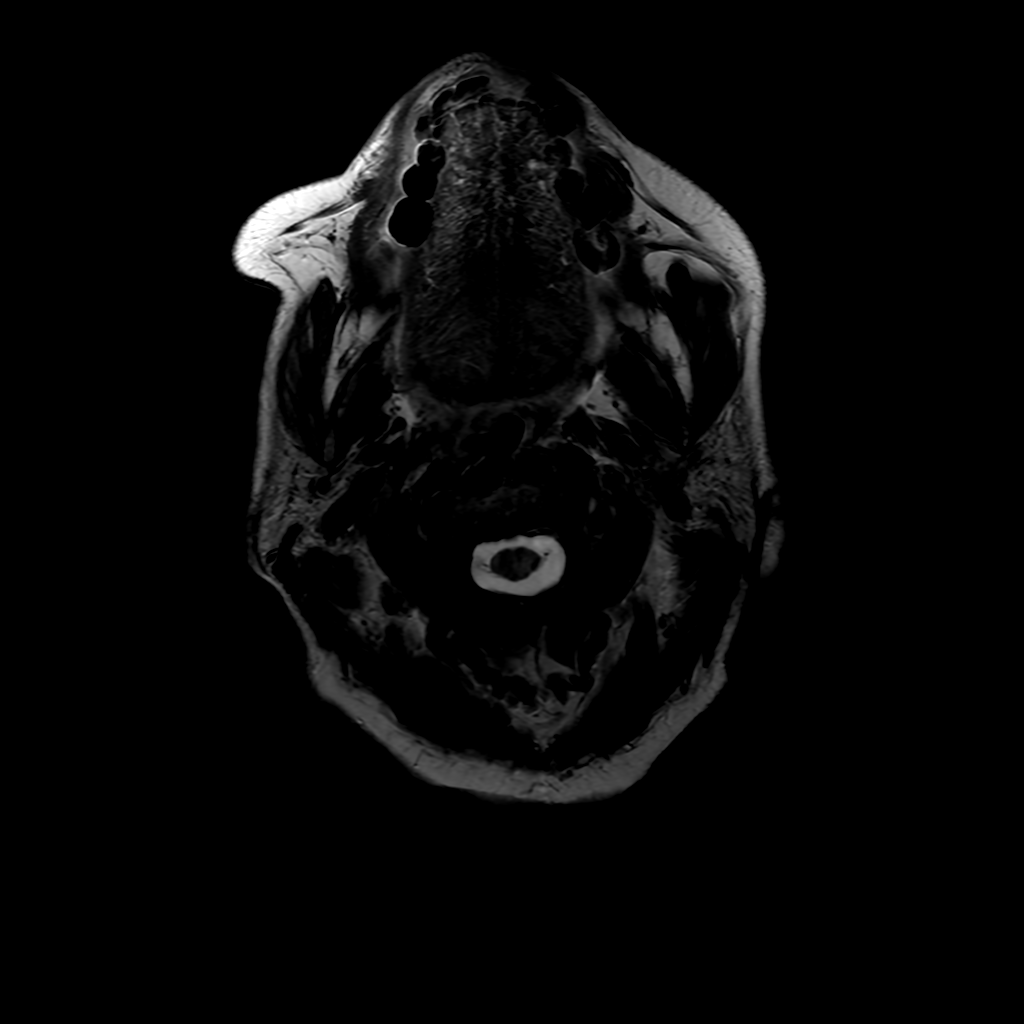

[Series 8: FLAIR · axial · 4.0mm · 0.47mm/px · 1 of 36 slices shown (2 of 2)]
[im 1/36]
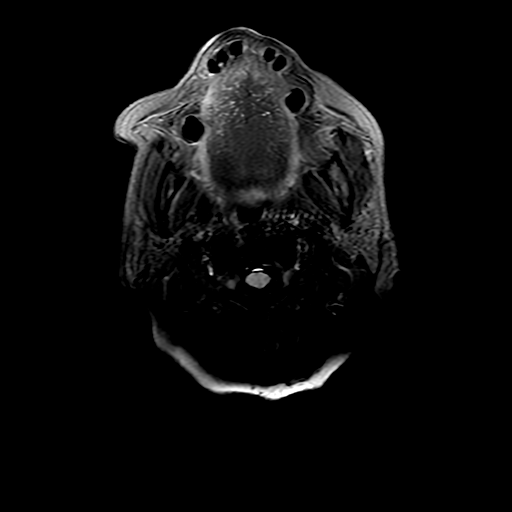

[Series 11: T2 · coronal · 5.0mm · 0.20mm/px · 1 of 32 slices shown (2 of 2)]
[im 1/32]
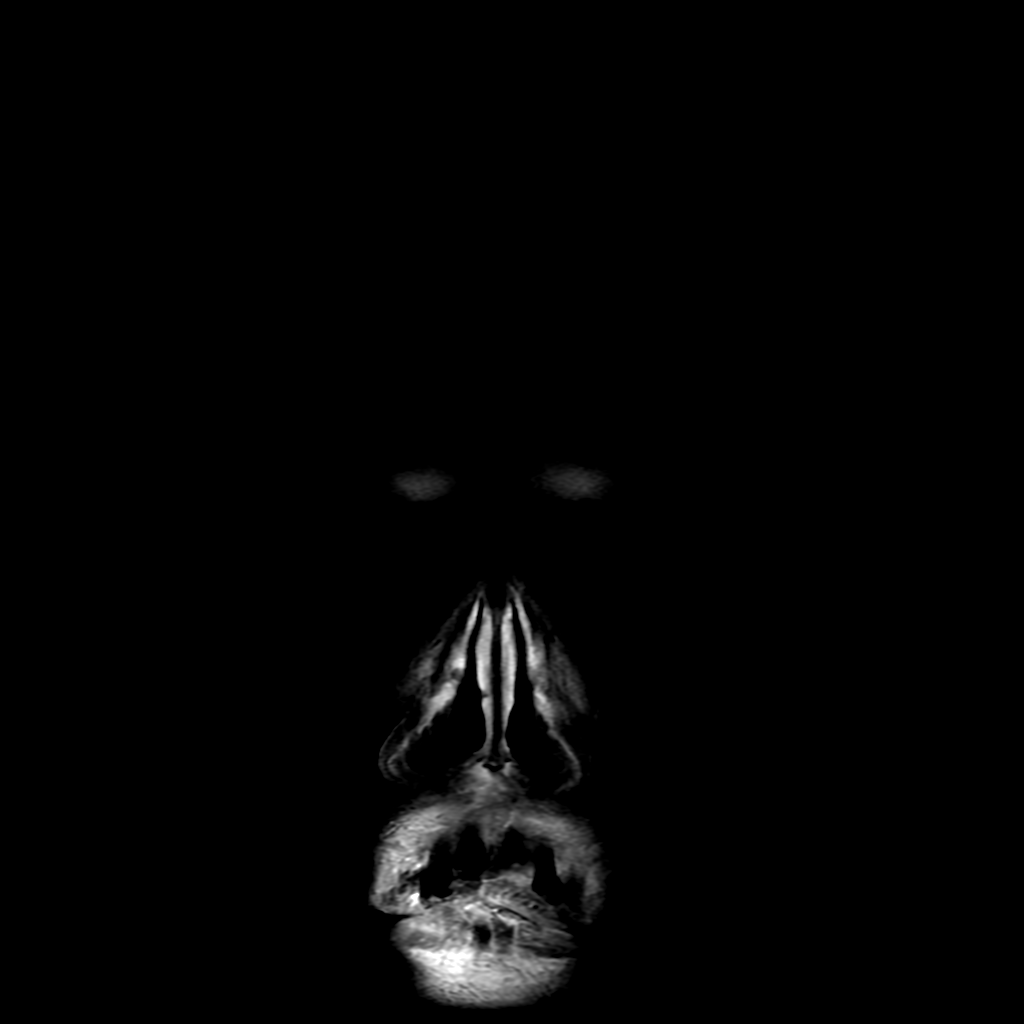

[Series 250: ADC · axial · 3.0mm · 0.86mm/px · z∈[-95,+64]mm · 2 of 54 slices shown (1 of 3)]
[im 1/54]
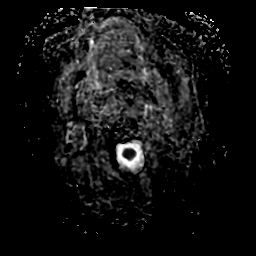
[im 54/54]
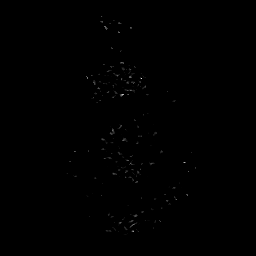

[Series 350: ADC · axial · 3.0mm · 0.86mm/px · z∈[-95,+64]mm · 2 of 54 slices shown (2 of 3)]
[im 1/54]
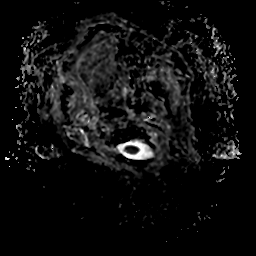
[im 54/54]
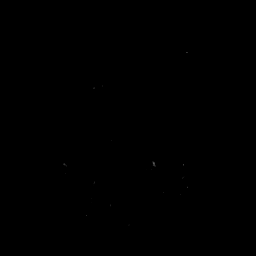

[Series 450: ADC · coronal · 4.0mm · 0.94mm/px · 1 of 39 slices shown (3 of 3)]
[im 1/39]
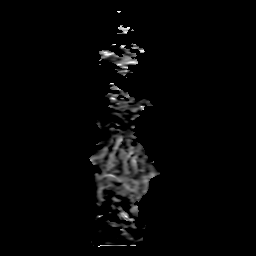

[17 of 48 positions shown; findings below may reference images not displayed]

FINDINGS: MRI HEAD FINDINGS

Brain: Acute or early subacute cortical and subcortical infarction
in the right occipital lobe. Subacute appearing left periatrial
white matter infarct that is small.

Moderate size remote superior right frontal infarct. Cerebral volume
loss in keeping with age. Mild petechial hemorrhage at the right
occipital infarction. No hydrocephalus, hematoma, or mass.

Vascular: See below

Skull and upper cervical spine: Normal marrow signal

Sinuses/Orbits: Bilateral cataract resection

MRA HEAD FINDINGS

Atheromatous irregularity of anterior and posterior circulation
vessels that is mild for age. No branch occlusion, beading, or flow
limiting stenosis. Symmetric flow seen in the posterior cerebral
arteries.

Mildly lobulated aneurysm extending inferiorly from the supraclinoid
left ICA measuring 3.6 mm in length.

MRA NECK FINDINGS

Antegrade flow in the carotid and vertebral arteries. No flow
limiting stenosis seen in the carotid or vertebral circulation.
IMPRESSION: Brain MRI:

1. Acute right occipital infarct with mild petechial hemorrhage.
2. Small subacute left periatrial white matter infarct.
3. Remote right frontal infarct.

Intracranial MRA:

1. No emergent finding or flow limiting stenosis of major vessels.
2. 3.6 mm left supraclinoid ICA aneurysm.

Neck MRA:

Negative.

## 2021-01-30 IMAGING — DX DG HAND COMPLETE 3+V*R*
3 series · 3 of 3 positions shown · non-contrast
Comparison: None.

CLINICAL DATA: Status post fall with pain to the third
metacarpophalangeal joint.

EXAM:
RIGHT HAND - COMPLETE 3+ VIEW

[hand obl]
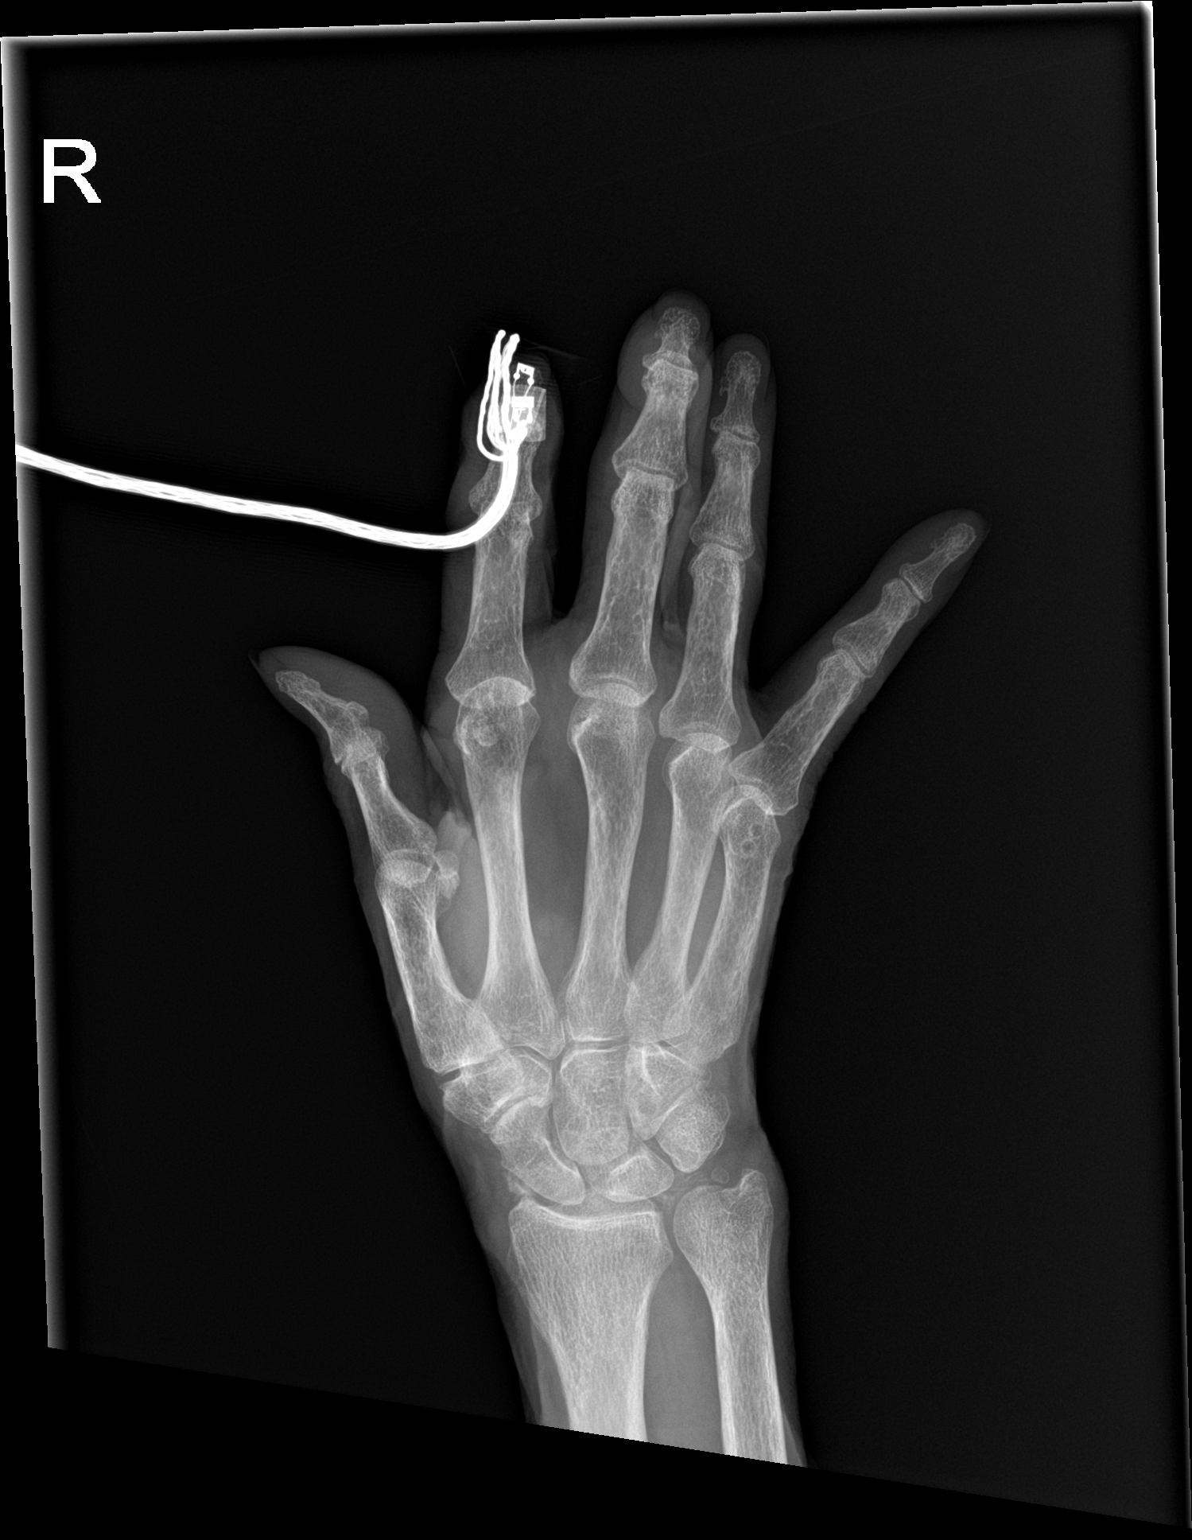

[hand lat]
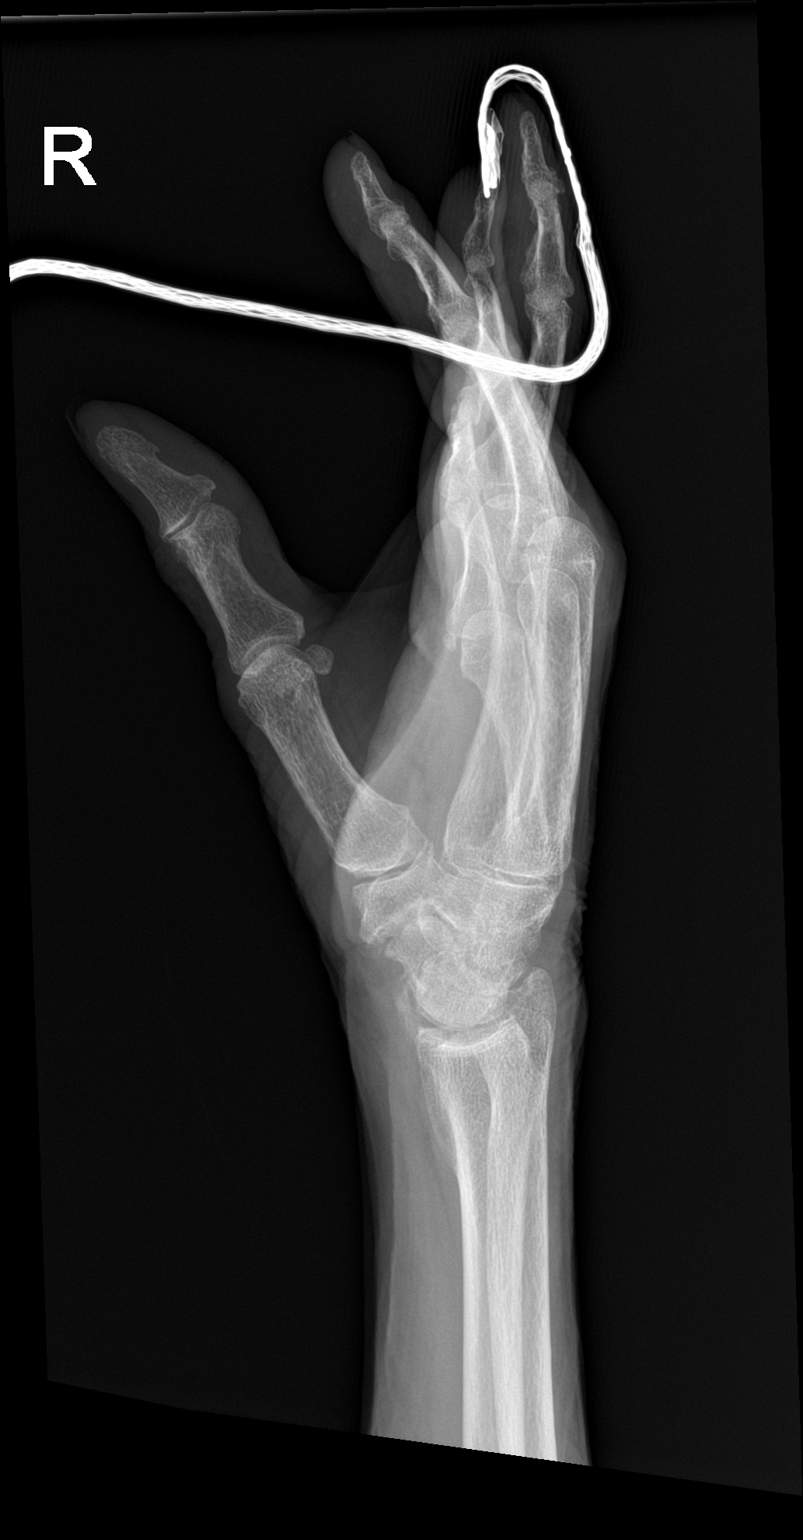

[hand ap]
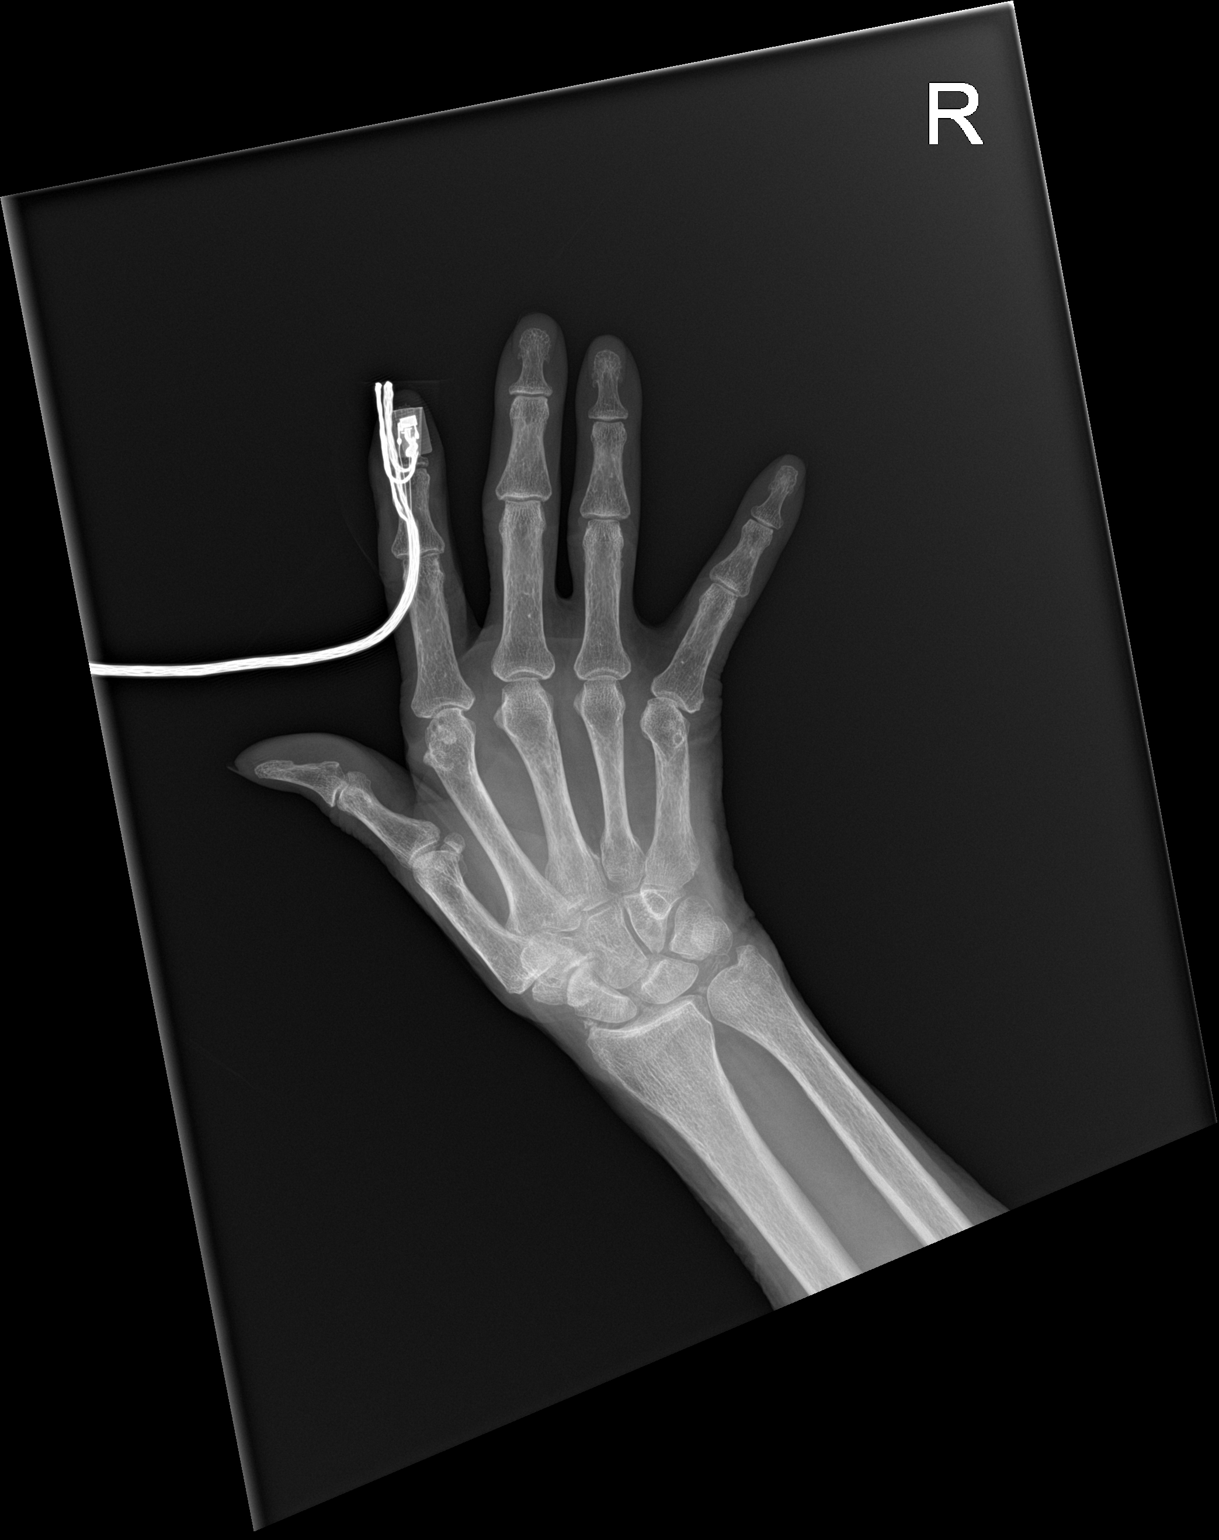

[3 of 3 positions shown; findings below may reference images not displayed]

FINDINGS: There is no evidence of an acute fracture or dislocation.
Degenerative changes seen along the carpometacarpal articulation of
the right thumb. Soft tissues are unremarkable.
IMPRESSION: No acute fracture or dislocation.

## 2021-01-30 MED ORDER — POLYETHYLENE GLYCOL 3350 17 G PO PACK: 17.0000 g | PACK | Freq: Every day | ORAL | Status: DC | PRN

## 2021-01-30 MED ORDER — PANTOPRAZOLE SODIUM 40 MG PO TBEC
40.0000 mg | DELAYED_RELEASE_TABLET | Freq: Every day | ORAL | Status: DC
  Administered 2021-01-30 – 2021-02-01 (×3): 40 mg via ORAL
  Filled 2021-01-30 (×3): qty 1

## 2021-01-30 MED ORDER — ONDANSETRON HCL 4 MG PO TABS: 4.0000 mg | ORAL_TABLET | Freq: Four times a day (QID) | ORAL | Status: DC | PRN

## 2021-01-30 MED ORDER — HEPARIN SODIUM (PORCINE) 5000 UNIT/ML IJ SOLN
5000.0000 [IU] | Freq: Three times a day (TID) | INTRAMUSCULAR | Status: DC
  Administered 2021-01-30 – 2021-02-01 (×6): 5000 [IU] via SUBCUTANEOUS
  Filled 2021-01-30 (×6): qty 1

## 2021-01-30 MED ORDER — ATORVASTATIN CALCIUM 10 MG PO TABS
10.0000 mg | ORAL_TABLET | Freq: Every day | ORAL | Status: DC
  Administered 2021-01-30 – 2021-01-31 (×2): 10 mg via ORAL
  Filled 2021-01-30 (×2): qty 1

## 2021-01-30 MED ORDER — ASPIRIN EC 81 MG PO TBEC
81.0000 mg | DELAYED_RELEASE_TABLET | Freq: Every day | ORAL | Status: DC
  Administered 2021-01-30 – 2021-02-01 (×3): 81 mg via ORAL
  Filled 2021-01-30 (×3): qty 1

## 2021-01-30 MED ORDER — STROKE: EARLY STAGES OF RECOVERY BOOK
Freq: Once | Status: DC
  Filled 2021-01-30: qty 1

## 2021-01-30 MED ORDER — HALOPERIDOL LACTATE 5 MG/ML IJ SOLN
2.0000 mg | Freq: Four times a day (QID) | INTRAMUSCULAR | Status: DC | PRN
  Administered 2021-01-30: 3 mg via INTRAVENOUS
  Filled 2021-01-30: qty 1

## 2021-01-30 MED ORDER — HYDRALAZINE HCL 20 MG/ML IJ SOLN: 10.0000 mg | Freq: Four times a day (QID) | INTRAMUSCULAR | Status: DC | PRN

## 2021-01-30 MED ORDER — CLOPIDOGREL BISULFATE 75 MG PO TABS
75.0000 mg | ORAL_TABLET | Freq: Every day | ORAL | Status: DC
  Administered 2021-01-31 – 2021-02-01 (×2): 75 mg via ORAL
  Filled 2021-01-30 (×2): qty 1

## 2021-01-30 MED ORDER — ACETAMINOPHEN 650 MG RE SUPP: 650.0000 mg | Freq: Four times a day (QID) | RECTAL | Status: DC | PRN

## 2021-01-30 MED ORDER — ACETAMINOPHEN 325 MG PO TABS
650.0000 mg | ORAL_TABLET | Freq: Four times a day (QID) | ORAL | Status: DC | PRN
  Administered 2021-01-31: 650 mg via ORAL
  Filled 2021-01-30: qty 2

## 2021-01-30 MED ORDER — CLOPIDOGREL BISULFATE 300 MG PO TABS
300.0000 mg | ORAL_TABLET | Freq: Once | ORAL | Status: AC
  Administered 2021-01-30: 300 mg via ORAL

## 2021-01-30 MED ORDER — ONDANSETRON HCL 4 MG/2ML IJ SOLN: 4.0000 mg | Freq: Four times a day (QID) | INTRAMUSCULAR | Status: DC | PRN

## 2021-01-30 NOTE — Progress Notes (Signed)
PROGRESS NOTE                                                                                                                                                                                                             Patient Demographics:    Tracy [name], is a 87 y.o. female, DOB - 10/14/1920, ZRA:[id number]  Outpatient Primary MD for the patient is Tracy Panda, MD    LOS - 0  Admit date - 01/29/2021    Chief Complaint  Patient presents with   Fall       Brief Narrative (HPI from H&P)  - 87 year old female with past medical history of hyperlipidemia, hypertension, chronic kidney disease stage IV and anemia of chronic disease who presents to [facility] emergency department with her niece with sudden fall and weakness work-up suggestive of acute stroke in the right PCA distribution she was admitted for further care.   Subjective:    Tracy [name] today has, No headache, No chest pain, No abdominal pain - No Nausea, No new weakness tingling or numbness, no cough or shortness of breath.  Feels much better.   Assessment  & Plan :     Acute left PCA territory stroke.  Neurology on board full stroke pathway being followed, placed on low-dose Lipitor along with dual antiplatelet therapy.  May require placement.  Will defer further work-up and management to the stroke team.  2.  Essential hypertension.  Permissive hypertension for now due to acute stroke.  3.  CKD stage IV.  Creatinine at baseline.  4.  Anemia of chronic disease.  No acute issues.  5. Dyslipidemia.  LDL borderline high due to her age will place her on low-dose statin.         Condition - Extremely Guarded  Family Communication  :  Niece [name] [phone] 01/30/21  Code Status :  DNR  Consults  :  Neuro  PUD Prophylaxis : PPI   Procedures  :     MRI/A -  1. Acute right occipital infarct with mild petechial hemorrhage. 2. Small  subacute left periatrial white matter infarct. 3. Remote right frontal infarct. Intracranial MRA: 1. No emergent finding or flow limiting stenosis of major vessels. 2. 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 Neck MRA: Negative.      Disposition Plan  :  Status is: Inpatient  Remains inpatient appropriate because: CVA   DVT Prophylaxis  :    heparin injection 5,000 Units Start: 01/30/21 1400    Lab Results  Component Value Date   PLT 260 01/30/2021    Diet :  Diet Order             Diet NPO time specified  Diet effective now                    Inpatient Medications  Scheduled Meds:   stroke: mapping our early stages of recovery book   Does not apply Once   aspirin EC  81 mg Oral Daily   atorvastatin  10 mg Oral Daily   [START ON 01/31/2021] clopidogrel  75 mg Oral Daily   heparin  5,000 Units Subcutaneous Q8H   Continuous Infusions: PRN Meds:.acetaminophen **OR** acetaminophen, hydrALAZINE, ondansetron **OR** ondansetron (ZOFRAN) IV, polyethylene glycol  Antibiotics  :    Anti-infectives (From admission, onward)    None        Time Spent in minutes  30   Lala Lund M.D on 01/30/2021 at 10:06 AM  To page go to [email or web address]   [facility] -  Office  [phone]  See all Orders from today for further details    Objective:   Vitals:   01/30/21 0030 01/30/21 0245 01/30/21 0639 01/30/21 0803  BP: (!) 145/77 132/73 (!) 152/66 139/66  Pulse: 96 91 90 88  Resp: (!) 23 16 (!) 21 20  Temp:   98.2 F (36.8 C) 98 F (36.7 C)  TempSrc:   Oral Oral  SpO2: 99% 95% 100% 100%  Weight:      Height:        Wt Readings from Last 3 Encounters:  01/29/21 44 kg    No intake or output data in the 24 hours ending 01/30/21 1006   Physical Exam  Awake Alert, No new F.N deficits, Normal affect [place].AT,PERRAL Supple Neck, No JVD,   Symmetrical Chest wall movement, Good air movement bilaterally, CTAB RRR,No Gallops,Rubs or new Murmurs,  +ve  B.Sounds, Abd Soft, No tenderness,   No Cyanosis, Clubbing or edema,        Data Review:    CBC Recent Labs  Lab 01/29/21 2230 01/30/21 0317  WBC 11.2* 12.1*  HGB 10.5* 10.5*  HCT 32.0* 33.2*  PLT 266 260  MCV 89.6 89.7  MCH 29.4 28.4  MCHC 32.8 31.6  RDW 13.1 13.2  LYMPHSABS 1.7 3.7  MONOABS 0.7 1.2*  EOSABS 0.0 0.1  BASOSABS 0.0 0.0    Recent Labs  Lab 01/29/21 2230 01/30/21 0317  NA 136 137  K 3.8 3.5  CL 102 103  CO2 22 24  GLUCOSE 118* 104*  BUN 37* 33*  CREATININE 2.38* 2.19*  CALCIUM 8.8* 8.9  AST 25 28  ALT 7 12  ALKPHOS 74 72  BILITOT 0.8 0.6  ALBUMIN 3.5 3.4*  MG  --  2.1  HGBA1C  --  6.0*    ------------------------------------------------------------------------------------------------------------------ Recent Labs    01/30/21 0317  CHOL 171  HDL 78  LDLCALC 79  TRIG 71  CHOLHDL 2.2    Lab Results  Component Value Date   HGBA1C 6.0 (H) 01/30/2021   ------------------------------------------------------------------------------------------------------------------ No results for input(s): TSH, T4TOTAL, T3FREE, THYROIDAB in the last 72 hours.  Invalid input(s): FREET3  Cardiac Enzymes No results for input(s): CKMB, TROPONINI, MYOGLOBIN in the last 168 hours.  Invalid input(s): CK ------------------------------------------------------------------------------------------------------------------ No results  found for: BNP   Radiology Reports CT Head Wo Contrast  Result Date: 01/29/2021 CLINICAL DATA:  Head trauma. EXAM: CT HEAD WITHOUT CONTRAST TECHNIQUE: Contiguous axial images were obtained from the base of the skull through the vertex without intravenous contrast. COMPARISON:  CT head report only from 03/10/2001. FINDINGS: Brain: There is hypodensity with loss of gray-white matter distinction in the right occipital region. Gray-white matter distinction is otherwise preserved. No evidence of hemorrhage, hydrocephalus, extra-axial  collection or mass lesion/mass effect. There is mild diffuse atrophy. There is a small old infarct in the right frontal lobe white matter. Prominent bilateral basal ganglia calcifications are seen. There is mild periventricular white matter hypodensity, likely chronic small vessel ischemic change. Vascular: No hyperdense vessel or unexpected calcification. Skull: Normal. Negative for fracture or focal lesion. Sinuses/Orbits: No acute finding. Other: None. IMPRESSION: 1. 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 No hemorrhage or midline shift. 2. Mild diffuse atrophy and mild chronic small vessel ischemic change. 3. These results were called by telephone at the time of interpretation on 01/29/2021 at 11:05 pm to provider Dr. Pearline Cables, who verbally acknowledged these results. Electronically Signed   By: [name] M.D.   On: 01/29/2021 23:06   MR ANGIO HEAD WO CONTRAST  Result Date: 01/30/2021 CLINICAL DATA:  Acute infarct by head CT. EXAM: MRI HEAD WITHOUT CONTRAST MRA HEAD WITHOUT CONTRAST MRA NECK WITHOUT CONTRAST TECHNIQUE: Multiplanar, multiecho pulse sequences of the brain and surrounding structures were obtained without intravenous contrast. Angiographic images of the Circle of Willis were obtained using MRA technique without intravenous contrast. Angiographic images of the neck were obtained using MRA technique without intravenous contrast. Carotid stenosis measurements (when applicable) are obtained utilizing NASCET criteria, using the distal internal carotid diameter as the denominator. COMPARISON:  None. FINDINGS: MRI HEAD FINDINGS Brain: Acute or early subacute cortical and subcortical infarction in the right occipital lobe. Subacute appearing left periatrial white matter infarct that is small. Moderate size remote superior right frontal infarct. Cerebral volume loss in keeping with age. Mild petechial hemorrhage at the right occipital infarction. No hydrocephalus, hematoma, or  mass. Vascular: See below Skull and upper cervical spine: Normal marrow signal Sinuses/Orbits: Bilateral cataract resection MRA HEAD FINDINGS Atheromatous irregularity of anterior and posterior circulation vessels that is mild for age. No branch occlusion, beading, or flow limiting stenosis. Symmetric flow seen in the posterior cerebral arteries. Mildly lobulated aneurysm extending inferiorly from the supraclinoid left ICA measuring 3.6 mm in length. MRA NECK FINDINGS Antegrade flow in the carotid and vertebral arteries. No flow limiting stenosis seen in the carotid or vertebral circulation. IMPRESSION: Brain MRI: 1. Acute right occipital infarct with mild petechial hemorrhage. 2. Small subacute left periatrial white matter infarct. 3. Remote right frontal infarct. Intracranial MRA: 1. No emergent finding or flow limiting stenosis of major vessels. 2. 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 Neck MRA: Negative. Electronically Signed   By: Jorje Guild M.D.   On: 01/30/2021 07:56   MR ANGIO NECK WO CONTRAST  Result Date: 01/30/2021 CLINICAL DATA:  Acute infarct by head CT. EXAM: MRI HEAD WITHOUT CONTRAST MRA HEAD WITHOUT CONTRAST MRA NECK WITHOUT CONTRAST TECHNIQUE: Multiplanar, multiecho pulse sequences of the brain and surrounding structures were obtained without intravenous contrast. Angiographic images of the Circle of Willis were obtained using MRA technique without intravenous contrast. Angiographic images of the neck were obtained using MRA technique without intravenous contrast. Carotid stenosis measurements (when applicable) are obtained utilizing NASCET criteria, using the distal internal  carotid diameter as the denominator. COMPARISON:  None. FINDINGS: MRI HEAD FINDINGS Brain: Acute or early subacute cortical and subcortical infarction in the right occipital lobe. Subacute appearing left periatrial white matter infarct that is small. Moderate size remote superior right frontal infarct. Cerebral  volume loss in keeping with age. Mild petechial hemorrhage at the right occipital infarction. No hydrocephalus, hematoma, or mass. Vascular: See below Skull and upper cervical spine: Normal marrow signal Sinuses/Orbits: Bilateral cataract resection MRA HEAD FINDINGS Atheromatous irregularity of anterior and posterior circulation vessels that is mild for age. No branch occlusion, beading, or flow limiting stenosis. Symmetric flow seen in the posterior cerebral arteries. Mildly lobulated aneurysm extending inferiorly from the supraclinoid left ICA measuring 3.6 mm in length. MRA NECK FINDINGS Antegrade flow in the carotid and vertebral arteries. No flow limiting stenosis seen in the carotid or vertebral circulation. IMPRESSION: Brain MRI: 1. Acute right occipital infarct with mild petechial hemorrhage. 2. Small subacute left periatrial white matter infarct. 3. Remote right frontal infarct. Intracranial MRA: 1. No emergent finding or flow limiting stenosis of major vessels. 2. 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 Neck MRA: Negative. Electronically Signed   By: Jorje Guild M.D.   On: 01/30/2021 07:56   MR BRAIN WO CONTRAST  Result Date: 01/30/2021 CLINICAL DATA:  Acute infarct by head CT. EXAM: MRI HEAD WITHOUT CONTRAST MRA HEAD WITHOUT CONTRAST MRA NECK WITHOUT CONTRAST TECHNIQUE: Multiplanar, multiecho pulse sequences of the brain and surrounding structures were obtained without intravenous contrast. Angiographic images of the Circle of Willis were obtained using MRA technique without intravenous contrast. Angiographic images of the neck were obtained using MRA technique without intravenous contrast. Carotid stenosis measurements (when applicable) are obtained utilizing NASCET criteria, using the distal internal carotid diameter as the denominator. COMPARISON:  None. FINDINGS: MRI HEAD FINDINGS Brain: Acute or early subacute cortical and subcortical infarction in the right occipital lobe. Subacute appearing  left periatrial white matter infarct that is small. Moderate size remote superior right frontal infarct. Cerebral volume loss in keeping with age. Mild petechial hemorrhage at the right occipital infarction. No hydrocephalus, hematoma, or mass. Vascular: See below Skull and upper cervical spine: Normal marrow signal Sinuses/Orbits: Bilateral cataract resection MRA HEAD FINDINGS Atheromatous irregularity of anterior and posterior circulation vessels that is mild for age. No branch occlusion, beading, or flow limiting stenosis. Symmetric flow seen in the posterior cerebral arteries. Mildly lobulated aneurysm extending inferiorly from the supraclinoid left ICA measuring 3.6 mm in length. MRA NECK FINDINGS Antegrade flow in the carotid and vertebral arteries. No flow limiting stenosis seen in the carotid or vertebral circulation. IMPRESSION: Brain MRI: 1. Acute right occipital infarct with mild petechial hemorrhage. 2. Small subacute left periatrial white matter infarct. 3. Remote right frontal infarct. Intracranial MRA: 1. No emergent finding or flow limiting stenosis of major vessels. 2. 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 Neck MRA: Negative. Electronically Signed   By: Jorje Guild M.D.   On: 01/30/2021 07:56   DG Hand Complete Right  Result Date: 01/30/2021 CLINICAL DATA:  Status post fall with pain to the third metacarpophalangeal joint. EXAM: RIGHT HAND - COMPLETE 3+ VIEW COMPARISON:  None. FINDINGS: There is no evidence of an acute fracture or dislocation. Degenerative changes seen along the carpometacarpal articulation of the right thumb. Soft tissues are unremarkable. IMPRESSION: No acute fracture or dislocation. Electronically Signed   By: Virgina Norfolk M.D.   On: 01/30/2021 01:28

## 2021-01-30 NOTE — ED Notes (Signed)
MRI transport team will be bringing pt up to 3W once scan is complete.

## 2021-01-30 NOTE — ED Notes (Signed)
ED TO INPATIENT HANDOFF REPORT  ED Nurse Name and Phone #: Bryson Ha [phone]  S Name/Age/Gender Tracy [name] 87 y.o. female Room/Bed: 032C/032C  Code Status   Code Status: Full Code  Home/SNF/Other Home Patient oriented to: self, place, time, and situation (sometimes confused with situation) Is this baseline? 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 States pt was getting up to go to the bathroom. Pt had difficulty getting out of her chair. Pt has recently had difficulty standing and walking. Pt had an unwitnessed fall and found on the floor. Family reports another fall Saturday, also unwitnessed. Pt denies any pain.   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 No pertinent surgical history.   A IV Location/Drains/Wounds Patient Lines/Drains/Airways Status     Active Line/Drains/Airways      Name Placement date Placement time Site Days   Peripheral IV 01/30/21 20 G Anterior;Right Forearm 01/30/21  0324  Forearm  less than 1            Intake/Output Last 24 hours No intake or output data in the 24 hours ending 01/30/21 0458  Labs/Imaging Results for orders placed or performed during the hospital encounter of 01/29/21 (from the past 48 hour(s))  CBC with Differential     Status: Abnormal   Collection Time: 01/29/21 10:30 PM  Result Value Ref Range   WBC 11.2 (H) 4.0 - 10.5 K/uL   RBC 3.57 (L) 3.87 - 5.11 MIL/uL   Hemoglobin 10.5 (L) 12.0 - 15.0 g/dL   HCT 32.0 (L) 36.0 - 46.0 %   MCV 89.6 80.0 - 100.0 fL   MCH 29.4 26.0 - 34.0 pg   MCHC 32.8 30.0 - 36.0 g/dL   RDW 13.1 11.5 - 15.5 %   Platelets 266 150 - 400 K/uL   nRBC 0.0 0.0 - 0.2 %   Neutrophils Relative % 78 %   Neutro Abs 8.7 (H) 1.7 - 7.7 K/uL   Lymphocytes Relative 15 %   Lymphs Abs 1.7 0.7 - 4.0 K/uL   Monocytes Relative 6 %   Monocytes Absolute 0.7 0.1 - 1.0 K/uL   Eosinophils Relative 0 %   Eosinophils Absolute 0.0 0.0 - 0.5 K/uL   Basophils Relative 0 %   Basophils Absolute 0.0 0.0 - 0.1 K/uL   Immature Granulocytes 1 %   Abs Immature Granulocytes 0.06 0.00 - 0.07 K/uL  Comment: Performed at [facility], [place] [address], [place], [place] [address]  Comprehensive metabolic panel     Status: Abnormal   Collection Time: 01/29/21 10:30 PM  Result Value Ref Range   Sodium 136 135 - 145 mmol/L   Potassium 3.8 3.5 - 5.1 mmol/L    Comment: SLIGHT HEMOLYSIS   Chloride 102 98 - 111 mmol/L   CO2 22 22 - 32 mmol/L   Glucose, Bld 118 (H) 70 - 99 mg/dL    Comment: Glucose reference range applies only to samples taken after fasting for at least 8 hours.   BUN 37 (H) 8 - 23 mg/dL   Creatinine, Ser 2.38 (H) 0.44 - 1.00 mg/dL   Calcium 8.8 (L) 8.9 - 10.3 mg/dL   Total Protein 6.6 6.5 - 8.1 g/dL   Albumin 3.5 3.5 - 5.0 g/dL   AST 25 15 - 41 U/L   ALT 7 0 - 44 U/L   Alkaline Phosphatase 74 38  - 126 U/L   Total Bilirubin 0.8 0.3 - 1.2 mg/dL   GFR, Estimated 18 (L) >60 mL/min    Comment: (NOTE) Calculated using the CKD-EPI Creatinine Equation (2021)    Anion gap 12 5 - 15    Comment: Performed at [facility] [address], [place], [place] [address]  Resp Panel by RT-PCR (Flu A&B, Covid) Nasopharyngeal Swab     Status: None   Collection Time: 01/30/21 12:05 AM   Specimen: Nasopharyngeal Swab; Nasopharyngeal(NP) swabs in vial transport medium  Result Value Ref Range   SARS Coronavirus 2 by RT PCR NEGATIVE NEGATIVE    Comment: (NOTE) SARS-CoV-2 target nucleic acids are NOT DETECTED.  The SARS-CoV-2 RNA is generally detectable in upper respiratory specimens during the acute phase of infection. The lowest concentration of SARS-CoV-2 viral copies this assay can detect is 138 copies/mL. A negative result does not preclude SARS-Cov-2 infection and should not be used as the sole basis for treatment or other patient management decisions. A negative result may occur with  improper specimen collection/handling, submission of specimen other than nasopharyngeal swab, presence of viral mutation(s) within the areas targeted by this assay, and inadequate number of viral copies(<138 copies/mL). A negative result must be combined with clinical observations, patient history, and epidemiological information. The expected result is Negative.  Fact Sheet for Patients:  EntrepreneurPulse.com.au  Fact Sheet for Healthcare Providers:  IncredibleEmployment.be  This test is no t yet approved or cleared by the [place] FDA and  has been authorized for detection and/or diagnosis of SARS-CoV-2 by FDA under an Emergency Use Authorization (EUA). This EUA will remain  in effect (meaning this test can be used) for the duration of the COVID-19 declaration under Section 564(b)(1) of the Act, 21 U.S.C.section 360bbb-3(b)(1), unless the authorization is  terminated  or revoked sooner.       Influenza A by PCR NEGATIVE NEGATIVE   Influenza B by PCR NEGATIVE NEGATIVE    Comment: (NOTE) The Xpert Xpress SARS-CoV-2/FLU/RSV plus assay is intended as an aid in the diagnosis of influenza from Nasopharyngeal swab specimens and should not be used as a sole basis for treatment. Nasal washings and aspirates are unacceptable for Xpert Xpress SARS-CoV-2/FLU/RSV testing.  Fact Sheet for Patients: EntrepreneurPulse.com.au  Fact Sheet for Healthcare Providers: IncredibleEmployment.be  This test is not yet approved or cleared by the [place] FDA and has been authorized for detection and/or diagnosis of SARS-CoV-2 by FDA under an Emergency Use Authorization (EUA). This  EUA will remain in effect (meaning this test can be used) for the duration of the COVID-19 declaration under Section 564(b)(1) of the Act, 21 U.S.C. section 360bbb-3(b)(1), unless the authorization is terminated or revoked.  Performed at [facility], [place] [address] [place], [place] [address]   Urinalysis, Routine w reflex microscopic     Status: None   Collection Time: 01/30/21  3:10 AM  Result Value Ref Range   Color, Urine YELLOW YELLOW   APPearance CLEAR CLEAR   Specific Gravity, Urine 1.006 1.005 - 1.030   pH 5.0 5.0 - 8.0   Glucose, UA NEGATIVE NEGATIVE mg/dL   Hgb urine dipstick NEGATIVE NEGATIVE   Bilirubin Urine NEGATIVE NEGATIVE   Ketones, ur NEGATIVE NEGATIVE mg/dL   Protein, ur NEGATIVE NEGATIVE mg/dL   Nitrite NEGATIVE NEGATIVE   Leukocytes,Ua NEGATIVE NEGATIVE    Comment: Performed at [facility] [address], [place], [place] [address]  Hemoglobin A1c     Status: Abnormal   Collection Time: 01/30/21  3:17 AM  Result Value Ref Range   Hgb A1c MFr Bld 6.0 (H) 4.8 - 5.6 %    Comment: (NOTE) Pre diabetes:          5.7%-6.4%  Diabetes:              >6.4%  Glycemic control for   <7.0% adults with  diabetes    Mean Plasma Glucose 125.5 mg/dL    Comment: Performed at [facility] [address] [place], [place] [address]  Lipid panel     Status: None   Collection Time: 01/30/21  3:17 AM  Result Value Ref Range   Cholesterol 171 0 - 200 mg/dL   Triglycerides 71 <150 mg/dL   HDL 78 >40 mg/dL   Total CHOL/HDL Ratio 2.2 RATIO   VLDL 14 0 - 40 mg/dL   LDL Cholesterol 79 0 - 99 mg/dL    Comment:        Total Cholesterol/HDL:CHD Risk Coronary Heart Disease Risk Table                     Men   Women  1/2 Average Risk   3.4   3.3  Average Risk       5.0   4.4  2 X Average Risk   9.6   7.1  3 X Average Risk  23.4   11.0        Use the calculated Patient Ratio above and the CHD Risk Table to determine the patient's CHD Risk.        ATP III CLASSIFICATION (LDL):  <100     mg/dL   Optimal  100-129  mg/dL   Near or Above                    Optimal  130-159  mg/dL   Borderline  160-189  mg/dL   High  >190     mg/dL   Very High Performed at [facility] [address], [place], [place] [address]   Comprehensive metabolic panel     Status: Abnormal   Collection Time: 01/30/21  3:17 AM  Result Value Ref Range   Sodium 137 135 - 145 mmol/L   Potassium 3.5 3.5 - 5.1 mmol/L   Chloride 103 98 - 111 mmol/L   CO2 24 22 - 32 mmol/L   Glucose, Bld 104 (H) 70 - 99 mg/dL  Comment: Glucose reference range applies only to samples taken after fasting for at least 8 hours.   BUN 33 (H) 8 - 23 mg/dL   Creatinine, Ser 2.19 (H) 0.44 - 1.00 mg/dL   Calcium 8.9 8.9 - 10.3 mg/dL   Total Protein 6.5 6.5 - 8.1 g/dL   Albumin 3.4 (L) 3.5 - 5.0 g/dL   AST 28 15 - 41 U/L   ALT 12 0 - 44 U/L   Alkaline Phosphatase 72 38 - 126 U/L   Total Bilirubin 0.6 0.3 - 1.2 mg/dL   GFR, Estimated 20 (L) >60 mL/min    Comment: (NOTE) Calculated using the CKD-EPI Creatinine Equation (2021)    Anion gap 10 5 - 15    Comment: Performed at [facility] [address] [place], [place]  [address]  CBC WITH DIFFERENTIAL     Status: Abnormal   Collection Time: 01/30/21  3:17 AM  Result Value Ref Range   WBC 12.1 (H) 4.0 - 10.5 K/uL   RBC 3.70 (L) 3.87 - 5.11 MIL/uL   Hemoglobin 10.5 (L) 12.0 - 15.0 g/dL   HCT 33.2 (L) 36.0 - 46.0 %   MCV 89.7 80.0 - 100.0 fL   MCH 28.4 26.0 - 34.0 pg   MCHC 31.6 30.0 - 36.0 g/dL   RDW 13.2 11.5 - 15.5 %   Platelets 260 150 - 400 K/uL   nRBC 0.0 0.0 - 0.2 %   Neutrophils Relative % 59 %   Neutro Abs 7.1 1.7 - 7.7 K/uL   Lymphocytes Relative 30 %   Lymphs Abs 3.7 0.7 - 4.0 K/uL   Monocytes Relative 10 %   Monocytes Absolute 1.2 (H) 0.1 - 1.0 K/uL   Eosinophils Relative 1 %   Eosinophils Absolute 0.1 0.0 - 0.5 K/uL   Basophils Relative 0 %   Basophils Absolute 0.0 0.0 - 0.1 K/uL   Immature Granulocytes 0 %   Abs Immature Granulocytes 0.04 0.00 - 0.07 K/uL    Comment: Performed at [facility], [place] [address] [place], [place] [address]  Magnesium     Status: None   Collection Time: 01/30/21  3:17 AM  Result Value Ref Range   Magnesium 2.1 1.7 - 2.4 mg/dL    Comment: Performed at [facility] [address], [place], [place] [address]   CT Head Wo Contrast  Result Date: 01/29/2021 CLINICAL DATA:  Head trauma. EXAM: CT HEAD WITHOUT CONTRAST TECHNIQUE: Contiguous axial images were obtained from the base of the skull through the vertex without intravenous contrast. COMPARISON:  CT head report only from 03/10/2001. FINDINGS: Brain: There is hypodensity with loss of gray-white matter distinction in the right occipital region. Gray-white matter distinction is otherwise preserved. No evidence of hemorrhage, hydrocephalus, extra-axial collection or mass lesion/mass effect. There is mild diffuse atrophy. There is a small old infarct in the right frontal lobe white matter. Prominent bilateral basal ganglia calcifications are seen. There is mild periventricular white matter hypodensity, likely chronic small vessel ischemic change.  Vascular: No hyperdense vessel or unexpected calcification. Skull: Normal. Negative for fracture or focal lesion. Sinuses/Orbits: No acute finding. Other: None. IMPRESSION: 1. 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 No hemorrhage or midline shift. 2. Mild diffuse atrophy and mild chronic small vessel ischemic change. 3. These results were called by telephone at the time of interpretation on 01/29/2021 at 11:05 pm to provider Dr. Pearline Cables, who verbally acknowledged these results. Electronically Signed  By: [name] M.D.   On: 01/29/2021 23:06   DG Hand Complete Right  Result Date: 01/30/2021 CLINICAL DATA:  Status post fall with pain to the third metacarpophalangeal joint. EXAM: RIGHT HAND - COMPLETE 3+ VIEW COMPARISON:  None. FINDINGS: There is no evidence of an acute fracture or dislocation. Degenerative changes seen along the carpometacarpal articulation of the right thumb. Soft tissues are unremarkable. IMPRESSION: No acute fracture or dislocation. Electronically Signed   By: Virgina Norfolk M.D.   On: 01/30/2021 01:28    Pending Labs Unresulted Labs (From admission, onward)    None       Vitals/Pain Today's Vitals   01/29/21 2328 01/30/21 0030 01/30/21 0245 01/30/21 0315  BP: 139/83 (!) 145/77 132/73   Pulse: (!) 102 96 91   Resp: (!) 25 (!) 23 16   Temp:      TempSrc:      SpO2: 100% 99% 95%   Weight:      Height:      PainSc:    0-No pain    Isolation Precautions No active isolations  Medications Medications   stroke: mapping our early stages of recovery book (has no administration in time range)  heparin injection 5,000 Units (has no administration in time range)  polyethylene glycol (MIRALAX / GLYCOLAX) packet 17 g (has no administration in time range)  acetaminophen (TYLENOL) tablet 650 mg (has no administration in time range)    Or  acetaminophen (TYLENOL) suppository 650 mg (has no administration in time range)  ondansetron (ZOFRAN) tablet  4 mg (has no administration in time range)    Or  ondansetron (ZOFRAN) injection 4 mg (has no administration in time range)  clopidogrel (PLAVIX) tablet 300 mg (300 mg Oral Given 01/30/21 0214)    And  clopidogrel (PLAVIX) tablet 75 mg (has no administration in time range)  aspirin EC tablet 81 mg (has no administration in time range)    Mobility walks with device, high fall risk due to hx of falls Moderate fall risk   Focused Assessments Neuro Assessment Handoff:  Swallow screen pass? Yes    NIH Stroke Scale ( + Modified Stroke Scale Criteria)  LOC Questions (1b. )   +: Answers both questions correctly LOC Commands (1c. )   + : Performs both tasks correctly Best Gaze (2. )  +: Normal Visual (3. )  +: No visual loss Motor Arm, Left (5a. )   +: No drift Motor Arm, Right (5b. )   +: No drift Motor Leg, Left (6a. )   +: No drift Motor Leg, Right (6b. )   +: No drift Sensory (8. )   +: Normal, no sensory loss Best Language (9. )   +: No aphasia Extinction/Inattention (11.)   +: No Abnormality Modified SS Total  +: 0     Neuro Assessment:   Neuro Checks:      Last Documented NIHSS Modified Score: 0 (01/30/21 0325) Has TPA been given? No If patient is a Neuro Trauma and patient is going to OR before floor call report to [facility] nurse: [phone] or [phone]   R Recommendations: See Admitting Provider Note  Report given to: Hollice Espy [phone]  Additional Notes: Pt in MRI currently, uses [facility]

## 2021-01-30 NOTE — Assessment & Plan Note (Signed)
.   Titrate statin therapy to achieve LDL less than 70

## 2021-01-30 NOTE — H&P (Signed)
History and Physical    Tracy [name] JOA:[id number] DOB: 10/14/1920 DOA: 01/29/2021  PCP: Jilda Panda, MD  Patient coming from: Home brought in by neice   Chief Complaint:  Chief Complaint  Patient presents with   Fall     HPI:    87 year old female with past medical history of hyperlipidemia, hypertension, chronic kidney disease stage IV and anemia of chronic disease who presents to [facility] emergency department with her niece with sudden fall.  History is been obtained from the patient who is an excellent historian.  Patient explains that on Saturday she began to start stumbling when walking.  Despite this occurrence, patient reports that she was able to "go about my business."  However, patient continued to have problems with stumbling and walking around her place of residence.  Patient denied any lightheadedness, headaches, focal weakness, slurring of her words or headaches over the span of time.  Patient denies any fevers, sick contacts, recent travel.  Patient's difficulty with ambulation continued to progress and earlier in the day on 10/25 patient was attempting to get out of her chair when she unfortunately fell again, since unwitnessed.  Patient was found down lying on the floor at approximately 6 PM the evening of 10/25 by her niece.    Due to recurrent falls and suspected weakness patient was brought into [facility] emergency department for evaluation.  Upon evaluation in the emergency department initial physical exam revealed a left visual field deficit prompting concern for stroke.  Noncontrast CT head was performed revealing findings compatible with an acute right PCA distribution stroke.  ER provider discussed the case with Dr. Leonel Ramsay with neurology who agreed to see the patient in consultation.  Patient was noted to be outside of the tPA window.  The hospitalist group was then called to assess the patient for admission to the  hospital.  Review of Systems:   Review of Systems  Eyes:  Positive for blurred vision.  Musculoskeletal:  Positive for falls.  All other systems reviewed and are negative.  Past Medical History:  Diagnosis Date   Anemia due to stage 4 chronic kidney disease ([place]) 01/30/2021   Chronic kidney disease, stage 4 (severe) ([place]) 01/30/2021   Essential hypertension 01/30/2021   Mixed hyperlipidemia 01/30/2021    History reviewed. No pertinent surgical history.   reports that she has never smoked. She has never used smokeless tobacco. She reports that she does not use drugs. No history on file for alcohol use.  No Known Allergies  Family History  Problem Relation Age of Onset   Heart disease Neg Hx      Prior to Admission medications   Not on File    Physical Exam: Vitals:   01/30/21 0030 01/30/21 0245 01/30/21 0639 01/30/21 0803  BP: (!) 145/77 132/73 (!) 152/66 139/66  Pulse: 96 91 90 88  Resp: (!) 23 16 (!) 21 20  Temp:   98.2 F (36.8 C) 98 F (36.7 C)  TempSrc:   Oral Oral  SpO2: 99% 95% 100% 100%  Weight:      Height:        Constitutional: Awake alert and oriented x3, no associated distress.   Skin: no rashes, no lesions, poor skin turgor noted. Eyes: Pupils are equally reactive to light.  No evidence of scleral icterus or conjunctival pallor.  ENMT: Moist mucous membranes noted.  Posterior pharynx clear of any exudate or lesions.   Neck: normal, supple, no masses, no thyromegaly.  No evidence of jugular venous distension.   Respiratory: clear to auscultation bilaterally, no wheezing, no crackles. Normal respiratory effort. No accessory muscle use.  Cardiovascular: Regular rate and rhythm, no murmurs / rubs / gallops. No extremity edema. 2+ pedal pulses. No carotid bruits.  Chest:   Nontender without crepitus or deformity.   Back:   Nontender without crepitus or deformity. Abdomen: Abdomen is soft and nontender.  No evidence of intra-abdominal masses.  Positive  bowel sounds noted in all quadrants.   Musculoskeletal: No joint deformity upper and lower extremities. Good ROM, no contractures. Normal muscle tone.  Neurologic: Notable left homonymous hemianopsia.  Remainder of cranial nerves intact.  Sensation intact.  Patient moving all 4 extremities spontaneously.  Patient is following all commands.  Patient is responsive to verbal stimuli.   Psychiatric: Patient exhibits normal mood with appropriate affect.  Patient seems to possess insight as to their current situation.     Labs on Admission: I have personally reviewed following labs and imaging studies -   CBC: Recent Labs  Lab 01/29/21 2230 01/30/21 0317  WBC 11.2* 12.1*  NEUTROABS 8.7* 7.1  HGB 10.5* 10.5*  HCT 32.0* 33.2*  MCV 89.6 89.7  PLT 266 546   Basic Metabolic Panel: Recent Labs  Lab 01/29/21 2230 01/30/21 0317  NA 136 137  K 3.8 3.5  CL 102 103  CO2 22 24  GLUCOSE 118* 104*  BUN 37* 33*  CREATININE 2.38* 2.19*  CALCIUM 8.8* 8.9  MG  --  2.1   GFR: Estimated Creatinine Clearance: 8.7 mL/min (A) (by C-G formula based on SCr of 2.19 mg/dL (H)). Liver Function Tests: Recent Labs  Lab 01/29/21 2230 01/30/21 0317  AST 25 28  ALT 7 12  ALKPHOS 74 72  BILITOT 0.8 0.6  PROT 6.6 6.5  ALBUMIN 3.5 3.4*   No results for input(s): LIPASE, AMYLASE in the last 168 hours. No results for input(s): AMMONIA in the last 168 hours. Coagulation Profile: No results for input(s): INR, PROTIME in the last 168 hours. Cardiac Enzymes: No results for input(s): CKTOTAL, CKMB, CKMBINDEX, TROPONINI in the last 168 hours. BNP (last 3 results) No results for input(s): PROBNP in the last 8760 hours. HbA1C: Recent Labs    01/30/21 0317  HGBA1C 6.0*   CBG: No results for input(s): GLUCAP in the last 168 hours. Lipid Profile: Recent Labs    01/30/21 0317  CHOL 171  HDL 78  LDLCALC 79  TRIG 71  CHOLHDL 2.2   Thyroid Function Tests: No results for input(s): TSH, T4TOTAL,  FREET4, T3FREE, THYROIDAB in the last 72 hours. Anemia Panel: No results for input(s): VITAMINB12, FOLATE, FERRITIN, TIBC, IRON, RETICCTPCT in the last 72 hours. Urine analysis:    Component Value Date/Time   COLORURINE YELLOW 01/30/2021 0310   APPEARANCEUR CLEAR 01/30/2021 0310   LABSPEC 1.006 01/30/2021 0310   PHURINE 5.0 01/30/2021 0310   GLUCOSEU NEGATIVE 01/30/2021 0310   HGBUR NEGATIVE 01/30/2021 0310   BILIRUBINUR NEGATIVE 01/30/2021 0310   KETONESUR NEGATIVE 01/30/2021 0310   PROTEINUR NEGATIVE 01/30/2021 0310   UROBILINOGEN 0.2 02/27/2009 2304   NITRITE NEGATIVE 01/30/2021 0310   LEUKOCYTESUR NEGATIVE 01/30/2021 0310    Radiological Exams on Admission - Personally Reviewed: CT Head Wo Contrast  Result Date: 01/29/2021 CLINICAL DATA:  Head trauma. EXAM: CT HEAD WITHOUT CONTRAST TECHNIQUE: Contiguous axial images were obtained from the base of the skull through the vertex without intravenous contrast. COMPARISON:  CT head report only from 03/10/2001. FINDINGS:  Brain: There is hypodensity with loss of gray-white matter distinction in the right occipital region. Gray-white matter distinction is otherwise preserved. No evidence of hemorrhage, hydrocephalus, extra-axial collection or mass lesion/mass effect. There is mild diffuse atrophy. There is a small old infarct in the right frontal lobe white matter. Prominent bilateral basal ganglia calcifications are seen. There is mild periventricular white matter hypodensity, likely chronic small vessel ischemic change. Vascular: No hyperdense vessel or unexpected calcification. Skull: Normal. Negative for fracture or focal lesion. Sinuses/Orbits: No acute finding. Other: None. IMPRESSION: 1. 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 No hemorrhage or midline shift. 2. Mild diffuse atrophy and mild chronic small vessel ischemic change. 3. These results were called by telephone at the time of interpretation on 01/29/2021 at  11:05 pm to provider Dr. Pearline Cables, who verbally acknowledged these results. Electronically Signed   By: [name] M.D.   On: 01/29/2021 23:06   MR ANGIO HEAD WO CONTRAST  Result Date: 01/30/2021 CLINICAL DATA:  Acute infarct by head CT. EXAM: MRI HEAD WITHOUT CONTRAST MRA HEAD WITHOUT CONTRAST MRA NECK WITHOUT CONTRAST TECHNIQUE: Multiplanar, multiecho pulse sequences of the brain and surrounding structures were obtained without intravenous contrast. Angiographic images of the Circle of Willis were obtained using MRA technique without intravenous contrast. Angiographic images of the neck were obtained using MRA technique without intravenous contrast. Carotid stenosis measurements (when applicable) are obtained utilizing NASCET criteria, using the distal internal carotid diameter as the denominator. COMPARISON:  None. FINDINGS: MRI HEAD FINDINGS Brain: Acute or early subacute cortical and subcortical infarction in the right occipital lobe. Subacute appearing left periatrial white matter infarct that is small. Moderate size remote superior right frontal infarct. Cerebral volume loss in keeping with age. Mild petechial hemorrhage at the right occipital infarction. No hydrocephalus, hematoma, or mass. Vascular: See below Skull and upper cervical spine: Normal marrow signal Sinuses/Orbits: Bilateral cataract resection MRA HEAD FINDINGS Atheromatous irregularity of anterior and posterior circulation vessels that is mild for age. No branch occlusion, beading, or flow limiting stenosis. Symmetric flow seen in the posterior cerebral arteries. Mildly lobulated aneurysm extending inferiorly from the supraclinoid left ICA measuring 3.6 mm in length. MRA NECK FINDINGS Antegrade flow in the carotid and vertebral arteries. No flow limiting stenosis seen in the carotid or vertebral circulation. IMPRESSION: Brain MRI: 1. Acute right occipital infarct with mild petechial hemorrhage. 2. Small subacute left periatrial white matter  infarct. 3. Remote right frontal infarct. Intracranial MRA: 1. No emergent finding or flow limiting stenosis of major vessels. 2. 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 Neck MRA: Negative. Electronically Signed   By: Jorje Guild M.D.   On: 01/30/2021 07:56   MR ANGIO NECK WO CONTRAST  Result Date: 01/30/2021 CLINICAL DATA:  Acute infarct by head CT. EXAM: MRI HEAD WITHOUT CONTRAST MRA HEAD WITHOUT CONTRAST MRA NECK WITHOUT CONTRAST TECHNIQUE: Multiplanar, multiecho pulse sequences of the brain and surrounding structures were obtained without intravenous contrast. Angiographic images of the Circle of Willis were obtained using MRA technique without intravenous contrast. Angiographic images of the neck were obtained using MRA technique without intravenous contrast. Carotid stenosis measurements (when applicable) are obtained utilizing NASCET criteria, using the distal internal carotid diameter as the denominator. COMPARISON:  None. FINDINGS: MRI HEAD FINDINGS Brain: Acute or early subacute cortical and subcortical infarction in the right occipital lobe. Subacute appearing left periatrial white matter infarct that is small. Moderate size remote superior right frontal infarct. Cerebral volume loss in keeping with age. Mild petechial  hemorrhage at the right occipital infarction. No hydrocephalus, hematoma, or mass. Vascular: See below Skull and upper cervical spine: Normal marrow signal Sinuses/Orbits: Bilateral cataract resection MRA HEAD FINDINGS Atheromatous irregularity of anterior and posterior circulation vessels that is mild for age. No branch occlusion, beading, or flow limiting stenosis. Symmetric flow seen in the posterior cerebral arteries. Mildly lobulated aneurysm extending inferiorly from the supraclinoid left ICA measuring 3.6 mm in length. MRA NECK FINDINGS Antegrade flow in the carotid and vertebral arteries. No flow limiting stenosis seen in the carotid or vertebral circulation. IMPRESSION:  Brain MRI: 1. Acute right occipital infarct with mild petechial hemorrhage. 2. Small subacute left periatrial white matter infarct. 3. Remote right frontal infarct. Intracranial MRA: 1. No emergent finding or flow limiting stenosis of major vessels. 2. 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 Neck MRA: Negative. Electronically Signed   By: Jorje Guild M.D.   On: 01/30/2021 07:56   MR BRAIN WO CONTRAST  Result Date: 01/30/2021 CLINICAL DATA:  Acute infarct by head CT. EXAM: MRI HEAD WITHOUT CONTRAST MRA HEAD WITHOUT CONTRAST MRA NECK WITHOUT CONTRAST TECHNIQUE: Multiplanar, multiecho pulse sequences of the brain and surrounding structures were obtained without intravenous contrast. Angiographic images of the Circle of Willis were obtained using MRA technique without intravenous contrast. Angiographic images of the neck were obtained using MRA technique without intravenous contrast. Carotid stenosis measurements (when applicable) are obtained utilizing NASCET criteria, using the distal internal carotid diameter as the denominator. COMPARISON:  None. FINDINGS: MRI HEAD FINDINGS Brain: Acute or early subacute cortical and subcortical infarction in the right occipital lobe. Subacute appearing left periatrial white matter infarct that is small. Moderate size remote superior right frontal infarct. Cerebral volume loss in keeping with age. Mild petechial hemorrhage at the right occipital infarction. No hydrocephalus, hematoma, or mass. Vascular: See below Skull and upper cervical spine: Normal marrow signal Sinuses/Orbits: Bilateral cataract resection MRA HEAD FINDINGS Atheromatous irregularity of anterior and posterior circulation vessels that is mild for age. No branch occlusion, beading, or flow limiting stenosis. Symmetric flow seen in the posterior cerebral arteries. Mildly lobulated aneurysm extending inferiorly from the supraclinoid left ICA measuring 3.6 mm in length. MRA NECK FINDINGS Antegrade flow in the  carotid and vertebral arteries. No flow limiting stenosis seen in the carotid or vertebral circulation. IMPRESSION: Brain MRI: 1. Acute right occipital infarct with mild petechial hemorrhage. 2. Small subacute left periatrial white matter infarct. 3. Remote right frontal infarct. Intracranial MRA: 1. No emergent finding or flow limiting stenosis of major vessels. 2. 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 Neck MRA: Negative. Electronically Signed   By: Jorje Guild M.D.   On: 01/30/2021 07:56   DG Hand Complete Right  Result Date: 01/30/2021 CLINICAL DATA:  Status post fall with pain to the third metacarpophalangeal joint. EXAM: RIGHT HAND - COMPLETE 3+ VIEW COMPARISON:  None. FINDINGS: There is no evidence of an acute fracture or dislocation. Degenerative changes seen along the carpometacarpal articulation of the right thumb. Soft tissues are unremarkable. IMPRESSION: No acute fracture or dislocation. Electronically Signed   By: Virgina Norfolk M.D.   On: 01/30/2021 01:28    EKG: Personally reviewed.  Rhythm is normal sinus rhythm with heart rate of 96 bpm.  No dynamic ST segment changes appreciated.  Assessment/Plan  * Acute stroke due to occlusion of left posterior cerebral artery [facility]) ED Imaging revealed: Acute right PCA distribution infarct on noncontrast CT imaging of the brain. On exam patient still exhibits the following deficit: Left  homonymous hemianopsia Performing serial neurologic checks Monitoring patient on telemetry Initiating aspirin therapy and Plavix therapy Lipitor 10 mg daily Further imaging to include: MRA of head and neck as well as MRI brain without contrast Obtaining hemoglobin A1c and lipid panel in the morning Echocardiogram in the morning PT, OT, SLP evaluation Permissive hypertension with as needed antihypertensives only to be given if blood pressure greater than 220/115 Neurology following in consultation.   Essential hypertension Permissive hypertension  for now PRN intravenous antihypertensives for excessively elevated blood pressure greater than 220/115    Chronic kidney disease, stage 4 (severe) ([facility]) Strict intake and output monitoring Creatinine near baseline Minimizing nephrotoxic agents as much as possible Serial chemistries to monitor renal function and electrolytes  Anemia due to stage 4 chronic kidney disease ([place]) No clinical evidence of bleeding Monitoring H&H with serial CBCs.   Mixed hyperlipidemia Titrate statin therapy to achieve LDL less than 70       Code Status:  DNR  code status decision has been confirmed with: patient Family Communication: Plan of care discussed with niece by ER staff  Status is: Inpatient  Remains inpatient appropriate because:   Acute stroke requiring close conical monitoring, serial neurologic checks, initiation of dual antiplatelet therapy, further evaluation via MRI, neurology consultation, PT OT, SLP evaluation.        Vernelle Emerald MD [facility] Pager [phone]  If 7PM-7AM, please contact night-coverage [email or web address] Use universal [place] password for that web site. If you do not have the password, please call the hospital operator.  01/30/2021, 8:31 AM

## 2021-01-30 NOTE — ED Notes (Signed)
Attempted to call report, 3W will be using purple man shortly per staff.

## 2021-01-30 NOTE — Progress Notes (Signed)
Pt sundowning, confused.  Thinks she is at home.  Keeps trying to ambulate in hallway.  Per daughter this happens some times.  Ordering PRN haldol.

## 2021-01-30 NOTE — Assessment & Plan Note (Addendum)
.   No clinical evidence of bleeding . Monitoring H&H with serial CBCs.

## 2021-01-30 NOTE — Consult Note (Addendum)
Neurology Consultation Reason for Consult: Stroke Referring Physician: Ralene Bathe, E  CC: Stroke  History is obtained from: Patient, daughter  HPI: Tracy [name] is a 87 y.o. female with a history of CKD, hypertension, hyperlipidemia who presents with more difficulty getting out of her chair today.  She has maybe had some falls over the past few days.  She denies noticing that she has had any more difficulty with bumping into objects on the left side.  She is unaware of her visual deficits.   LKW: Unclear tpa given?: no, unclear time of onset    ROS: A 14 point ROS was performed and is negative except as noted in the HPI.  Unable to obtain due to altered mental status.   Past Medical History:  Diagnosis Date   Anemia due to stage 4 chronic kidney disease ([place]) 01/30/2021   Chronic kidney disease, stage 4 (severe) ([place]) 01/30/2021   Essential hypertension 01/30/2021   Mixed hyperlipidemia 01/30/2021     Family History  Problem Relation Age of Onset   Heart disease Neg Hx      Social History:  reports that she has never smoked. She has never used smokeless tobacco. She reports that she does not use drugs. No history on file for alcohol use.   Exam: Current vital signs: BP (!) 145/77   Pulse 96   Temp 98.4 F (36.9 C) (Oral)   Resp (!) 23   Ht 4\' 9"  (1.448 m)   Wt 44 kg   SpO2 99%   BMI 20.99 kg/m  Vital signs in last 24 hours: Temp:  [98.4 F (36.9 C)] 98.4 F (36.9 C) (10/25 2102) Pulse Rate:  [92-102] 96 (10/26 0030) Resp:  [18-25] 23 (10/26 0030) BP: (137-145)/(70-83) 145/77 (10/26 0030) SpO2:  [99 %-100 %] 99 % (10/26 0030) Weight:  [44 kg] 44 kg (10/25 2152)   Physical Exam  Constitutional: Appears well-developed and well-nourished.  Psych: Affect appropriate to situation Eyes: No scleral injection HENT: No OP obstruction MSK: no joint deformities.  Cardiovascular: Normal rate and regular rhythm.  Respiratory: Effort normal, non-labored  breathing GI: Soft.  No distension. There is no tenderness.  Skin: WDI  Neuro: Mental Status: Patient is awake, alert, oriented to person, place, month, year, and situation. Patient is able to give a clear and coherent history. No signs of aphasia or neglect Cranial Nerves: II: Left hemianopia. Pupils are equal, round, and reactive to light.   III,IV, VI: EOMI without ptosis or diploplia.  V: Facial sensation is symmetric to temperature VII: Facial movement with?  Mild weakness of the left face VIII: hearing is intact to voice X: Uvula elevates symmetrically XI: Shoulder shrug is symmetric. XII: tongue is midline without atrophy or fasciculations.  Motor: She has 4/5 weakness of bilateral lower extremities, no drift in her upper extremities but does have difficulty with extension on the right. Sensory: Sensation is symmetric to light touch and temperature in the arms and legs. Cerebellar: No clear ataxia    I have reviewed labs in epic and the results pertinent to this consultation are: Cr 2.38  I have reviewed the images obtained: CT head-acute right PCA infarct  Impression: 87 year old female with acute right PCA infarct.  This could be thrombotic or embolic.  She is being admitted for therapy evaluations and secondary risk factor modification.  Her difficulty getting of a chair would be unusual for this location, and therefore I do think she needs further evaluation with MRI to look for  other strokes which could give clues to etiology.  Recommendations: - HgbA1c, fasting lipid panel - MRI of the brain without contrast, MRA head and neck - Frequent neuro checks - Echocardiogram - Prophylactic therapy-Antiplatelet med: Aspirin - dose 81mg  and plavix 75mg  daily  after 300mg  load  - Risk factor modification - Telemetry monitoring - PT consult, OT consult, Speech consult - Stroke team to follow    Roland Rack, MD [facility] [phone]  If 7pm-  7am, please page neurology on call as listed in [place].

## 2021-01-30 NOTE — Assessment & Plan Note (Signed)
Strict intake and output monitoring Creatinine near baseline Minimizing nephrotoxic agents as much as possible Serial chemistries to monitor renal function and electrolytes  

## 2021-01-30 NOTE — ED Notes (Signed)
Patient transported to MRI 

## 2021-01-30 NOTE — Evaluation (Signed)
Occupational Therapy Evaluation Patient Details Name: Tracy [name] MRN: [id number] DOB: 10/14/1920 Today's Date: 01/30/2021   History of Present Illness [name] is a 87 y.o. female with history of CKD, HTN and HLD presenting with difficulty arising from chair, frequent falls and left sided visual deficits.  MRI reveals acute right occipital infarct with mild petechial hemorrhage, small subacute periatrial white matter infarct and remote right frontal infarct.   Clinical Impression   Pt admitted for concerns listed above. PTA pt reported that she was independent with all ADL's and most IADL's. She has family that assist with medication management and community mobility. At this time, pt presents with a L sided field cut and balance deficits. Pt requiring supervision to min guard for all ADL's and functional mobility. She will benefit from [facility] to address vision compensatory techniques as well as maximize her return to independence with all ADL's. OT will follow acutely.       Recommendations for follow up therapy are one component of a multi-disciplinary discharge planning process, led by the attending physician.  Recommendations may be updated based on patient status, additional functional criteria and insurance authorization.   Follow Up Recommendations  Home health OT    Assistance Recommended at Discharge Intermittent Supervision/Assistance  Functional Status Assessment  Patient has had a recent decline in their functional status and demonstrates the ability to make significant improvements in function in a reasonable and predictable amount of time.  Equipment Recommendations  Other (comment) ([employer] RW)    Recommendations for Other Services       Precautions / Restrictions Precautions Precautions: Fall Precaution Comments: L visual field cut Restrictions Weight Bearing Restrictions: No      Mobility Bed Mobility Overal bed mobility: Needs  Assistance Bed Mobility: Supine to Sit;Sit to Supine     Supine to sit: Supervision;HOB elevated Sit to supine: Supervision   General bed mobility comments: up to EOB with assist for lines, to supine cues for positioning    Transfers Overall transfer level: Needs assistance Equipment used: Rolling walker (2 wheels) Transfers: Sit to/from Stand Sit to Stand: Min guard           General transfer comment: assist for safety and cues for hand placement      Balance Overall balance assessment: Needs assistance Sitting-balance support: Feet unsupported Sitting balance-Leahy Scale: Good Sitting balance - Comments: able to sit unsupported and feet not yet on the floor, reaching to feet from sitting no LOB no dizziness   Standing balance support: Single extremity supported;No upper extremity supported Standing balance-Leahy Scale: Fair Standing balance comment: static balance with S no UE support, needs walker for ambulation                           ADL either performed or assessed with clinical judgement   ADL Overall ADL's : Needs assistance/impaired                                       General ADL Comments: Pt requiring supervision to min guard for all ADL's due to L visual field cut and safety concerns     Vision Baseline Vision/History: 0 No visual deficits Ability to See in Adequate Light: 1 Impaired Patient Visual Report: No change from baseline Vision Assessment?: Yes Ocular Range of Motion: Within Functional Limits Alignment/Gaze Preference: Within Defined Limits  Tracking/Visual Pursuits: Decreased smoothness of horizontal tracking Visual Fields: Left visual field deficit Depth Perception: Overshoots     Perception     Praxis      Pertinent Vitals/Pain Pain Assessment: No/denies pain     Hand Dominance Right   Extremity/Trunk Assessment Upper Extremity Assessment Upper Extremity Assessment: Overall WFL for tasks assessed    Lower Extremity Assessment Lower Extremity Assessment: Defer to PT evaluation LLE Deficits / Details: testing WFL, but note slower moving L with ambulation LLE Coordination: decreased gross motor   Cervical / Trunk Assessment Cervical / Trunk Assessment: Kyphotic   Communication Communication Communication: No difficulties   Cognition Arousal/Alertness: Awake/alert Behavior During Therapy: WFL for tasks assessed/performed Overall Cognitive Status: Impaired/Different from baseline Area of Impairment: Orientation;Safety/judgement;Attention;Memory                 Orientation Level: Disoriented to;Time;Situation Current Attention Level: Selective Memory: Decreased short-term memory   Safety/Judgement: Decreased awareness of safety;Decreased awareness of deficits     General Comments: unaware of diagnosis; though knows she fell, not aware of L visual field deficit despite education     General Comments  family in the room and supportive and report pt will have assist at d/c.    Exercises     Shoulder Instructions      Home Living Family/patient expects to be discharged to:: Private residence Living Arrangements: Alone Available Help at Discharge: Family;Available 24 hours/day Type of Home: House Home Access: Stairs to enter [employer] of Steps: 2 Entrance Stairs-Rails: Right;Left Home Layout: One level     Bathroom Shower/Tub: Teacher, early years/pre: Handicapped height Bathroom Accessibility: Yes How Accessible: Accessible via walker Home Equipment: Shower seat - built in          Prior Functioning/Environment Prior Level of Function : Independent/Modified Independent;History of Falls (last six months)             Mobility Comments: 1 fall PTA ADLs Comments: neice helps with medication management, groceries and driving        OT Problem List: Decreased strength;Decreased activity tolerance;Impaired balance (sitting and/or  standing);Impaired vision/perception;Decreased coordination;Decreased safety awareness;Decreased knowledge of use of DME or AE      OT Treatment/Interventions: Self-care/ADL training;Therapeutic exercise;Energy conservation;DME and/or AE instruction;Therapeutic activities;Balance training;Patient/family education;Visual/perceptual remediation/compensation    OT Goals(Current goals can be found in the care plan section) Acute Rehab OT Goals Patient Stated Goal: To go home OT Goal Formulation: With patient Time For Goal Achievement: 02/13/21 Potential to Achieve Goals: Good ADL Goals Pt Will Perform Grooming: with modified independence;standing Pt Will Perform Lower Body Bathing: with modified independence;sitting/lateral leans;sit to/from stand Pt Will Perform Lower Body Dressing: with modified independence;sitting/lateral leans;sit to/from stand Pt Will Transfer to Toilet: with modified independence;ambulating Pt Will Perform Tub/Shower Transfer: ambulating;with modified independence Additional ADL Goal #1: Pt will complete a pathfinding task with minimal cues and assist.  OT Frequency: Min 2X/week   Barriers to D/C:            Co-evaluation              AM-PAC OT "6 Clicks" Daily Activity     Outcome Measure Help from another person eating meals?: None Help from another person taking care of personal grooming?: A Little Help from another person toileting, which includes using toliet, bedpan, or urinal?: A Little Help from another person bathing (including washing, rinsing, drying)?: A Little Help from another person to put on and taking off regular upper body  clothing?: A Little Help from another person to put on and taking off regular lower body clothing?: A Little 6 Click Score: 19   End of Session Equipment Utilized During Treatment: Gait belt;Rolling walker (2 wheels) Nurse Communication: Mobility status  Activity Tolerance: Patient tolerated treatment well Patient  left: in bed;with call bell/phone within reach;with family/visitor present  OT Visit Diagnosis: Unsteadiness on feet (R26.81);Other abnormalities of gait and mobility (R26.89);Muscle weakness (generalized) (M62.81)                Time: 4259-5638 OT Time Calculation (min): 25 min Charges:  OT General Charges $OT Visit: 1 Visit OT Evaluation $OT Eval Moderate Complexity: 1 Mod OT Treatments $Self Care/Home Management : 8-22 mins  Tracy Bran H., OTR/L Acute Rehabilitation  Tracy [name] 01/30/2021, 4:38 PM

## 2021-01-30 NOTE — Evaluation (Signed)
Physical Therapy Evaluation Patient Details Name: Tracy [name] MRN: [id number] DOB: 10/14/1920 Today's Date: 01/30/2021  History of Present Illness  [name] is a 87 y.o. female with history of CKD, HTN and HLD presenting with difficulty arising from chair, frequent falls and left sided visual deficits.  MRI reveals acute right occipital infarct with mild petechial hemorrhage, small subacute periatrial white matter infarct and remote right frontal infarct.  Clinical Impression  Patient presents with decreased mobility due to L visual field deficits, decreased balance, decreased coordination on L, decreased balance and recent fall at home.  Family present and supportive.  States had help for groceries, driving and med management PTA.  Feel she should be able to return home with 24 hour assist and follow up HHPT.  PT to continue to follow acutely.      Recommendations for follow up therapy are one component of a multi-disciplinary discharge planning process, led by the attending physician.  Recommendations may be updated based on patient status, additional functional criteria and insurance authorization.  Follow Up Recommendations Home health PT    Assistance Recommended at Discharge Frequent or constant Supervision/Assistance  Functional Status Assessment    Equipment Recommendations  Rolling walker (2 wheels)    Recommendations for Other Services       Precautions / Restrictions Precautions Precautions: Fall Precaution Comments: L visual field cut      Mobility  Bed Mobility Overal bed mobility: Needs Assistance Bed Mobility: Supine to Sit;Sit to Supine     Supine to sit: Supervision;HOB elevated Sit to supine: Min guard   General bed mobility comments: up to EOB with assist for lines, to supine cues for positioning    Transfers Overall transfer level: Needs assistance Equipment used: Rolling walker (2 wheels) Transfers: Sit to/from Stand Sit to  Stand: Min guard           General transfer comment: assist for safety and cues for hand placement    Ambulation/Gait Ambulation/Gait assistance: Min guard Gait Distance (Feet): 150 Feet Assistive device: Rolling walker (2 wheels) Gait Pattern/deviations: Step-through pattern;Decreased stride length     General Gait Details: decreased foot clearance on L  Stairs            Wheelchair Mobility    Modified Rankin (Stroke Patients Only) Modified Rankin (Stroke Patients Only) Pre-Morbid Rankin Score: Slight disability Modified Rankin: Moderately severe disability     Balance Overall balance assessment: Needs assistance Sitting-balance support: Feet unsupported Sitting balance-Leahy Scale: Good Sitting balance - Comments: able to sit unsupported and feet not yet on the floor, reaching to feet from sitting no LOB no dizziness   Standing balance support: Single extremity supported;No upper extremity supported Standing balance-Leahy Scale: Fair Standing balance comment: static balance with S no UE support, needs walker for ambulation                             Pertinent Vitals/Pain Pain Assessment: No/denies pain    Home Living Family/patient expects to be discharged to:: Private residence Living Arrangements: Alone Available Help at Discharge: Family;Available 24 hours/day Type of Home: House Home Access: Stairs to enter Entrance Stairs-Rails: Psychiatric nurse of Steps: 2   Home Layout: One level Home Equipment: Shower seat - built in      Prior Function Prior Level of Function : Independent/Modified Independent;History of Falls (last six months)  Mobility Comments: 1 fall PTA ADLs Comments: neice helps with medication management, groceries and driving     Hand Dominance   Dominant Hand: Right    Extremity/Trunk Assessment   Upper Extremity Assessment Upper Extremity Assessment: Defer to OT evaluation     Lower Extremity Assessment Lower Extremity Assessment: LLE deficits/detail LLE Deficits / Details: testing WFL, but note slower moving L with ambulation LLE Coordination: decreased gross motor    Cervical / Trunk Assessment Cervical / Trunk Assessment: Kyphotic  Communication   Communication: No difficulties  Cognition Arousal/Alertness: Awake/alert Behavior During Therapy: WFL for tasks assessed/performed Overall Cognitive Status: Impaired/Different from baseline Area of Impairment: Orientation;Safety/judgement;Attention;Memory                 Orientation Level: Disoriented to;Time;Situation Current Attention Level: Selective Memory: Decreased short-term memory   Safety/Judgement: Decreased awareness of safety;Decreased awareness of deficits     General Comments: unaware of diagnosis; though knows she fell, not aware of L visual field deficit until she passed her room walking back with room on her L        General Comments General comments (skin integrity, edema, etc.): family in the room and supportive and report pt will have assist at d/c.    Exercises     Assessment/Plan    PT Assessment Patient needs continued PT services  PT Problem List Decreased strength;Decreased mobility;Decreased safety awareness;Decreased coordination;Decreased balance;Decreased knowledge of use of DME;Decreased cognition       PT Treatment Interventions DME instruction;Therapeutic activities;Gait training;Therapeutic exercise;Patient/family education;Cognitive remediation;Balance training;Functional mobility training;Stair training    PT Goals (Current goals can be found in the Care Plan section)  Acute Rehab PT Goals Patient Stated Goal: to go home PT Goal Formulation: With patient/family Time For Goal Achievement: 02/13/21 Potential to Achieve Goals: Good    Frequency Min 4X/week   Barriers to discharge        Co-evaluation               AM-PAC PT "6 Clicks"  Mobility  Outcome Measure Help needed turning from your back to your side while in a flat bed without using bedrails?: None Help needed moving from lying on your back to sitting on the side of a flat bed without using bedrails?: A Little Help needed moving to and from a bed to a chair (including a wheelchair)?: A Little Help needed standing up from a chair using your arms (e.g., wheelchair or bedside chair)?: A Little Help needed to walk in hospital room?: A Little Help needed climbing 3-5 steps with a railing? : A Little 6 Click Score: 19    End of Session Equipment Utilized During Treatment: Gait belt Activity Tolerance: Patient tolerated treatment well Patient left: in bed;with call bell/phone within reach;with family/visitor present   PT Visit Diagnosis: Other abnormalities of gait and mobility (R26.89);Other symptoms and signs involving the nervous system (R29.898);History of falling (Z91.81)    Time: 0102-7253 PT Time Calculation (min) (ACUTE ONLY): 24 min   Charges:   PT Evaluation $PT Eval Moderate Complexity: 1 Mod PT Treatments $Gait Training: 8-22 mins        Tracy [name], PT Acute Rehabilitation Services Pager:[phone] Office:[phone] 01/30/2021   Tracy [name] 01/30/2021, 4:06 PM

## 2021-01-30 NOTE — Progress Notes (Signed)
STROKE TEAM PROGRESS NOTE   INTERVAL HISTORY Patient is seen in room with her RN at the bedside. She has been hemodynamically stable. She states that she has not had a stroke before and has never been told that she has had any dysrhythmias or aneurysms. She denies weakness and numbness.   Vitals:   01/30/21 0030 01/30/21 0245 01/30/21 0639 01/30/21 0803  BP: (!) 145/77 132/73 (!) 152/66 139/66  Pulse: 96 91 90 88  Resp: (!) 23 16 (!) 21 20  Temp:   98.2 F (36.8 C) 98 F (36.7 C)  TempSrc:   Oral Oral  SpO2: 99% 95% 100% 100%  Weight:      Height:       CBC:  Recent Labs  Lab 01/29/21 2230 01/30/21 0317  WBC 11.2* 12.1*  NEUTROABS 8.7* 7.1  HGB 10.5* 10.5*  HCT 32.0* 33.2*  MCV 89.6 89.7  PLT 266 793   Basic Metabolic Panel:  Recent Labs  Lab 01/29/21 2230 01/30/21 0317  NA 136 137  K 3.8 3.5  CL 102 103  CO2 22 24  GLUCOSE 118* 104*  BUN 37* 33*  CREATININE 2.38* 2.19*  CALCIUM 8.8* 8.9  MG  --  2.1   Lipid Panel:  Recent Labs  Lab 01/30/21 0317  CHOL 171  TRIG 71  HDL 78  CHOLHDL 2.2  VLDL 14  LDLCALC 79   HgbA1c:  Recent Labs  Lab 01/30/21 0317  HGBA1C 6.0*   Urine Drug Screen: No results for input(s): LABOPIA, COCAINSCRNUR, LABBENZ, AMPHETMU, THCU, LABBARB in the last 168 hours.  Alcohol Level No results for input(s): ETH in the last 168 hours.  IMAGING past 24 hours CT Head Wo Contrast  Result Date: 01/29/2021 CLINICAL DATA:  Head trauma. EXAM: CT HEAD WITHOUT CONTRAST TECHNIQUE: Contiguous axial images were obtained from the base of the skull through the vertex without intravenous contrast. COMPARISON:  CT head report only from 03/10/2001. FINDINGS: Brain: There is hypodensity with loss of gray-white matter distinction in the right occipital region. Gray-white matter distinction is otherwise preserved. No evidence of hemorrhage, hydrocephalus, extra-axial collection or mass lesion/mass effect. There is mild diffuse atrophy. There is a small  old infarct in the right frontal lobe white matter. Prominent bilateral basal ganglia calcifications are seen. There is mild periventricular white matter hypodensity, likely chronic small vessel ischemic change. Vascular: No hyperdense vessel or unexpected calcification. Skull: Normal. Negative for fracture or focal lesion. Sinuses/Orbits: No acute finding. Other: None. IMPRESSION: 1. 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 No hemorrhage or midline shift. 2. Mild diffuse atrophy and mild chronic small vessel ischemic change. 3. These results were called by telephone at the time of interpretation on 01/29/2021 at 11:05 pm to provider Dr. Pearline Cables, who verbally acknowledged these results. Electronically Signed   By: [name] M.D.   On: 01/29/2021 23:06   MR ANGIO HEAD WO CONTRAST  Result Date: 01/30/2021 CLINICAL DATA:  Acute infarct by head CT. EXAM: MRI HEAD WITHOUT CONTRAST MRA HEAD WITHOUT CONTRAST MRA NECK WITHOUT CONTRAST TECHNIQUE: Multiplanar, multiecho pulse sequences of the brain and surrounding structures were obtained without intravenous contrast. Angiographic images of the Circle of Willis were obtained using MRA technique without intravenous contrast. Angiographic images of the neck were obtained using MRA technique without intravenous contrast. Carotid stenosis measurements (when applicable) are obtained utilizing NASCET criteria, using the distal internal carotid diameter as the denominator. COMPARISON:  None. FINDINGS: MRI HEAD FINDINGS Brain: Acute or early  subacute cortical and subcortical infarction in the right occipital lobe. Subacute appearing left periatrial white matter infarct that is small. Moderate size remote superior right frontal infarct. Cerebral volume loss in keeping with age. Mild petechial hemorrhage at the right occipital infarction. No hydrocephalus, hematoma, or mass. Vascular: See below Skull and upper cervical spine: Normal marrow signal  Sinuses/Orbits: Bilateral cataract resection MRA HEAD FINDINGS Atheromatous irregularity of anterior and posterior circulation vessels that is mild for age. No branch occlusion, beading, or flow limiting stenosis. Symmetric flow seen in the posterior cerebral arteries. Mildly lobulated aneurysm extending inferiorly from the supraclinoid left ICA measuring 3.6 mm in length. MRA NECK FINDINGS Antegrade flow in the carotid and vertebral arteries. No flow limiting stenosis seen in the carotid or vertebral circulation. IMPRESSION: Brain MRI: 1. Acute right occipital infarct with mild petechial hemorrhage. 2. Small subacute left periatrial white matter infarct. 3. Remote right frontal infarct. Intracranial MRA: 1. No emergent finding or flow limiting stenosis of major vessels. 2. 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 Neck MRA: Negative. Electronically Signed   By: Jorje Guild M.D.   On: 01/30/2021 07:56   MR ANGIO NECK WO CONTRAST  Result Date: 01/30/2021 CLINICAL DATA:  Acute infarct by head CT. EXAM: MRI HEAD WITHOUT CONTRAST MRA HEAD WITHOUT CONTRAST MRA NECK WITHOUT CONTRAST TECHNIQUE: Multiplanar, multiecho pulse sequences of the brain and surrounding structures were obtained without intravenous contrast. Angiographic images of the Circle of Willis were obtained using MRA technique without intravenous contrast. Angiographic images of the neck were obtained using MRA technique without intravenous contrast. Carotid stenosis measurements (when applicable) are obtained utilizing NASCET criteria, using the distal internal carotid diameter as the denominator. COMPARISON:  None. FINDINGS: MRI HEAD FINDINGS Brain: Acute or early subacute cortical and subcortical infarction in the right occipital lobe. Subacute appearing left periatrial white matter infarct that is small. Moderate size remote superior right frontal infarct. Cerebral volume loss in keeping with age. Mild petechial hemorrhage at the right occipital  infarction. No hydrocephalus, hematoma, or mass. Vascular: See below Skull and upper cervical spine: Normal marrow signal Sinuses/Orbits: Bilateral cataract resection MRA HEAD FINDINGS Atheromatous irregularity of anterior and posterior circulation vessels that is mild for age. No branch occlusion, beading, or flow limiting stenosis. Symmetric flow seen in the posterior cerebral arteries. Mildly lobulated aneurysm extending inferiorly from the supraclinoid left ICA measuring 3.6 mm in length. MRA NECK FINDINGS Antegrade flow in the carotid and vertebral arteries. No flow limiting stenosis seen in the carotid or vertebral circulation. IMPRESSION: Brain MRI: 1. Acute right occipital infarct with mild petechial hemorrhage. 2. Small subacute left periatrial white matter infarct. 3. Remote right frontal infarct. Intracranial MRA: 1. No emergent finding or flow limiting stenosis of major vessels. 2. 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 Neck MRA: Negative. Electronically Signed   By: Jorje Guild M.D.   On: 01/30/2021 07:56   MR BRAIN WO CONTRAST  Result Date: 01/30/2021 CLINICAL DATA:  Acute infarct by head CT. EXAM: MRI HEAD WITHOUT CONTRAST MRA HEAD WITHOUT CONTRAST MRA NECK WITHOUT CONTRAST TECHNIQUE: Multiplanar, multiecho pulse sequences of the brain and surrounding structures were obtained without intravenous contrast. Angiographic images of the Circle of Willis were obtained using MRA technique without intravenous contrast. Angiographic images of the neck were obtained using MRA technique without intravenous contrast. Carotid stenosis measurements (when applicable) are obtained utilizing NASCET criteria, using the distal internal carotid diameter as the denominator. COMPARISON:  None. FINDINGS: MRI HEAD FINDINGS Brain: Acute or early subacute  cortical and subcortical infarction in the right occipital lobe. Subacute appearing left periatrial white matter infarct that is small. Moderate size remote superior  right frontal infarct. Cerebral volume loss in keeping with age. Mild petechial hemorrhage at the right occipital infarction. No hydrocephalus, hematoma, or mass. Vascular: See below Skull and upper cervical spine: Normal marrow signal Sinuses/Orbits: Bilateral cataract resection MRA HEAD FINDINGS Atheromatous irregularity of anterior and posterior circulation vessels that is mild for age. No branch occlusion, beading, or flow limiting stenosis. Symmetric flow seen in the posterior cerebral arteries. Mildly lobulated aneurysm extending inferiorly from the supraclinoid left ICA measuring 3.6 mm in length. MRA NECK FINDINGS Antegrade flow in the carotid and vertebral arteries. No flow limiting stenosis seen in the carotid or vertebral circulation. IMPRESSION: Brain MRI: 1. Acute right occipital infarct with mild petechial hemorrhage. 2. Small subacute left periatrial white matter infarct. 3. Remote right frontal infarct. Intracranial MRA: 1. No emergent finding or flow limiting stenosis of major vessels. 2. 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 Neck MRA: Negative. Electronically Signed   By: Jorje Guild M.D.   On: 01/30/2021 07:56   DG Hand Complete Right  Result Date: 01/30/2021 CLINICAL DATA:  Status post fall with pain to the third metacarpophalangeal joint. EXAM: RIGHT HAND - COMPLETE 3+ VIEW COMPARISON:  None. FINDINGS: There is no evidence of an acute fracture or dislocation. Degenerative changes seen along the carpometacarpal articulation of the right thumb. Soft tissues are unremarkable. IMPRESSION: No acute fracture or dislocation. Electronically Signed   By: Virgina Norfolk M.D.   On: 01/30/2021 01:28    PHYSICAL EXAM General:  Patient is an alert, well-nourished female in no acute distress.   NEURO:  Mental Status: AA&Ox3  Speech/Language: speech is without dysarthria or aphasia.  Naming, repetition, fluency, and comprehension intact.  Cranial Nerves:  II: PERRL. Left sided hemianopia  and visual agnosia III, IV, VI: EOMI. Eyelids elevate symmetrically.  V: Sensation is intact to light touch and symmetrical to face.  VII: Smile is symmetrical. Able to puff cheeks and raise eyebrows.  VIII: hearing intact to voice. IX, X: Palate elevates symmetrically. Phonation is normal.  LT:[id number] shrug 5/5. XII: tongue is midline without fasciculations. Motor: 5/5 strength to all muscle groups tested.  Tone: is normal and bulk is normal Sensation- Intact to light touch bilaterally. Extinction absent to light touch to DSS. Sharp/Dull   Vibration.   Coordination: FTN intact bilaterally, HKS: no ataxia in BLE.No drift.  Gait- deferred    ASSESSMENT/PLAN [name] is a 87 y.o. female with history of CKD, HTN and HLD presenting with difficulty arising from chair, frequent falls and left sided visual deficits.  She is unaware of these deficits.  TNK was not given in ED as patient's LKW was unknown. MRI reveals acute right occipital infarct with mild petechial hemorrhage, small subacute periatrial white matter infarct and remote right frontal infarct.  No flow limiting stenosis seen in intracranial vessels. 3.86mm left supraclinoid aneurysm seen on MRA.  Given patient's age, will not perform intervention at this time.  Will begin DAPT and continue home dose of statin.  PT/OT to evaluate.  Stroke:  right occipital infarct secondary to unknown cause CT head Acute right PCA distribution infarct, no hemorrhage or midline shift.  Small vessel disease. Atrophy.  MRI  Acute right occipital infarct with mild petechial hemorrhage. Small subacute left periatrial white matter infarct. Remote right frontal infarct. MRA No emergent finding or flow limiting stenosis of major  vessels.  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  2D Echo pending LDL 79 HgbA1c 6.0 VTE prophylaxis - SQ heparin    Diet   Diet NPO time specified   No antithrombotic prior to admission, now on aspirin 81 mg daily  and clopidogrel 75 mg daily.  Therapy recommendations:  pending Disposition:  pending  Hypertension Home meds:  unknown at this time, patient does not recall names of her medications Stable Permissive hypertension (OK if < 220/120) but gradually normalize in 5-7 days Long-term BP goal normotensive  Hyperlipidemia Home meds:  unknown LDL 79, goal < 70 Add atorvastatin 10 mg daily High intensity statin deferred due to age Continue statin at discharge   Other Stroke Risk Factors Advanced Age >/= 20    Other Active Problems CKD Monitor BMP, Cr currently 2.19 Avoid IV contrast when possible Renally dose medications as appropriate  Hospital day # 1   Total of 30 mins spent reviewing chart, discussion with patient and family on prognosis, Dx and plan. Discussed case with patient's nurse. Reviewed Imaging personally.     To contact Stroke Continuity provider, please refer to [email or web address] After hours, contact General Neurology

## 2021-01-30 NOTE — Assessment & Plan Note (Signed)
.   Permissive hypertension for now . PRN intravenous antihypertensives for excessively elevated blood pressure greater than 220/115

## 2021-01-30 NOTE — Assessment & Plan Note (Signed)
   ED Imaging revealed: Acute right PCA distribution infarct on noncontrast CT imaging of the brain.  On exam patient still exhibits the following deficit: Left homonymous hemianopsia  Performing serial neurologic checks  Monitoring patient on telemetry  Initiating aspirin therapy and Plavix therapy  Lipitor 10 mg daily  Further imaging to include: MRA of head and neck as well as MRI brain without contrast  Obtaining hemoglobin A1c and lipid panel in the morning  Echocardiogram in the morning  PT, OT, SLP evaluation  Permissive hypertension with as needed antihypertensives only to be given if blood pressure greater than 220/115  Neurology following in consultation.

## 2021-01-31 DIAGNOSIS — N184 Chronic kidney disease, stage 4 (severe): Secondary | ICD-10-CM | POA: Diagnosis not present

## 2021-01-31 DIAGNOSIS — I639 Cerebral infarction, unspecified: Secondary | ICD-10-CM

## 2021-01-31 DIAGNOSIS — I10 Essential (primary) hypertension: Secondary | ICD-10-CM | POA: Diagnosis not present

## 2021-01-31 DIAGNOSIS — I63532 Cerebral infarction due to unspecified occlusion or stenosis of left posterior cerebral artery: Secondary | ICD-10-CM | POA: Diagnosis not present

## 2021-01-31 LAB — COMPREHENSIVE METABOLIC PANEL
ALT: 13 U/L (ref 0–44)
AST: 29 U/L (ref 15–41)
Albumin: 2.9 g/dL — ABNORMAL LOW (ref 3.5–5.0)
Alkaline Phosphatase: 57 U/L (ref 38–126)
Anion gap: 6 (ref 5–15)
BUN: 23 mg/dL (ref 8–23)
CO2: 27 mmol/L (ref 22–32)
Calcium: 8.8 mg/dL — ABNORMAL LOW (ref 8.9–10.3)
Chloride: 105 mmol/L (ref 98–111)
Creatinine, Ser: 2 mg/dL — ABNORMAL HIGH (ref 0.44–1.00)
GFR, Estimated: 22 mL/min — ABNORMAL LOW (ref >60)
Glucose, Bld: 106 mg/dL — ABNORMAL HIGH (ref 70–99)
Potassium: 3.3 mmol/L — ABNORMAL LOW (ref 3.5–5.1)
Sodium: 138 mmol/L (ref 135–145)
Total Bilirubin: 0.4 mg/dL (ref 0.3–1.2)
Total Protein: 5.8 g/dL — ABNORMAL LOW (ref 6.5–8.1)

## 2021-01-31 LAB — CBC WITH DIFFERENTIAL/PLATELET
Abs Immature Granulocytes: 0.03 10*3/uL (ref 0.00–0.07)
Basophils Absolute: 0 10*3/uL (ref 0.0–0.1)
Basophils Relative: 0 %
Eosinophils Absolute: 0.2 10*3/uL (ref 0.0–0.5)
Eosinophils Relative: 1 %
HCT: 31.5 % — ABNORMAL LOW (ref 36.0–46.0)
Hemoglobin: 10.5 g/dL — ABNORMAL LOW (ref 12.0–15.0)
Immature Granulocytes: 0 %
Lymphocytes Relative: 39 %
Lymphs Abs: 4 10*3/uL (ref 0.7–4.0)
MCH: 29.2 pg (ref 26.0–34.0)
MCHC: 33.3 g/dL (ref 30.0–36.0)
MCV: 87.7 fL (ref 80.0–100.0)
Monocytes Absolute: 0.9 10*3/uL (ref 0.1–1.0)
Monocytes Relative: 9 %
Neutro Abs: 5.3 10*3/uL (ref 1.7–7.7)
Neutrophils Relative %: 51 %
Platelets: 247 10*3/uL (ref 150–400)
RBC: 3.59 MIL/uL — ABNORMAL LOW (ref 3.87–5.11)
RDW: 13.1 % (ref 11.5–15.5)
WBC: 10.4 10*3/uL (ref 4.0–10.5)
nRBC: 0 % (ref 0.0–0.2)

## 2021-01-31 LAB — BRAIN NATRIURETIC PEPTIDE: B Natriuretic Peptide: 204.5 pg/mL — ABNORMAL HIGH (ref 0.0–100.0)

## 2021-01-31 LAB — MAGNESIUM: Magnesium: 2.1 mg/dL (ref 1.7–2.4)

## 2021-01-31 MED ORDER — ATORVASTATIN CALCIUM 10 MG PO TABS
20.0000 mg | ORAL_TABLET | Freq: Every day | ORAL | Status: DC
  Administered 2021-02-01: 20 mg via ORAL
  Filled 2021-01-31: qty 2

## 2021-01-31 MED ORDER — POTASSIUM CHLORIDE CRYS ER 20 MEQ PO TBCR
40.0000 meq | EXTENDED_RELEASE_TABLET | Freq: Once | ORAL | Status: AC
  Administered 2021-01-31: 40 meq via ORAL
  Filled 2021-01-31: qty 2

## 2021-01-31 NOTE — TOC Initial Note (Signed)
Transition of Care [facility]) - Initial/Assessment Note    Patient Details  Name: Tracy [name] MRN: [id number] Date of Birth: 10/14/1920  [facility]) CM/SW Contact:    Pollie Friar, RN Phone Number: 01/31/2021, 1:07 PM  Clinical Narrative:                 Patient is from home alone but family checks in on her frequently. Recommendations are for [facility] therapies. CM spoke to patients niece as pt is confused. She was provided choice with medicare.gov and asked to use [facility]. Lattie Haw with [facility] has accepted the referral.  Pt needs youth walker for home. [employer] to deliver the DME to the room. Niece states she is responsible for patients medications at home and there have been no issues.  Family provides transportation. TOC following.  Expected Discharge Plan: [place] Barriers to Discharge: Continued Medical Work up   Patient Goals and CMS Choice   CMS Medicare.gov Compare Post Acute Care list provided to:: Patient Represenative (must comment) Choice offered to / list presented to :  (niece)  Expected Discharge Plan and Services Expected Discharge Plan: [place]   Discharge Planning Services: CM Consult Post Acute Care Choice: Home Health, Durable Medical Equipment Living arrangements for the past 2 months: Single Family Home                 DME Arranged: Walker youth DME Agency: [employer] Date DME Agency Contacted: 01/31/21   Representative spoke with at DME Agency: Jamal Maes HH Arranged: PT, OT [place] Agency: Guadalupe Guerra Date [place]: 01/31/21   Representative spoke with at [place]: Lattie Haw  Prior Living Arrangements/Services Living arrangements for the past 2 months: Single Family Home Lives with:: Self Patient language and need for interpreter reviewed:: Yes        Need for Family Participation in Patient Care: Yes (Comment) Care giver support system in place?: Yes (comment)   Criminal  Activity/Legal Involvement Pertinent to Current Situation/Hospitalization: No - Comment as needed  Activities of Daily Living Home Assistive Devices/Equipment: None ADL Screening (condition at time of admission) Patient's cognitive ability adequate to safely complete daily activities?: Yes Is the patient deaf or have difficulty hearing?: No Does the patient have difficulty seeing, even when wearing glasses/contacts?: No Does the patient have difficulty concentrating, remembering, or making decisions?: No Patient able to express need for assistance with ADLs?: Yes Does the patient have difficulty dressing or bathing?: No Independently performs ADLs?: Yes (appropriate for developmental age) Does the patient have difficulty walking or climbing stairs?: No Weakness of Legs: None Weakness of Arms/Hands: None  Permission Sought/Granted                  Emotional Assessment Appearance:: Appears stated age     Orientation: : Oriented to Self   Psych Involvement: No (comment)  Admission diagnosis:  Acute CVA (cerebrovascular accident) ([place]) [I63.9] Acute stroke due to occlusion of left posterior cerebral artery [facility]) [I63.532] Patient Active Problem List   Diagnosis Date Noted   Acute stroke due to occlusion of left posterior cerebral artery ([place]) 01/30/2021   Essential hypertension 01/30/2021   Anemia due to stage 4 chronic kidney disease ([place]) 01/30/2021   Chronic kidney disease, stage 4 (severe) ([place]) 01/30/2021   Mixed hyperlipidemia 01/30/2021   PCP:  Jilda Panda, MD Pharmacy:   [id number] - [place], [place] - [address]  [place] [address] Phone: [phone] Fax: [phone]     Social Determinants of Health (SDOH) Interventions    Readmission Risk Interventions No flowsheet data found.

## 2021-01-31 NOTE — Progress Notes (Addendum)
STROKE TEAM PROGRESS NOTE   ATTENDING NOTE: I reviewed above note and agree with the assessment and plan. Pt was seen and examined.   87 year old female with history of CKD, hypertension, hyperlipidemia admitted for left visual disturbance, generalized weakness and a fall.  CT showed right PCA infarct.  MRI confirmed right PCA infarct, as well as right frontal chronic infarct.  MRA head and neck unremarkable.  EF 65 to 70%.  A1c 6.0, LDL 79.  Creatinine 2.19-> 2.00.  WBC 12.1.  On exam, patient reclining in chair, sitter at bedside.  However, patient awake alert, orientated to age and place, not oriented due to time.  No aphasia, fluent language, able to name and repeat, follow some commands.  No gaze palsy, however left hemianopia.  No facial droop lateral upper and lower extremity equal strengths, sensation symmetrical, finger-nose grossly intact bilaterally.  Etiology for patient stroke likely embolic, concerning for cardioembolic source especially occult A. fib.  Recommend 30-day cardiac event monitoring as outpatient to rule out A. fib.  Continue aspirin 81 and Plavix 75 DAPT for 3 weeks and then aspirin alone.  Continue Lipitor 20.  PT/OT recommend home health PT/OT.  For detailed assessment and plan, please refer to above as I have made changes wherever appropriate.   Neurology will sign off. Please call with questions. Pt will follow up with stroke clinic NP at [facility] in about 4 weeks. Thanks for the consult.   Rosalin Hawking, MD PhD Stroke Neurology 01/31/2021 8:16 PM    INTERVAL HISTORY Patient is resting in bed with no family at the bedside.  She is hemodynamically stable and in no acute distress.  She reports that she had a good night and would like to be discharged soon.    Vitals:   01/30/21 2042 01/31/21 0009 01/31/21 0405 01/31/21 0801  BP: (!) 154/93 (!) 157/80 (!) 157/73 (!) 177/71  Pulse: 85   92  Resp:  (!) 24 (!) 21 18  Temp: 98.2 F (36.8 C) 98.1 F (36.7 C) 98 F  (36.7 C) 98.4 F (36.9 C)  TempSrc: Oral Oral Oral Oral  SpO2: 100% 100% 100% 100%  Weight:      Height:       CBC:  Recent Labs  Lab 01/30/21 0317 01/31/21 0257  WBC 12.1* 10.4  NEUTROABS 7.1 5.3  HGB 10.5* 10.5*  HCT 33.2* 31.5*  MCV 89.7 87.7  PLT 260 244    Basic Metabolic Panel:  Recent Labs  Lab 01/30/21 0317 01/31/21 0257  NA 137 138  K 3.5 3.3*  CL 103 105  CO2 24 27  GLUCOSE 104* 106*  BUN 33* 23  CREATININE 2.19* 2.00*  CALCIUM 8.9 8.8*  MG 2.1 2.1    Lipid Panel:  Recent Labs  Lab 01/30/21 0317  CHOL 171  TRIG 71  HDL 78  CHOLHDL 2.2  VLDL 14  LDLCALC 79    HgbA1c:  Recent Labs  Lab 01/30/21 0317  HGBA1C 6.0*    Urine Drug Screen: No results for input(s): LABOPIA, COCAINSCRNUR, LABBENZ, AMPHETMU, THCU, LABBARB in the last 168 hours.  Alcohol Level No results for input(s): ETH in the last 168 hours.  IMAGING past 24 hours ECHOCARDIOGRAM COMPLETE BUBBLE STUDY  Result Date: 01/30/2021    ECHOCARDIOGRAM REPORT   Patient Name:   Tracy [name] Date of Exam: 01/30/2021 Medical Rec #:  [id number]            Height:       57.0  in Accession #:    [id number]           Weight:       97.0 lb Date of Birth:  10/14/1920             BSA:          1.322 m Patient Age:    89 years             BP:           152/66 mmHg Patient Gender: F                    HR:           84 bpm. Exam Location:  Inpatient Procedure: 2D Echo, Cardiac Doppler, Color Doppler and Saline Contrast Bubble            Study Indications:   I10 Hypertension; Stroke  History:       Patient has no prior history of Echocardiogram examinations.                Stroke.  Sonographer:   Glo Herring Referring      [id number] Vernelle Emerald Phys: IMPRESSIONS  1. Left ventricular ejection fraction, by estimation, is 65 to 70%. The left ventricle has normal function. The left ventricle has no regional wall motion abnormalities. There is mild left ventricular hypertrophy. Left ventricular  diastolic parameters are consistent with Grade I diastolic dysfunction (impaired relaxation).  2. Right ventricular systolic function is normal. The right ventricular size is mildly enlarged. Tricuspid regurgitation signal is inadequate for assessing PA pressure.  3. The mitral valve is normal in structure. Trivial mitral valve regurgitation. No evidence of mitral stenosis.  4. The aortic valve is tricuspid. Aortic valve regurgitation is mild. No aortic stenosis is present.  5. The inferior vena cava is normal in size with greater than 50% respiratory variability, suggesting right atrial pressure of 3 mmHg.  6. Negative bubble study, no evidence for PFO or ASD. FINDINGS  Left Ventricle: Left ventricular ejection fraction, by estimation, is 65 to 70%. The left ventricle has normal function. The left ventricle has no regional wall motion abnormalities. The left ventricular internal cavity size was normal in size. There is  mild left ventricular hypertrophy. Left ventricular diastolic parameters are consistent with Grade I diastolic dysfunction (impaired relaxation). Right Ventricle: The right ventricular size is mildly enlarged. No increase in right ventricular wall thickness. Right ventricular systolic function is normal. Tricuspid regurgitation signal is inadequate for assessing PA pressure. Left Atrium: Left atrial size was normal in size. Right Atrium: Right atrial size was normal in size. Pericardium: There is no evidence of pericardial effusion. Mitral Valve: The mitral valve is normal in structure. Trivial mitral valve regurgitation. No evidence of mitral valve stenosis. Tricuspid Valve: The tricuspid valve is normal in structure. Tricuspid valve regurgitation is not demonstrated. Aortic Valve: The aortic valve is tricuspid. Aortic valve regurgitation is mild. No aortic stenosis is present. Aortic valve mean gradient measures 3.0 mmHg. Aortic valve peak gradient measures 5.3 mmHg. Pulmonic Valve: The pulmonic  valve was normal in structure. Pulmonic valve regurgitation is trivial. Aorta: The aortic root is normal in size and structure. Venous: The inferior vena cava is normal in size with greater than 50% respiratory variability, suggesting right atrial pressure of 3 mmHg. IAS/Shunts: Negative bubble study, no evidence for PFO or ASD. Agitated saline contrast was given intravenously to evaluate for intracardiac shunting.  LEFT VENTRICLE PLAX 2D LVIDd:  3.30 cm Diastology LVIDs:         2.20 cm LV e' medial:    4.67 cm/s LV PW:         1.30 cm LV E/e' medial:  11.5 LV IVS:        1.30 cm LV e' lateral:   6.80 cm/s                        LV E/e' lateral: 7.9  RIGHT VENTRICLE             IVC RV S prime:     10.50 cm/s  IVC diam: 1.30 cm LEFT ATRIUM         Index LA diam:    3.10 cm 2.34 cm/m  AORTIC VALVE                   PULMONIC VALVE AV Vmax:           115.00 cm/s PV Vmax:       0.85 m/s AV Vmean:          84.000 cm/s PV Peak grad:  2.9 mmHg AV VTI:            0.251 m AV Peak Grad:      5.3 mmHg AV Mean Grad:      3.0 mmHg LVOT Vmax:         87.50 cm/s LVOT Vmean:        58.800 cm/s LVOT VTI:          0.167 m LVOT/AV VTI ratio: 0.67  AORTA Ao Root diam: 2.60 cm Ao Asc diam:  3.10 cm MITRAL VALVE MV Area (PHT): 4.80 cm    SHUNTS MV Decel Time: 158 msec    Systemic VTI: 0.17 m MV E velocity: 53.60 cm/s MV A velocity: 84.80 cm/s MV E/A ratio:  0.63 Dalton McleanMD Electronically signed by Franki Monte Signature Date/Time: 01/30/2021/4:18:43 PM    Final     PHYSICAL EXAM General:  Patient is an alert, well-nourished female in no acute distress.   NEURO:   NEURO:  Mental Status: AA&Ox3  Speech/Language: speech is without dysarthria or aphasia.  Fluency, and comprehension intact.  Cranial Nerves:  II: PERRL. Visual field deficit noted in left upper and mid field.  Difficult to evaluate fully due to patient's tendency to look to left with right eye. III, IV, VI: EOMI. Eyelids elevate symmetrically.   V: Sensation is intact to light touch and symmetrical to face.  VII: Smile is symmetrical. Able to raise eyebrows.  VIII: hearing intact to voice. IX, X: Phonation is normal.  OA:[id number] shrug 5/5. XII: tongue is midline without fasciculations. Motor: 5/5 strength to all muscle groups tested.  Tone: is normal and bulk is normal Sensation- Intact to light touch bilaterally. No extinction.  Coordination: No drift.   Gait- deferred      ASSESSMENT/PLAN Tracy [name] is a 87 y.o. female with history of CKD, HTN and HLD presenting with difficulty arising from chair, frequent falls and left sided visual deficits.  She is unaware of these deficits.  TNK was not given in ED as patient's LKW was unknown. MRI reveals acute right occipital infarct with mild petechial hemorrhage, small subacute periatrial white matter infarct and remote right frontal infarct.  No flow limiting stenosis seen in intracranial vessels. 3.70mm left supraclinoid aneurysm seen on MRA.  Given patient's age, will not perform intervention at this time.  Will  continue DAPT and home dose of statin.  Stroke team will sign off at this time but will remain available if questions or concerns arise.  Stroke:  right occipital infarct, embolic pattern, source unclear CT head Acute right PCA distribution infarct, no hemorrhage or midline shift.  Small vessel disease. Atrophy.  MRI  Acute right occipital infarct with mild petechial hemorrhage. Small subacute left periatrial white matter infarct. Remote right frontal infarct. MRA No emergent finding or flow limiting stenosis of major vessels.  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  2D Echo EF 54-49%, grade 1 diastolic dysfunction, no evidence for PFO. Recommend 30-day cardiac event monitoring as outpatient to rule out A. fib LDL 79 HgbA1c 6.0 VTE prophylaxis - SQ heparin    Diet   Diet Heart Room service appropriate? Yes; Fluid consistency: Thin   No antithrombotic  prior to admission, now on aspirin 81 mg daily and clopidogrel 75 mg daily DAPT for 3 weeks and then aspirin alone. Therapy recommendations:  home PT/OT Disposition:  pending  Hypertension Home meds:  losartan 50 mg daily, lasix 40 mg daily Stable Permissive hypertension (OK if < 220/120) but gradually normalize in 5-7 days Long-term BP goal normotensive  Hyperlipidemia Home meds: none LDL 79, goal < 70 On atorvastatin to 20 mg daily High intensity statin deferred due to advanced age Continue statin at discharge   Other Stroke Risk Factors Advanced Age >/= 76    Other Active Problems CKD Monitor BMP, Cr currently 2.19 Avoid IV contrast when possible Renally dose medications as appropriate  Hospital day # [place], NP    To contact Stroke Continuity provider, please refer to [email or web address] After hours, contact General Neurology

## 2021-01-31 NOTE — Progress Notes (Signed)
PROGRESS NOTE                                                                                                                                                                                                             Patient Demographics:    Tracy [name], is a 87 y.o. female, DOB - 10/14/1920, HQR:[id number]  Outpatient Primary MD for the patient is Jilda Panda, MD    LOS - 1  Admit date - 01/29/2021    Chief Complaint  Patient presents with   Fall       Brief Narrative (HPI from H&P)  - 86 year old female with past medical history of hyperlipidemia, hypertension, chronic kidney disease stage IV and anemia of chronic disease who presents to [facility] emergency department with her niece with sudden fall and weakness work-up suggestive of acute stroke in the right PCA distribution she was admitted for further care.   Subjective:   Patient in bed, appears comfortable, denies any headache, no fever, no chest pain or pressure, no shortness of breath , no abdominal pain. No new focal weakness.    Assessment  & Plan :     Acute left PCA territory stroke.  Neurology on board full stroke pathway being followed, placed on low-dose Lipitor along with dual antiplatelet therapy.  Echo noted no PFO preserved EF, family wants to take her with home health and PT on 02/01/2021, defer any further changes to stroke team.  Stable A1c, LDL minimally elevated above 70 placed on low-dose statin to her age.  2.  Essential hypertension.  Permissive hypertension for now due to acute stroke.  3.  CKD stage IV.  Creatinine at baseline.  4.  Anemia of chronic disease.  No acute issues.  5. Dyslipidemia.  LDL borderline high due to her age will place her on low-dose statin.  7.  Mild metabolic encephalopathy at night.  Likely sundowning/delirium.  Resolved.  As needed Haldol and sitter as needed.        Condition - Extremely  Guarded  Family Communication  :  Niece [name] [phone] 01/30/21  Code Status :  DNR  Consults  :  Neuro  PUD Prophylaxis : PPI   Procedures  :     TTE -  1. Left ventricular ejection fraction, by estimation, is 65 to 70%. The left ventricle  has normal function. The left ventricle has no regional wall motion abnormalities. There is mild left ventricular hypertrophy. Left ventricular diastolic parameters  are consistent with Grade I diastolic dysfunction (impaired relaxation).   2. Right ventricular systolic function is normal. The right ventricular size is mildly enlarged. Tricuspid regurgitation signal is inadequate for assessing PA pressure.   3. The mitral valve is normal in structure. Trivial mitral valve regurgitation. No evidence of mitral stenosis.   4. The aortic valve is tricuspid. Aortic valve regurgitation is mild. No aortic stenosis is present.   5. The inferior vena cava is normal in size with greater than 50% respiratory variability, suggesting right atrial pressure of 3 mmHg.   6. Negative bubble study, no evidence for PFO or ASD.   MRI/A -  1. Acute right occipital infarct with mild petechial hemorrhage. 2. Small subacute left periatrial white matter infarct. 3. Remote right frontal infarct. Intracranial MRA: 1. No emergent finding or flow limiting stenosis of major vessels. 2. 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 Neck MRA: Negative.      Disposition Plan  :    Status is: Inpatient  Remains inpatient appropriate because: CVA   DVT Prophylaxis  :    heparin injection 5,000 Units Start: 01/30/21 1400    Lab Results  Component Value Date   PLT 247 01/31/2021    Diet :  Diet Order             Diet Heart Room service appropriate? Yes; Fluid consistency: Thin  Diet effective now                    Inpatient Medications  Scheduled Meds:   stroke: mapping our early stages of recovery book   Does not apply Once   aspirin EC  81 mg Oral Daily    [START ON 02/01/2021] atorvastatin  20 mg Oral Daily   clopidogrel  75 mg Oral Daily   heparin  5,000 Units Subcutaneous Q8H   pantoprazole  40 mg Oral Daily   Continuous Infusions: PRN Meds:.acetaminophen **OR** acetaminophen, haloperidol lactate, hydrALAZINE, ondansetron **OR** ondansetron (ZOFRAN) IV, polyethylene glycol  Antibiotics  :    Anti-infectives (From admission, onward)    None        Time Spent in minutes  30   Lala Lund M.D on 01/31/2021 at 11:44 AM  To page go to [email or web address]   [facility] -  Office  [phone]  See all Orders from today for further details    Objective:   Vitals:   01/31/21 0009 01/31/21 0405 01/31/21 0801 01/31/21 1108  BP: (!) 157/80 (!) 157/73 (!) 177/71 (!) 142/74  Pulse:   92 89  Resp: (!) 24 (!) 21 18 17   Temp: 98.1 F (36.7 C) 98 F (36.7 C) 98.4 F (36.9 C) 98.5 F (36.9 C)  TempSrc: Oral Oral Oral Oral  SpO2: 100% 100% 100% 100%  Weight:      Height:        Wt Readings from Last 3 Encounters:  01/29/21 44 kg     Intake/Output Summary (Last 24 hours) at 01/31/2021 1144 Last data filed at 01/31/2021 1120 Gross per 24 hour  Intake 400 ml  Output --  Net 400 ml     Physical Exam  Awake Alert x 2,  No new F.N deficits, Normal affect [place].AT,PERRAL Supple Neck, No JVD,   Symmetrical Chest wall movement, Good air movement bilaterally, CTAB RRR,No Gallops, Rubs or new Murmurs,  +ve  B.Sounds, Abd Soft, No tenderness,   No Cyanosis, Clubbing or edema,      Data Review:    CBC Recent Labs  Lab 01/29/21 2230 01/30/21 0317 01/31/21 0257  WBC 11.2* 12.1* 10.4  HGB 10.5* 10.5* 10.5*  HCT 32.0* 33.2* 31.5*  PLT 266 260 247  MCV 89.6 89.7 87.7  MCH 29.4 28.4 29.2  MCHC 32.8 31.6 33.3  RDW 13.1 13.2 13.1  LYMPHSABS 1.7 3.7 4.0  MONOABS 0.7 1.2* 0.9  EOSABS 0.0 0.1 0.2  BASOSABS 0.0 0.0 0.0    Recent Labs  Lab 01/29/21 2230 01/30/21 0317 01/31/21 0257  NA 136 137 138  K 3.8 3.5  3.3*  CL 102 103 105  CO2 22 24 27   GLUCOSE 118* 104* 106*  BUN 37* 33* 23  CREATININE 2.38* 2.19* 2.00*  CALCIUM 8.8* 8.9 8.8*  AST 25 28 29   ALT 7 12 13   ALKPHOS 74 72 57  BILITOT 0.8 0.6 0.4  ALBUMIN 3.5 3.4* 2.9*  MG  --  2.1 2.1  HGBA1C  --  6.0*  --   BNP  --   --  204.5*    ------------------------------------------------------------------------------------------------------------------ Recent Labs    01/30/21 0317  CHOL 171  HDL 78  LDLCALC 79  TRIG 71  CHOLHDL 2.2    Lab Results  Component Value Date   HGBA1C 6.0 (H) 01/30/2021   ------------------------------------------------------------------------------------------------------------------ No results for input(s): TSH, T4TOTAL, T3FREE, THYROIDAB in the last 72 hours.  Invalid input(s): FREET3  Cardiac Enzymes No results for input(s): CKMB, TROPONINI, MYOGLOBIN in the last 168 hours.  Invalid input(s): CK ------------------------------------------------------------------------------------------------------------------    Component Value Date/Time   BNP 204.5 (H) 01/31/2021 0257     Radiology Reports CT Head Wo Contrast  Result Date: 01/29/2021 CLINICAL DATA:  Head trauma. EXAM: CT HEAD WITHOUT CONTRAST TECHNIQUE: Contiguous axial images were obtained from the base of the skull through the vertex without intravenous contrast. COMPARISON:  CT head report only from 03/10/2001. FINDINGS: Brain: There is hypodensity with loss of gray-white matter distinction in the right occipital region. Gray-white matter distinction is otherwise preserved. No evidence of hemorrhage, hydrocephalus, extra-axial collection or mass lesion/mass effect. There is mild diffuse atrophy. There is a small old infarct in the right frontal lobe white matter. Prominent bilateral basal ganglia calcifications are seen. There is mild periventricular white matter hypodensity, likely chronic small vessel ischemic change. Vascular: No  hyperdense vessel or unexpected calcification. Skull: Normal. Negative for fracture or focal lesion. Sinuses/Orbits: No acute finding. Other: None. IMPRESSION: 1. 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 No hemorrhage or midline shift. 2. Mild diffuse atrophy and mild chronic small vessel ischemic change. 3. These results were called by telephone at the time of interpretation on 01/29/2021 at 11:05 pm to provider Dr. Pearline Cables, who verbally acknowledged these results. Electronically Signed   By: [name] M.D.   On: 01/29/2021 23:06   MR ANGIO HEAD WO CONTRAST  Result Date: 01/30/2021 CLINICAL DATA:  Acute infarct by head CT. EXAM: MRI HEAD WITHOUT CONTRAST MRA HEAD WITHOUT CONTRAST MRA NECK WITHOUT CONTRAST TECHNIQUE: Multiplanar, multiecho pulse sequences of the brain and surrounding structures were obtained without intravenous contrast. Angiographic images of the Circle of Willis were obtained using MRA technique without intravenous contrast. Angiographic images of the neck were obtained using MRA technique without intravenous contrast. Carotid stenosis measurements (when applicable) are obtained utilizing NASCET criteria, using the distal internal carotid diameter as the denominator. COMPARISON:  None. FINDINGS: MRI  HEAD FINDINGS Brain: Acute or early subacute cortical and subcortical infarction in the right occipital lobe. Subacute appearing left periatrial white matter infarct that is small. Moderate size remote superior right frontal infarct. Cerebral volume loss in keeping with age. Mild petechial hemorrhage at the right occipital infarction. No hydrocephalus, hematoma, or mass. Vascular: See below Skull and upper cervical spine: Normal marrow signal Sinuses/Orbits: Bilateral cataract resection MRA HEAD FINDINGS Atheromatous irregularity of anterior and posterior circulation vessels that is mild for age. No branch occlusion, beading, or flow limiting stenosis. Symmetric flow seen in  the posterior cerebral arteries. Mildly lobulated aneurysm extending inferiorly from the supraclinoid left ICA measuring 3.6 mm in length. MRA NECK FINDINGS Antegrade flow in the carotid and vertebral arteries. No flow limiting stenosis seen in the carotid or vertebral circulation. IMPRESSION: Brain MRI: 1. Acute right occipital infarct with mild petechial hemorrhage. 2. Small subacute left periatrial white matter infarct. 3. Remote right frontal infarct. Intracranial MRA: 1. No emergent finding or flow limiting stenosis of major vessels. 2. 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 Neck MRA: Negative. Electronically Signed   By: Jorje Guild M.D.   On: 01/30/2021 07:56   MR ANGIO NECK WO CONTRAST  Result Date: 01/30/2021 CLINICAL DATA:  Acute infarct by head CT. EXAM: MRI HEAD WITHOUT CONTRAST MRA HEAD WITHOUT CONTRAST MRA NECK WITHOUT CONTRAST TECHNIQUE: Multiplanar, multiecho pulse sequences of the brain and surrounding structures were obtained without intravenous contrast. Angiographic images of the Circle of Willis were obtained using MRA technique without intravenous contrast. Angiographic images of the neck were obtained using MRA technique without intravenous contrast. Carotid stenosis measurements (when applicable) are obtained utilizing NASCET criteria, using the distal internal carotid diameter as the denominator. COMPARISON:  None. FINDINGS: MRI HEAD FINDINGS Brain: Acute or early subacute cortical and subcortical infarction in the right occipital lobe. Subacute appearing left periatrial white matter infarct that is small. Moderate size remote superior right frontal infarct. Cerebral volume loss in keeping with age. Mild petechial hemorrhage at the right occipital infarction. No hydrocephalus, hematoma, or mass. Vascular: See below Skull and upper cervical spine: Normal marrow signal Sinuses/Orbits: Bilateral cataract resection MRA HEAD FINDINGS Atheromatous irregularity of anterior and posterior  circulation vessels that is mild for age. No branch occlusion, beading, or flow limiting stenosis. Symmetric flow seen in the posterior cerebral arteries. Mildly lobulated aneurysm extending inferiorly from the supraclinoid left ICA measuring 3.6 mm in length. MRA NECK FINDINGS Antegrade flow in the carotid and vertebral arteries. No flow limiting stenosis seen in the carotid or vertebral circulation. IMPRESSION: Brain MRI: 1. Acute right occipital infarct with mild petechial hemorrhage. 2. Small subacute left periatrial white matter infarct. 3. Remote right frontal infarct. Intracranial MRA: 1. No emergent finding or flow limiting stenosis of major vessels. 2. 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 Neck MRA: Negative. Electronically Signed   By: Jorje Guild M.D.   On: 01/30/2021 07:56   MR BRAIN WO CONTRAST  Result Date: 01/30/2021 CLINICAL DATA:  Acute infarct by head CT. EXAM: MRI HEAD WITHOUT CONTRAST MRA HEAD WITHOUT CONTRAST MRA NECK WITHOUT CONTRAST TECHNIQUE: Multiplanar, multiecho pulse sequences of the brain and surrounding structures were obtained without intravenous contrast. Angiographic images of the Circle of Willis were obtained using MRA technique without intravenous contrast. Angiographic images of the neck were obtained using MRA technique without intravenous contrast. Carotid stenosis measurements (when applicable) are obtained utilizing NASCET criteria, using the distal internal carotid diameter as the denominator. COMPARISON:  None. FINDINGS: MRI HEAD  FINDINGS Brain: Acute or early subacute cortical and subcortical infarction in the right occipital lobe. Subacute appearing left periatrial white matter infarct that is small. Moderate size remote superior right frontal infarct. Cerebral volume loss in keeping with age. Mild petechial hemorrhage at the right occipital infarction. No hydrocephalus, hematoma, or mass. Vascular: See below Skull and upper cervical spine: Normal marrow signal  Sinuses/Orbits: Bilateral cataract resection MRA HEAD FINDINGS Atheromatous irregularity of anterior and posterior circulation vessels that is mild for age. No branch occlusion, beading, or flow limiting stenosis. Symmetric flow seen in the posterior cerebral arteries. Mildly lobulated aneurysm extending inferiorly from the supraclinoid left ICA measuring 3.6 mm in length. MRA NECK FINDINGS Antegrade flow in the carotid and vertebral arteries. No flow limiting stenosis seen in the carotid or vertebral circulation. IMPRESSION: Brain MRI: 1. Acute right occipital infarct with mild petechial hemorrhage. 2. Small subacute left periatrial white matter infarct. 3. Remote right frontal infarct. Intracranial MRA: 1. No emergent finding or flow limiting stenosis of major vessels. 2. 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 Neck MRA: Negative. Electronically Signed   By: Jorje Guild M.D.   On: 01/30/2021 07:56   DG Hand Complete Right  Result Date: 01/30/2021 CLINICAL DATA:  Status post fall with pain to the third metacarpophalangeal joint. EXAM: RIGHT HAND - COMPLETE 3+ VIEW COMPARISON:  None. FINDINGS: There is no evidence of an acute fracture or dislocation. Degenerative changes seen along the carpometacarpal articulation of the right thumb. Soft tissues are unremarkable. IMPRESSION: No acute fracture or dislocation. Electronically Signed   By: Virgina Norfolk M.D.   On: 01/30/2021 01:28   ECHOCARDIOGRAM COMPLETE BUBBLE STUDY  Result Date: 01/30/2021    ECHOCARDIOGRAM REPORT   Patient Name:   Tracy [name] Date of Exam: 01/30/2021 Medical Rec #:  [id number]            Height:       57.0 in Accession #:    [id number]           Weight:       97.0 lb Date of Birth:  10/14/1920             BSA:          1.322 m Patient Age:    35 years             BP:           152/66 mmHg Patient Gender: F                    HR:           84 bpm. Exam Location:  Inpatient Procedure: 2D Echo, Cardiac Doppler, Color Doppler  and Saline Contrast Bubble            Study Indications:   I10 Hypertension; Stroke  History:       Patient has no prior history of Echocardiogram examinations.                Stroke.  Sonographer:   Glo Herring Referring      [id number] Vernelle Emerald Phys: IMPRESSIONS  1. Left ventricular ejection fraction, by estimation, is 65 to 70%. The left ventricle has normal function. The left ventricle has no regional wall motion abnormalities. There is mild left ventricular hypertrophy. Left ventricular diastolic parameters are consistent with Grade I diastolic dysfunction (impaired relaxation).  2. Right ventricular systolic function is normal. The right ventricular size is mildly enlarged. Tricuspid regurgitation signal  is inadequate for assessing PA pressure.  3. The mitral valve is normal in structure. Trivial mitral valve regurgitation. No evidence of mitral stenosis.  4. The aortic valve is tricuspid. Aortic valve regurgitation is mild. No aortic stenosis is present.  5. The inferior vena cava is normal in size with greater than 50% respiratory variability, suggesting right atrial pressure of 3 mmHg.  6. Negative bubble study, no evidence for PFO or ASD. FINDINGS  Left Ventricle: Left ventricular ejection fraction, by estimation, is 65 to 70%. The left ventricle has normal function. The left ventricle has no regional wall motion abnormalities. The left ventricular internal cavity size was normal in size. There is  mild left ventricular hypertrophy. Left ventricular diastolic parameters are consistent with Grade I diastolic dysfunction (impaired relaxation). Right Ventricle: The right ventricular size is mildly enlarged. No increase in right ventricular wall thickness. Right ventricular systolic function is normal. Tricuspid regurgitation signal is inadequate for assessing PA pressure. Left Atrium: Left atrial size was normal in size. Right Atrium: Right atrial size was normal in size. Pericardium: There is no  evidence of pericardial effusion. Mitral Valve: The mitral valve is normal in structure. Trivial mitral valve regurgitation. No evidence of mitral valve stenosis. Tricuspid Valve: The tricuspid valve is normal in structure. Tricuspid valve regurgitation is not demonstrated. Aortic Valve: The aortic valve is tricuspid. Aortic valve regurgitation is mild. No aortic stenosis is present. Aortic valve mean gradient measures 3.0 mmHg. Aortic valve peak gradient measures 5.3 mmHg. Pulmonic Valve: The pulmonic valve was normal in structure. Pulmonic valve regurgitation is trivial. Aorta: The aortic root is normal in size and structure. Venous: The inferior vena cava is normal in size with greater than 50% respiratory variability, suggesting right atrial pressure of 3 mmHg. IAS/Shunts: Negative bubble study, no evidence for PFO or ASD. Agitated saline contrast was given intravenously to evaluate for intracardiac shunting.  LEFT VENTRICLE PLAX 2D LVIDd:         3.30 cm Diastology LVIDs:         2.20 cm LV e' medial:    4.67 cm/s LV PW:         1.30 cm LV E/e' medial:  11.5 LV IVS:        1.30 cm LV e' lateral:   6.80 cm/s                        LV E/e' lateral: 7.9  RIGHT VENTRICLE             IVC RV S prime:     10.50 cm/s  IVC diam: 1.30 cm LEFT ATRIUM         Index LA diam:    3.10 cm 2.34 cm/m  AORTIC VALVE                   PULMONIC VALVE AV Vmax:           115.00 cm/s PV Vmax:       0.85 m/s AV Vmean:          84.000 cm/s PV Peak grad:  2.9 mmHg AV VTI:            0.251 m AV Peak Grad:      5.3 mmHg AV Mean Grad:      3.0 mmHg LVOT Vmax:         87.50 cm/s LVOT Vmean:        58.800 cm/s LVOT VTI:  0.167 m LVOT/AV VTI ratio: 0.67  AORTA Ao Root diam: 2.60 cm Ao Asc diam:  3.10 cm MITRAL VALVE MV Area (PHT): 4.80 cm    SHUNTS MV Decel Time: 158 msec    Systemic VTI: 0.17 m MV E velocity: 53.60 cm/s MV A velocity: 84.80 cm/s MV E/A ratio:  0.63 Dalton McleanMD Electronically signed by Franki Monte Signature  Date/Time: 01/30/2021/4:18:43 PM    Final

## 2021-01-31 NOTE — Evaluation (Signed)
Speech Language Pathology Evaluation Patient Details Name: Tracy [name] MRN: [id number] DOB: 10/14/1920 Today's Date: 01/31/2021 Time: 1205-1224 SLP Time Calculation (min) (ACUTE ONLY): 19 min  Problem List:  Patient Active Problem List   Diagnosis Date Noted   Acute stroke due to occlusion of left posterior cerebral artery ([place]) 01/30/2021   Essential hypertension 01/30/2021   Anemia due to stage 4 chronic kidney disease ([place]) 01/30/2021   Chronic kidney disease, stage 4 (severe) ([place]) 01/30/2021   Mixed hyperlipidemia 01/30/2021   Past Medical History:  Past Medical History:  Diagnosis Date   Anemia due to stage 4 chronic kidney disease ([place]) 01/30/2021   Chronic kidney disease, stage 4 (severe) ([place]) 01/30/2021   Essential hypertension 01/30/2021   Mixed hyperlipidemia 01/30/2021   Past Surgical History: History reviewed. No pertinent surgical history. HPI:  87 year old female with past medical history of hyperlipidemia, hypertension, chronic kidney disease stage IV and anemia of chronic disease who presents to [facility] emergency department with her niece with sudden fall and weakness work-up suggestive of acute stroke in the right PCA distribution she was admitted for further care   Assessment / Plan / Recommendation Clinical Impression  Pt presents with cognitive deficist post CVA, likely worsened from baseline though no family or caregiver present to confirm. Per chart review pt with sun downing since admission, requiring sitter at times. [facility] Mental Status (SLUMS) was administered (5/30) with global deficits in recall, mental manipulation, executive function skills, attention, and visual spatial skills (pt with known left visual cut from CVA). Pt reports at baseline living alone with supportive niece and nephew. Pt will need 24 hour supervision due to cognitive deficits. Motor speech skills and receptive and expressive language skills  appeared grossly intact. Will continue to follow during acute stay to assist with cognitive recovery.    SLP Assessment  SLP Recommendation/Assessment: Patient needs continued Speech [place] [facility] SLP Visit Diagnosis: Cognitive communication deficit (R41.841)    Recommendations for follow up therapy are one component of a multi-disciplinary discharge planning process, led by the attending physician.  Recommendations may be updated based on patient status, additional functional criteria and insurance authorization.    Follow Up Recommendations  Home health SLP    Frequency and Duration min 2x/week  2 weeks      SLP Evaluation Cognition  Overall Cognitive Status: Impaired/Different from baseline Arousal/Alertness: Awake/alert Orientation Level: Oriented to person;Disoriented to place;Disoriented to time (disoriented to day of the week, day of the month) Attention: Focused;Alternating Focused Attention: Impaired Focused Attention Impairment: Verbal complex;Functional complex Alternating Attention: Impaired Alternating Attention Impairment: Verbal complex;Functional complex Memory: Impaired Memory Impairment: Decreased recall of new information;Decreased short term memory Awareness: Impaired Problem Solving: Impaired Problem Solving Impairment: Verbal complex;Functional complex Executive Function: Organizing;Self Monitoring Organizing: Impaired Self Monitoring: Impaired Safety/Judgment: Impaired       Comprehension  Auditory Comprehension Overall Auditory Comprehension: Appears within functional limits for tasks assessed    Expression Verbal Expression Overall Verbal Expression: Appears within functional limits for tasks assessed Written Expression Dominant Hand: Right   Oral / Motor  Oral Motor/Sensory Function Overall Oral Motor/Sensory Function: Within functional limits Motor Speech Overall Motor Speech: Appears within functional limits for tasks assessed    GO                    Hayden Rasmussen MA, CCC-SLP Acute Rehabilitation Services   01/31/2021, 12:54 PM

## 2021-02-01 ENCOUNTER — Inpatient Hospital Stay (HOSPITAL_COMMUNITY): Payer: Medicare Other

## 2021-02-01 ENCOUNTER — Other Ambulatory Visit: Payer: Self-pay | Admitting: Physician Assistant

## 2021-02-01 DIAGNOSIS — I63532 Cerebral infarction due to unspecified occlusion or stenosis of left posterior cerebral artery: Secondary | ICD-10-CM | POA: Diagnosis not present

## 2021-02-01 DIAGNOSIS — N184 Chronic kidney disease, stage 4 (severe): Secondary | ICD-10-CM | POA: Diagnosis not present

## 2021-02-01 LAB — CBC WITH DIFFERENTIAL/PLATELET
Abs Immature Granulocytes: 0 10*3/uL (ref 0.00–0.07)
Basophils Absolute: 0 10*3/uL (ref 0.0–0.1)
Basophils Relative: 0 %
Eosinophils Absolute: 0.3 10*3/uL (ref 0.0–0.5)
Eosinophils Relative: 2 %
HCT: 32.7 % — ABNORMAL LOW (ref 36.0–46.0)
Hemoglobin: 10.9 g/dL — ABNORMAL LOW (ref 12.0–15.0)
Lymphocytes Relative: 41 %
Lymphs Abs: 5.5 10*3/uL — ABNORMAL HIGH (ref 0.7–4.0)
MCH: 29.2 pg (ref 26.0–34.0)
MCHC: 33.3 g/dL (ref 30.0–36.0)
MCV: 87.7 fL (ref 80.0–100.0)
Monocytes Absolute: 0.5 10*3/uL (ref 0.1–1.0)
Monocytes Relative: 4 %
Neutro Abs: 7 10*3/uL (ref 1.7–7.7)
Neutrophils Relative %: 53 %
Platelets: 275 10*3/uL (ref 150–400)
RBC: 3.73 MIL/uL — ABNORMAL LOW (ref 3.87–5.11)
RDW: 13.1 % (ref 11.5–15.5)
WBC: 13.3 10*3/uL — ABNORMAL HIGH (ref 4.0–10.5)
nRBC: 0 % (ref 0.0–0.2)
nRBC: 0 /100 WBC

## 2021-02-01 LAB — COMPREHENSIVE METABOLIC PANEL
ALT: 11 U/L (ref 0–44)
AST: 35 U/L (ref 15–41)
Albumin: 3.2 g/dL — ABNORMAL LOW (ref 3.5–5.0)
Alkaline Phosphatase: 68 U/L (ref 38–126)
Anion gap: 8 (ref 5–15)
BUN: 21 mg/dL (ref 8–23)
CO2: 24 mmol/L (ref 22–32)
Calcium: 8.8 mg/dL — ABNORMAL LOW (ref 8.9–10.3)
Chloride: 104 mmol/L (ref 98–111)
Creatinine, Ser: 1.92 mg/dL — ABNORMAL HIGH (ref 0.44–1.00)
GFR, Estimated: 23 mL/min — ABNORMAL LOW (ref >60)
Glucose, Bld: 128 mg/dL — ABNORMAL HIGH (ref 70–99)
Potassium: 3.8 mmol/L (ref 3.5–5.1)
Sodium: 136 mmol/L (ref 135–145)
Total Bilirubin: 0.7 mg/dL (ref 0.3–1.2)
Total Protein: 6.3 g/dL — ABNORMAL LOW (ref 6.5–8.1)

## 2021-02-01 LAB — MAGNESIUM: Magnesium: 2.1 mg/dL (ref 1.7–2.4)

## 2021-02-01 LAB — BRAIN NATRIURETIC PEPTIDE: B Natriuretic Peptide: 159 pg/mL — ABNORMAL HIGH (ref 0.0–100.0)

## 2021-02-01 IMAGING — CT CT HIP*R* W/O CM
2 of 5 series · 14 of 46 positions shown, 18 images · non-contrast
Comparison: None.

CLINICAL DATA: Limping, acute, hip pain.

EXAM:
CT OF THE RIGHT HIP WITHOUT CONTRAST
TECHNIQUE: Multidetector CT imaging of the right hip was performed according to
the standard protocol. Multiplanar CT image reconstructions were
also generated.

[Series 5: soft tissue · axial · 0.42mm/px · z∈[+659,+859]mm · 12 of 112 slices shown, 15 images]
[im 8/112  soft-tissue]
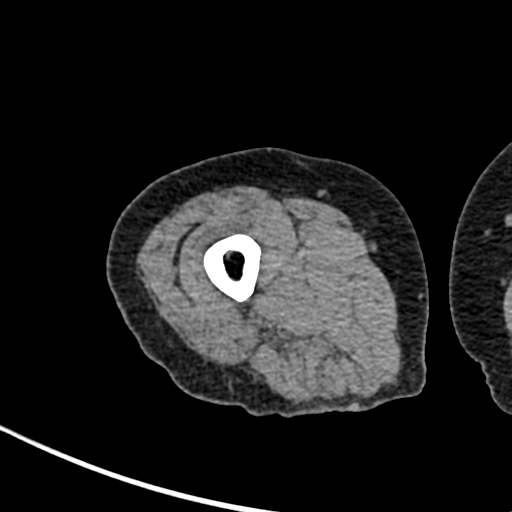
[im 8/112  bone]
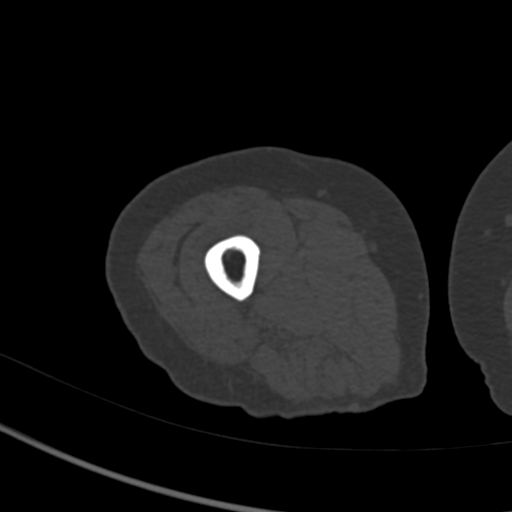
[im 22/112  soft-tissue]
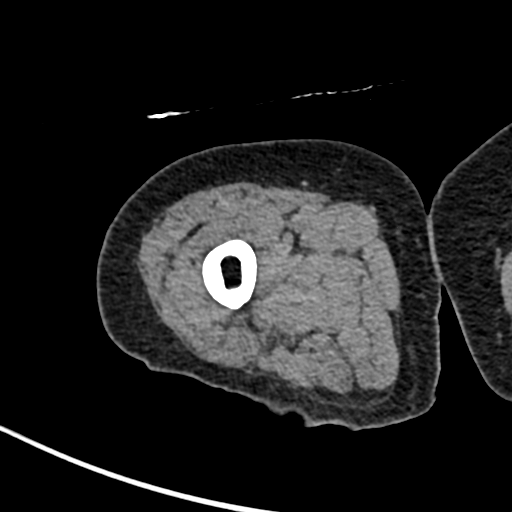
[im 33/112  soft-tissue]
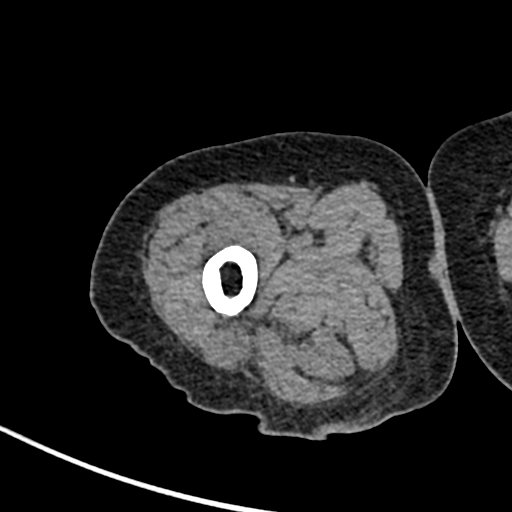
[im 43/112  soft-tissue]
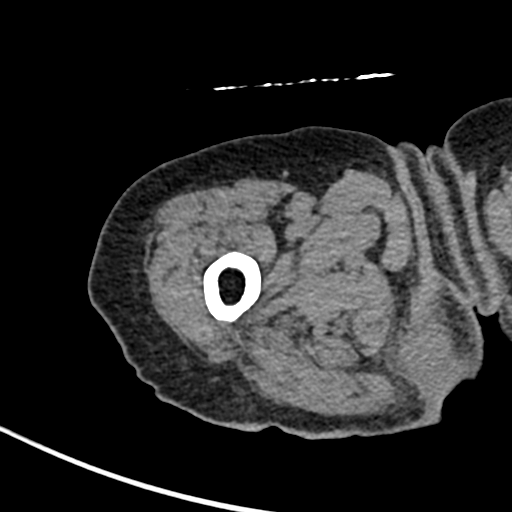
[im 58/112  soft-tissue]
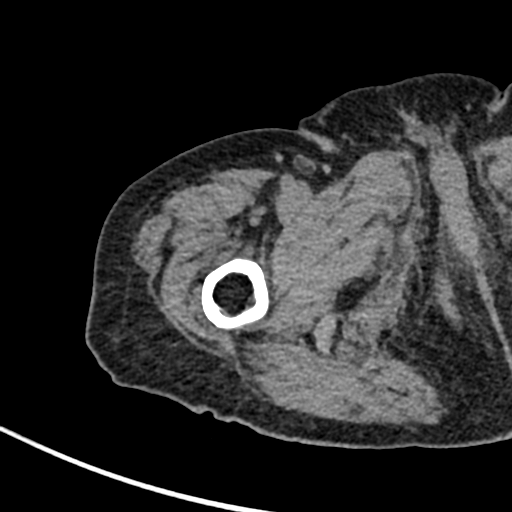
[im 69/112  soft-tissue]
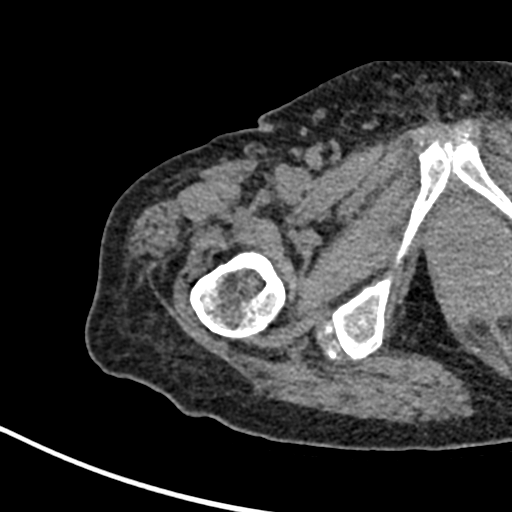
[im 79/112  soft-tissue]
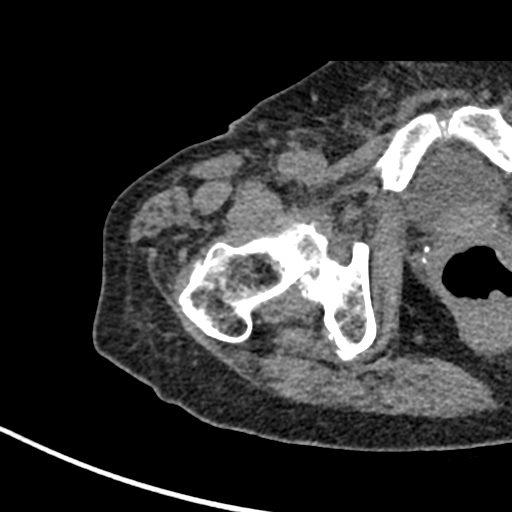
[im 94/112  soft-tissue]
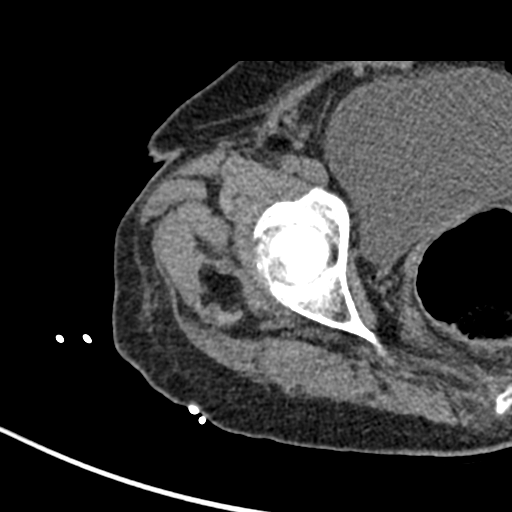
[im 97/112  lung]
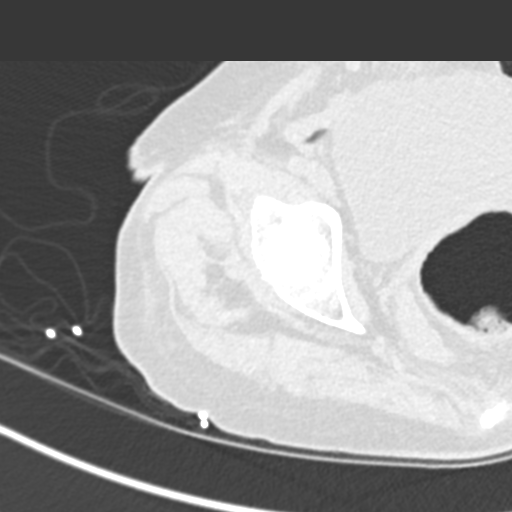
[im 101/112  lung]
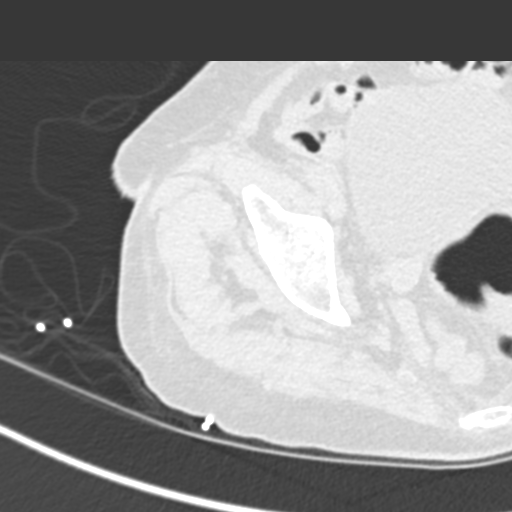
[im 104/112  soft-tissue]
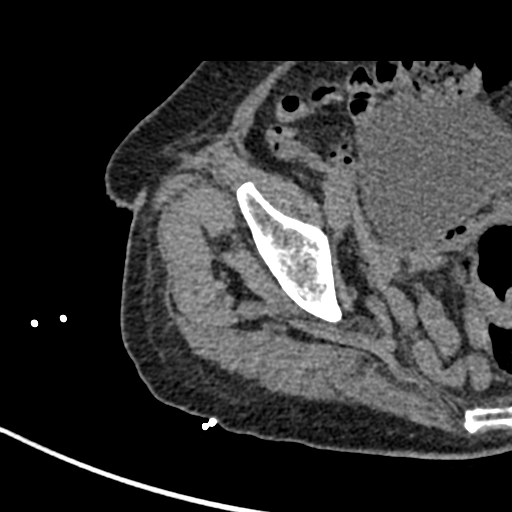
[im 104/112  lung]
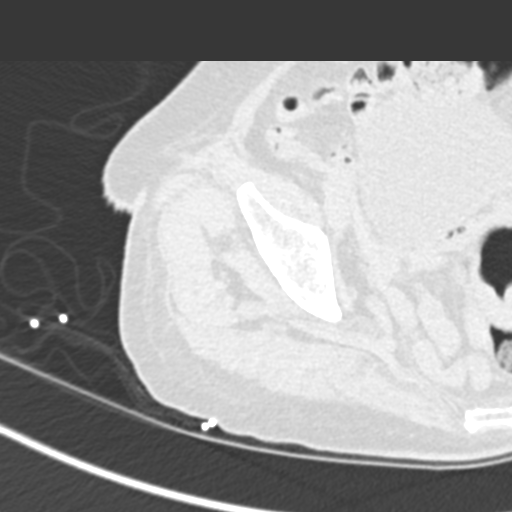
[im 104/112  bone]
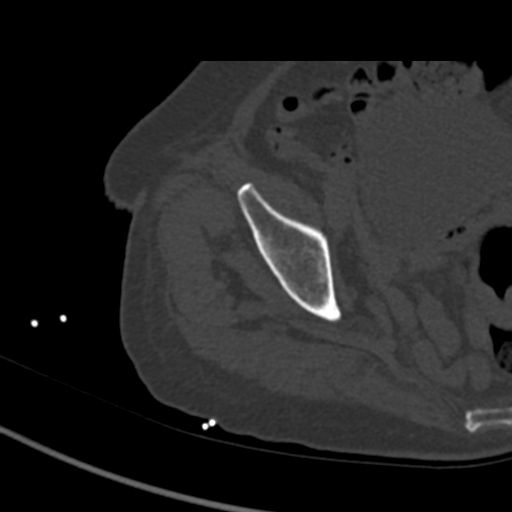
[im 108/112  lung]
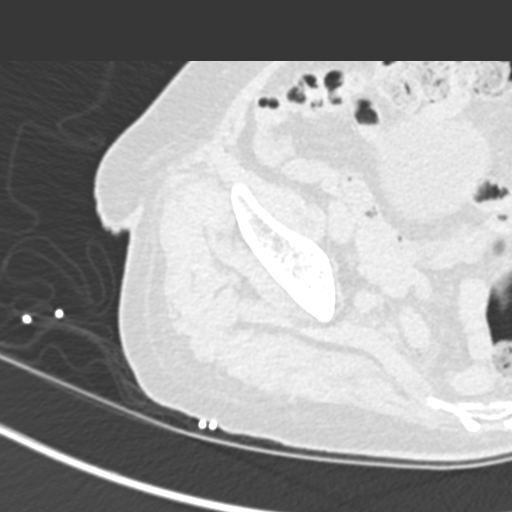

[Series 6: cor soft · coronal · 0.35mm/px · 2 of 88 slices shown, 3 images]
[im 30/88  soft-tissue]
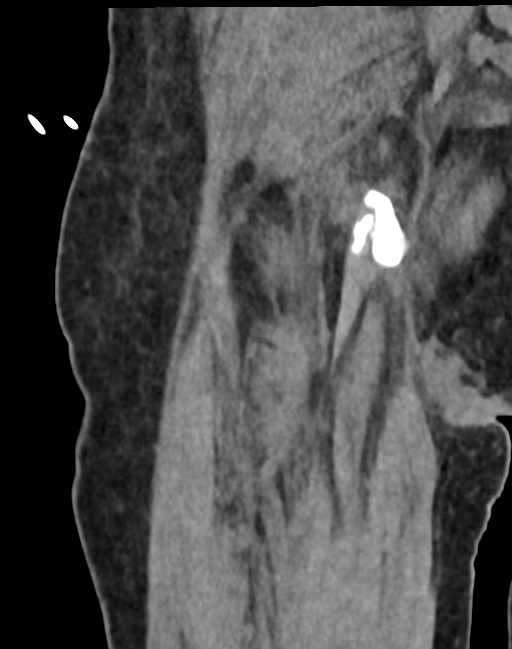
[im 30/88  bone]
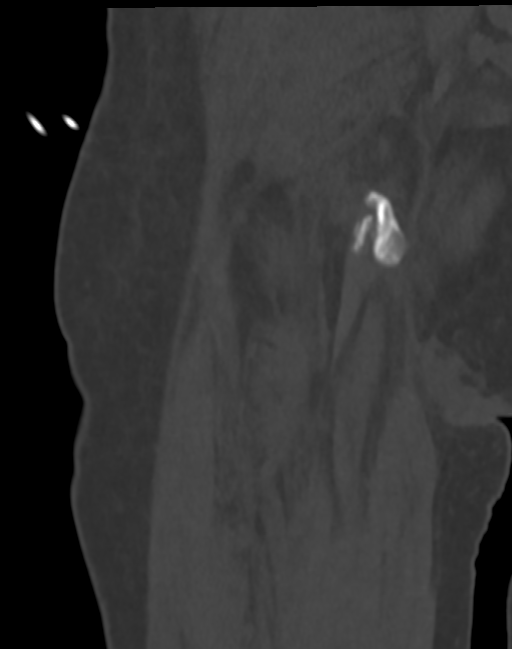
[im 59/88  soft-tissue]
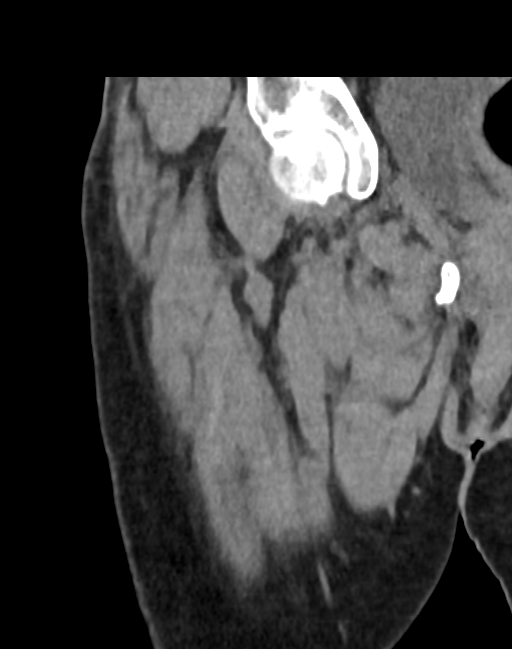

[14 of 46 positions shown; findings below may reference images not displayed]

FINDINGS: Bones/Joint/Cartilage

No evidence of acute fracture, dislocation or femoral head avascular
necrosis. No significant right hip joint effusion identified. There
are mild right hip degenerative changes. The visualized portions of
the inferior right hemipelvis appear unremarkable.

Ligaments

Suboptimally assessed by CT.

Muscles and Tendons

Prominent enthesophytes at the right common hamstring insertion on
the right ischium. No evidence of tendon tear or focal muscular
atrophy.

Soft tissues

No periarticular fluid collection, foreign body or inflammatory
change identified. The visualized internal pelvic contents appear
unremarkable.
IMPRESSION: 1. No evidence of acute right hip fracture or dislocation. If the
patient has persistent hip pain or inability to bear weight, follow
up imaging may be warranted as acute hip fractures can be
radiographically occult in the elderly (even by CT).
2. No significant joint effusion or periarticular fluid collection.

## 2021-02-01 IMAGING — CR DG KNEE COMPLETE 4+V*R*
5 series · 5 of 5 positions shown · non-contrast
Comparison: None.

CLINICAL DATA: Pain right knee

EXAM:
RIGHT KNEE - COMPLETE 4+ VIEW

[knee ap]
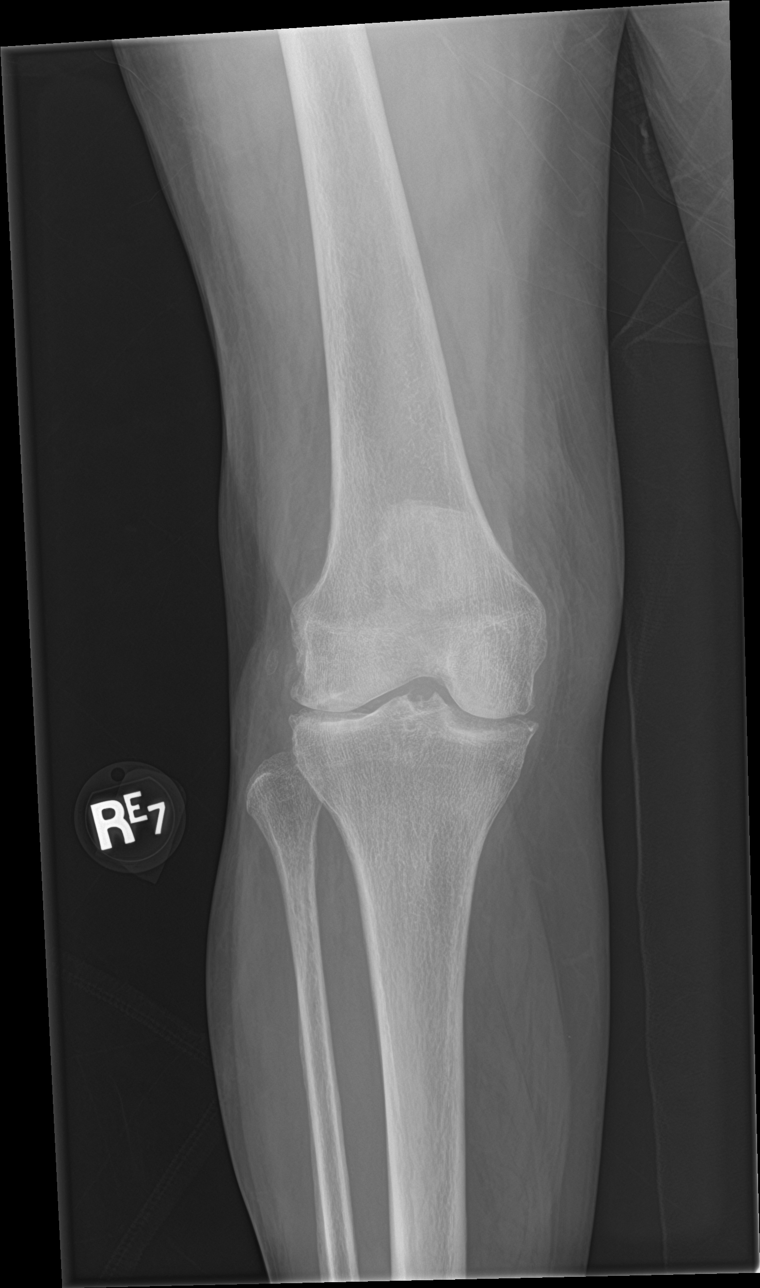

[knee obl (1 of 2)]
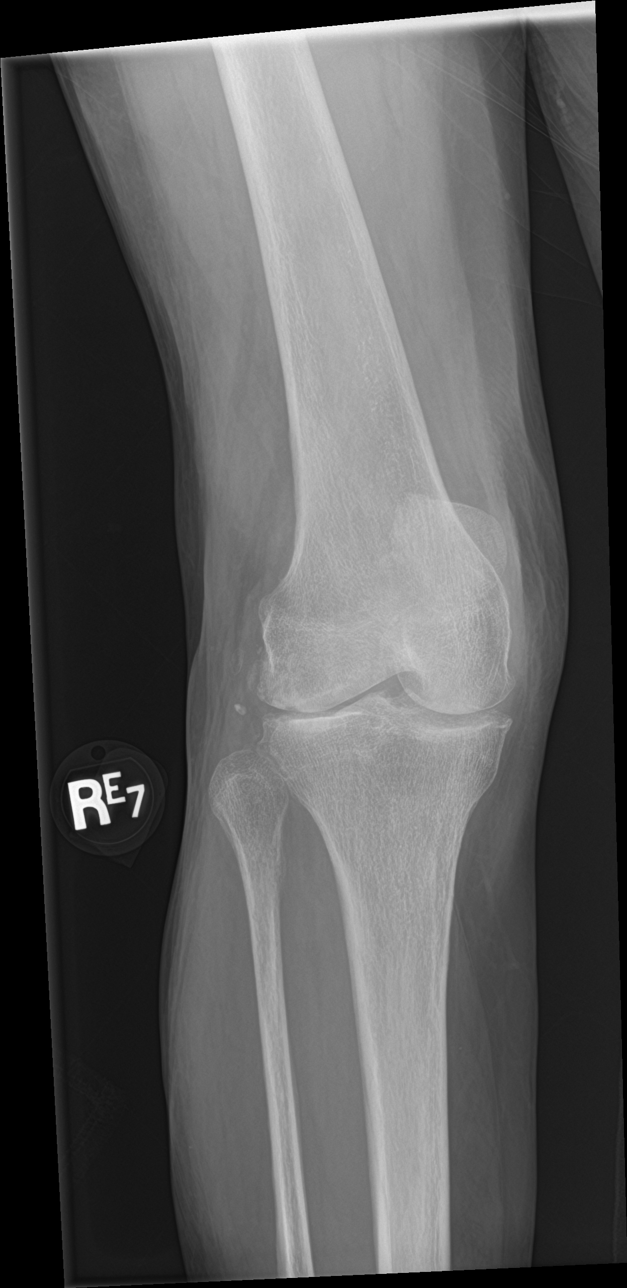

[knee obl (2 of 2)]
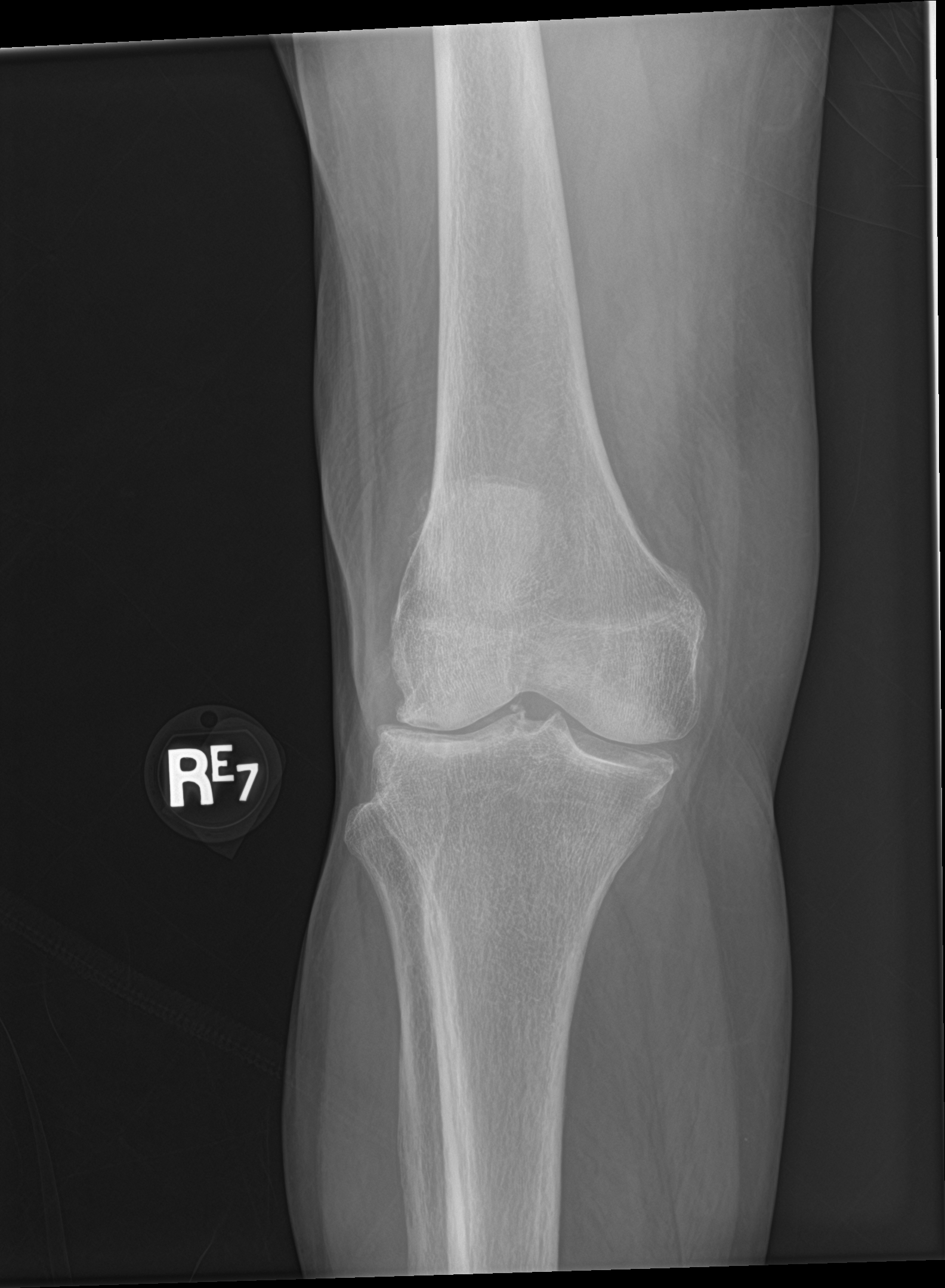

[knee lat]
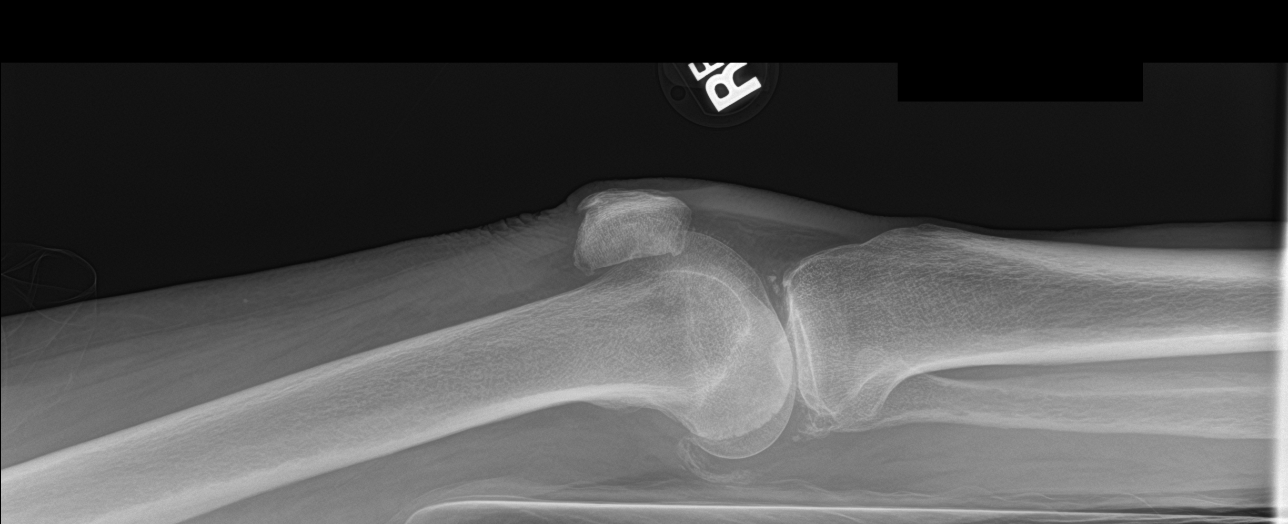

[knee sunrise]
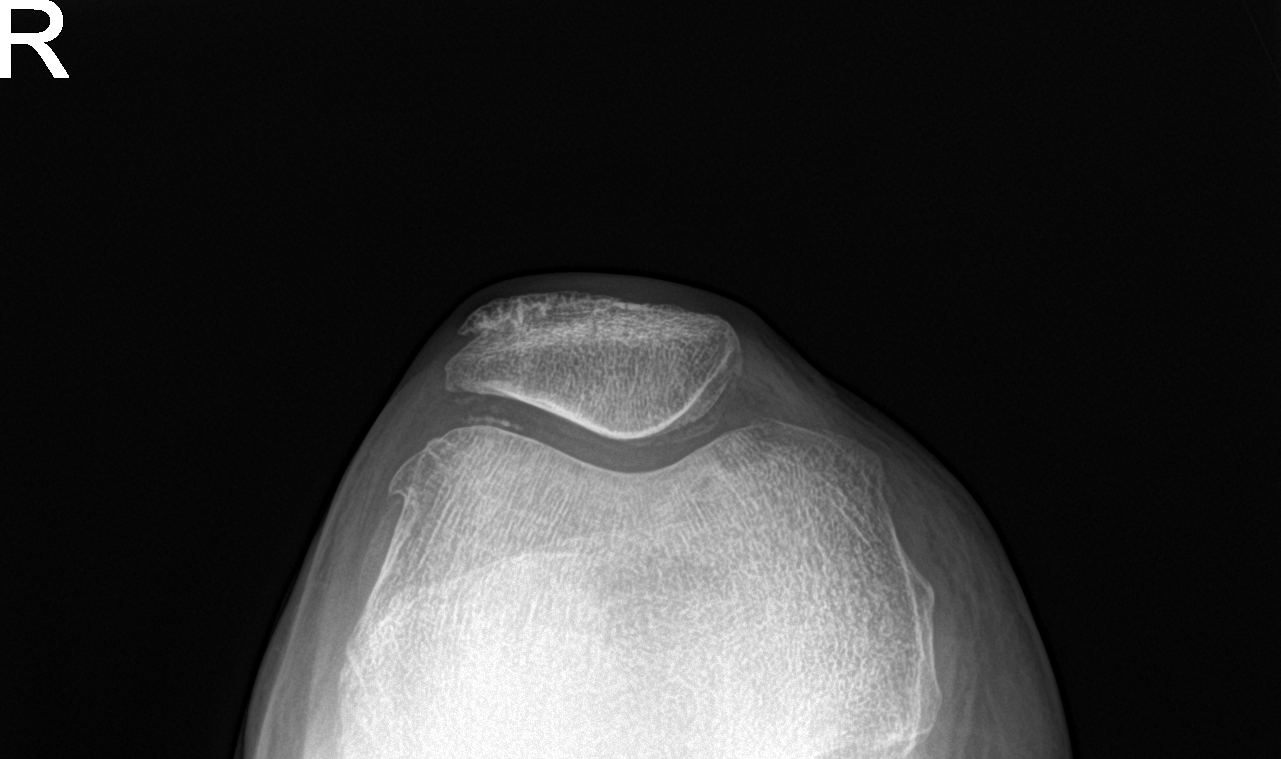

[5 of 5 positions shown; findings below may reference images not displayed]

FINDINGS: No recent fracture or dislocation is seen. Degenerative changes are
noted with bony spurs in medial, lateral and patellofemoral
compartments. There is possible chondrocalcinosis. There is no
significant effusion.
IMPRESSION: No recent fracture or dislocation is seen in right knee.
Degenerative changes are noted with bony spurs in medial, lateral
and patellofemoral compartments.

Reading location: [place], [place].

## 2021-02-01 MED ORDER — ASPIRIN 81 MG PO TBEC: 81.0000 mg | DELAYED_RELEASE_TABLET | Freq: Every day | ORAL | 11 refills | Status: DC

## 2021-02-01 MED ORDER — ATORVASTATIN CALCIUM 40 MG PO TABS: 40.0000 mg | ORAL_TABLET | Freq: Every day | ORAL | 0 refills | Status: DC

## 2021-02-01 MED ORDER — FUROSEMIDE 40 MG PO TABS: 40.0000 mg | ORAL_TABLET | Freq: Every day | ORAL | Status: DC

## 2021-02-01 MED ORDER — LOSARTAN POTASSIUM 50 MG PO TABS: 50.0000 mg | ORAL_TABLET | Freq: Every day | ORAL | Status: DC

## 2021-02-01 MED ORDER — PANTOPRAZOLE SODIUM 40 MG PO TBEC: 40.0000 mg | DELAYED_RELEASE_TABLET | Freq: Every day | ORAL | 0 refills | Status: DC

## 2021-02-01 MED ORDER — CLOPIDOGREL BISULFATE 75 MG PO TABS: 75.0000 mg | ORAL_TABLET | Freq: Every day | ORAL | 0 refills | Status: AC

## 2021-02-01 NOTE — Progress Notes (Signed)
Patient ready for discharge to home; discharge instructions given and reviewed; Rx's sent electronically and reviewed with patient and family.  Stroke education reviewed. Patient dressed with staff assistance and discharged out via wheelchair accompanied home by her family members.

## 2021-02-01 NOTE — TOC Transition Note (Signed)
Transition of Care [facility]) - CM/SW Discharge Note   Patient Details  Name: Tracy [name] MRN: [id number] Date of Birth: 10/14/1920  Transition of Care [facility]) CM/SW Contact:  Pollie Friar, RN Phone Number: 02/01/2021, 10:22 AM   Clinical Narrative:    Patient is discharging home today with home health services through [facility]. Information on the AVS. Walker for home delivered to the room per [employer]. Pt's niece to arrange supervision at home.  Pt has transport home.   Final next level of care: Home w Home Health Services Barriers to Discharge: No Barriers Identified   Patient Goals and CMS Choice   CMS Medicare.gov Compare Post Acute Care list provided to:: Patient Represenative (must comment) Choice offered to / list presented to :  (niece)  Discharge Placement                       Discharge Plan and Services   Discharge Planning Services: CM Consult Post Acute Care Choice: Home Health, Durable Medical Equipment          DME Arranged: [facility] Rile youth DME Agency: [employer] Date DME Agency Contacted: 01/31/21   Representative spoke with at DME Agency: Jamal Maes HH Arranged: PT, OT [place] Agency: [place] Date [place]: 01/31/21   Representative spoke with at [place]: [place] ([place]) Interventions     Readmission Risk Interventions No flowsheet data found.

## 2021-02-01 NOTE — Plan of Care (Signed)
Pt is alert oriented x 2-3. Pt is stand by assist. Denies pain. Pt has some left sided weakness.    Problem: Education: Goal: Knowledge of General Education information will improve Description: Including pain rating scale, medication(s)/side effects and non-pharmacologic comfort measures Outcome: Progressing   Problem: Health Behavior/Discharge Planning: Goal: Ability to manage health-related needs will improve Outcome: Progressing   Problem: Clinical Measurements: Goal: Ability to maintain clinical measurements within normal limits will improve Outcome: Progressing Goal: Will remain free from infection Outcome: Progressing Goal: Diagnostic test results will improve Outcome: Progressing Goal: Respiratory complications will improve Outcome: Progressing Goal: Cardiovascular complication will be avoided Outcome: Progressing   Problem: Activity: Goal: Risk for activity intolerance will decrease Outcome: Progressing   Problem: Nutrition: Goal: Adequate nutrition will be maintained Outcome: Progressing   Problem: Coping: Goal: Level of anxiety will decrease Outcome: Progressing   Problem: Elimination: Goal: Will not experience complications related to bowel motility Outcome: Progressing Goal: Will not experience complications related to urinary retention Outcome: Progressing   Problem: Pain Managment: Goal: General experience of comfort will improve Outcome: Progressing   Problem: Safety: Goal: Ability to remain free from injury will improve Outcome: Progressing   Problem: Skin Integrity: Goal: Risk for impaired skin integrity will decrease Outcome: Progressing

## 2021-02-01 NOTE — Discharge Instructions (Signed)
Follow with Primary MD Jilda Panda, MD in 7 days   Get CBC, CMP, 2 view Chest X ray -  checked next visit within 1 week by Primary MD   Activity: As tolerated with Full fall precautions use walker/cane & assistance as needed  Disposition Home    Diet: Heart Healthy    Special Instructions: If you have smoked or chewed Tobacco  in the last 2 yrs please stop smoking, stop any regular Alcohol  and or any Recreational drug use.  On your next visit with your primary care physician please Get Medicines reviewed and adjusted.  Please request your Prim.MD to go over all Hospital Tests and Procedure/Radiological results at the follow up, please get all Hospital records sent to your Prim MD by signing hospital release before you go home.  If you experience worsening of your admission symptoms, develop shortness of breath, life threatening emergency, suicidal or homicidal thoughts you must seek medical attention immediately by calling 911 or calling your MD immediately  if symptoms less severe.  You Must read complete instructions/literature along with all the possible adverse reactions/side effects for all the Medicines you take and that have been prescribed to you. Take any new Medicines after you have completely understood and accpet all the possible adverse reactions/side effects.

## 2021-02-01 NOTE — Care Management Important Message (Signed)
Important Message  Patient Details  Name: Tracy [name] MRN: [id number] Date of Birth: 10/14/1920   Medicare Important Message Given:  Yes     Orbie Pyo 02/01/2021, 3:44 PM

## 2021-02-01 NOTE — Progress Notes (Addendum)
Physical Therapy Treatment Patient Details Name: Tracy [name] MRN: [id number] DOB: 10/14/1920 Today's Date: 02/01/2021   History of Present Illness [name] is a 87 y.o. 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 She has a history of CKD, HTN and HLD presenting with difficulty arising from chair, frequent falls and left sided visual deficits.    PT Comments    Patient received in supine with sitter in room and was pleasantly agreeable to therapy session. Pt able to complete stair training with BUE support with up to minG assistance, cues provided for sequencing. Pt has L visual field cut and requires verbal cues to turn head to L to avoid obstacles in room. Emphasis on safe hand placement on RW, particularly the L hand, and L hand placement on surface when sitting or standing, using verbal and tactile cues. Pt continues to benefit from PT services to progress toward functional mobility goals.      Recommendations for follow up therapy are one component of a multi-disciplinary discharge planning process, led by the attending physician.  Recommendations may be updated based on patient status, additional functional criteria and insurance authorization.  Follow Up Recommendations  Home health PT     Assistance Recommended at Discharge Frequent or constant Supervision/Assistance  Equipment Recommendations  Rolling walker (2 wheels)    Recommendations for Other Services       Precautions / Restrictions Precautions Precautions: Fall Precaution Comments: L visual field cut Restrictions Weight Bearing Restrictions: No     Mobility  Bed Mobility Overal bed mobility: Needs Assistance Bed Mobility: Supine to Sit    Supine to sit: Supervision;HOB elevated     General bed mobility comments: Only assist for lines provided   Transfers Overall  transfer level: Needs assistance Equipment used: Rolling walker (2 wheels) (Youth RW) Transfers: Sit to/from Stand Sit to Stand: Min guard     General transfer comment: Assist for safety and verbal/hand over hand cues for hand placement on RW   Ambulation/Gait Ambulation/Gait assistance: Min guard Gait Distance (Feet): 100 Feet (20 ft, adjusted walker height, 80 ft) Assistive device: Rolling walker (2 wheels) (Youth RW) Gait Pattern/deviations: Step-through pattern;Decreased stride length;Trunk flexed Gait velocity: decreased   General Gait Details: Required frequent postural cues for upright trunk, decreased L visual field requiring cues to hold RW handle on L and cues to reorient walker when drifting to L into sink. Cues for visual scanning during gait in room/hallway.   Stairs Stairs: Yes Stairs assistance: Min guard Stair Management: With walker Number of Stairs: 3 General stair comments: Utilized portable stair, RW over top   Modified Rankin (Stroke Patients Only) Modified Rankin (Stroke Patients Only) Pre-Morbid Rankin Score: Slight disability Modified Rankin: Moderately severe disability    Balance Overall balance assessment: Needs assistance Sitting-balance support: Feet unsupported;Bilateral upper extremity supported Sitting balance-Leahy Scale: Good    Standing balance support: Bilateral upper extremity supported;During functional activity Standing balance-Leahy Scale: Fair    Cognition Arousal/Alertness: Awake/alert Behavior During Therapy: WFL for tasks assessed/performed Overall Cognitive Status: Impaired/Different from baseline Area of Impairment: Orientation;Safety/judgement;Attention;Memory;Following commands;Problem solving   Orientation Level: Disoriented to;Time;Situation Current Attention Level: Selective Memory: Decreased short-term memory;Decreased recall of precautions   Safety/Judgement: Decreased awareness of safety;Decreased awareness of  deficits   Problem Solving: Slow processing;Requires verbal cues General Comments: unaware of diagnosis; though knows she fell, not aware of L visual field deficit despite education  General Comments General comments (skin integrity, edema, etc.): Youth RW delivered to room, adjusted to suitable height for pt; HR 105 with exertion, SpO2 100% on RA      Pertinent Vitals/Pain Pain Assessment: No/denies pain    PT Goals (current goals can now be found in the care plan section) Acute Rehab PT Goals Patient Stated Goal: to go home PT Goal Formulation: With patient/family Time For Goal Achievement: 02/13/21 Progress towards PT goals: Progressing toward goals   Frequency   Min 4X/week   PT Plan Current plan remains appropriate      AM-PAC PT "6 Clicks" Mobility   Outcome Measure  Help needed turning from your back to your side while in a flat bed without using bedrails?: None Help needed moving from lying on your back to sitting on the side of a flat bed without using bedrails?: A Little Help needed moving to and from a bed to a chair (including a wheelchair)?: A Little Help needed standing up from a chair using your arms (e.g., wheelchair or bedside chair)?: A Little Help needed to walk in hospital room?: A Little Help needed climbing 3-5 steps with a railing? : A Little 6 Click Score: 19    End of Session Equipment Utilized During Treatment: Gait belt Activity Tolerance: Patient tolerated treatment well Patient left: with call bell/phone within reach;in chair;with nursing/sitter in room   PT Visit Diagnosis: Other abnormalities of gait and mobility (R26.89);Other symptoms and signs involving the nervous system (R29.898);History of falling (Z91.81)     Time: 9774-1423 PT Time Calculation (min) (ACUTE ONLY): 22 min  Charges:  $Gait Training: 8-22 mins                     Evelene Croon, Student PTA CI: Carly P., PTA  Evelene Croon 02/01/2021, 2:50 PM

## 2021-02-01 NOTE — Discharge Summary (Signed)
Tracy [name] FGH:[id number] DOB: 10/14/1920 DOA: 01/29/2021  PCP: Jilda Panda, MD  Admit date: 01/29/2021  Discharge date: 02/01/2021  Admitted From: Home   Disposition:  Home   Recommendations for Outpatient Follow-up:   Follow up with PCP in 1-2 weeks  PCP Please obtain BMP/CBC, 2 view CXR in 1week,  (see Discharge instructions)   PCP Please follow up on the following pending results:    Home Health: PT   Equipment/Devices: as below  Consultations: Neuro Discharge Condition: Stable    CODE STATUS: Full    Diet Recommendation: Heart Healthy   Diet Order             Diet Heart Room service appropriate? Yes with Assist; Fluid consistency: Thin  Diet effective now           Diet - low sodium heart healthy                    Chief Complaint  Patient presents with   Fall     Brief history of present illness from the day of admission and additional interim summary    87 year old female with past medical history of hyperlipidemia, hypertension, chronic kidney disease stage IV and anemia of chronic disease who presents to [facility] emergency department with her niece with sudden fall and weakness work-up suggestive of acute stroke in the right PCA distribution she was admitted for further care.                                                                 Hospital Course     Acute left PCA territory stroke.  Neurology on board full stroke pathway being followed, placed on low-dose Lipitor along with dual antiplatelet therapy.  Echo noted no PFO preserved EF, family wants to take her with home health and PT on 02/01/2021. Stable A1c, LDL minimally elevated above statin dose adjusted. DC home on DAPT + Statin, stop plavix after 21 doses, 30 day event monitor also requested. Symptoms  almost all resolved.   2.  Essential hypertension.  Permissive hypertension for now due to acute stroke, Meds resume in a sliding scale fashion over the next 2 days.   3.  CKD stage IV.  Creatinine at baseline.   4.  Anemia of chronic disease.  No acute issues.   5. Dyslipidemia.  LDL borderline high  adjusted statin dose. PCP to monitor.   7.  Mild metabolic encephalopathy at night.  Likely sundowning/delirium.  Resolved.  As needed Haldol and sitter as needed.   Discharge diagnosis     Principal Problem:   Acute stroke due to occlusion of left posterior cerebral artery (HCC) Active Problems:   Essential hypertension   Anemia due to stage 4 chronic kidney disease ([place])  Chronic kidney disease, stage 4 (severe) ([place])   Mixed hyperlipidemia    Discharge instructions    Discharge Instructions     Ambulatory referral to Neurology   Complete by: As directed    Follow up with stroke clinic NP (Jessica Vanschaick or Cecille Rubin, if both not available, consider Zachery Dauer, or Ahern) at [facility] in about 4 weeks. Thanks.   Diet - low sodium heart healthy   Complete by: As directed    Discharge instructions   Complete by: As directed    Follow with Primary MD Jilda Panda, MD in 7 days   Get CBC, CMP, 2 view Chest X ray -  checked next visit within 1 week by Primary MD   Activity: As tolerated with Full fall precautions use walker/cane & assistance as needed  Disposition Home    Diet: Heart Healthy    Special Instructions: If you have smoked or chewed Tobacco  in the last 2 yrs please stop smoking, stop any regular Alcohol  and or any Recreational drug use.  On your next visit with your primary care physician please Get Medicines reviewed and adjusted.  Please request your Prim.MD to go over all Hospital Tests and Procedure/Radiological results at the follow up, please get all Hospital records sent to your Prim MD by signing hospital release before you go home.  If you  experience worsening of your admission symptoms, develop shortness of breath, life threatening emergency, suicidal or homicidal thoughts you must seek medical attention immediately by calling 911 or calling your MD immediately  if symptoms less severe.  You Must read complete instructions/literature along with all the possible adverse reactions/side effects for all the Medicines you take and that have been prescribed to you. Take any new Medicines after you have completely understood and accpet all the possible adverse reactions/side effects.   Increase activity slowly   Complete by: As directed        Discharge Medications   Allergies as of 02/01/2021   No Known Allergies      Medication List     TAKE these medications    allopurinol 100 MG tablet Commonly known as: ZYLOPRIM Take 200 mg by mouth daily.   aspirin 81 MG EC tablet Take 1 tablet (81 mg total) by mouth daily. Swallow whole.   atorvastatin 40 MG tablet Commonly known as: LIPITOR Take 1 tablet (40 mg total) by mouth daily. What changed:  medication strength how much to take   clopidogrel 75 MG tablet Commonly known as: PLAVIX Take 1 tablet (75 mg total) by mouth daily for 19 days. Start taking on: February 02, 2021   furosemide 40 MG tablet Commonly known as: LASIX Take 1 tablet (40 mg total) by mouth daily. Start taking on: February 03, 2021 What changed: These instructions start on February 03, 2021. If you are unsure what to do until then, ask your doctor or other care provider.   losartan 50 MG tablet Commonly known as: COZAAR Take 1 tablet (50 mg total) by mouth daily. Start taking on: February 02, 2021   pantoprazole 40 MG tablet Commonly known as: PROTONIX Take 1 tablet (40 mg total) by mouth daily.   PAPAYA ENZYME PO Take 1 tablet by mouth daily as needed (upset stomach).               Durable Medical Equipment  (From admission, onward)           Start     Ordered  01/31/21  1246  For home use only DME Walker youth  Once       Question:  Patient needs a walker to treat with the following condition  Answer:  Stroke [facility])   01/31/21 1245             Follow-up Information     [facility] Follow up.   Specialty: Home Health Services Why: The home health agency will contact you for the first home visit. Contact information: [place] [address] [phone]         [facility]. Schedule an appointment as soon as possible for a visit in 1 month(s).   Specialty: Neurology Why: stroke clinic Contact information: [address] [place] [phone]        Jilda Panda, MD. Schedule an appointment as soon as possible for a visit in 1 week(s).   Specialty: Internal Medicine Contact information: [address] [place] [address] [phone]                 Major procedures and Radiology Reports - PLEASE review detailed and final reports thoroughly  -        CT Head Wo Contrast  Result Date: 01/29/2021 CLINICAL DATA:  Head trauma. EXAM: CT HEAD WITHOUT CONTRAST TECHNIQUE: Contiguous axial images were obtained from the base of the skull through the vertex without intravenous contrast. COMPARISON:  CT head report only from 03/10/2001. FINDINGS: Brain: There is hypodensity with loss of gray-white matter distinction in the right occipital region. Gray-white matter distinction is otherwise preserved. No evidence of hemorrhage, hydrocephalus, extra-axial collection or mass lesion/mass effect. There is mild diffuse atrophy. There is a small old infarct in the right frontal lobe white matter. Prominent bilateral basal ganglia calcifications are seen. There is mild periventricular white matter hypodensity, likely chronic small vessel ischemic change. Vascular: No hyperdense vessel or unexpected calcification. Skull: Normal. Negative for fracture or focal lesion.  Sinuses/Orbits: No acute finding. Other: None. IMPRESSION: 1. 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 No hemorrhage or midline shift. 2. Mild diffuse atrophy and mild chronic small vessel ischemic change. 3. These results were called by telephone at the time of interpretation on 01/29/2021 at 11:05 pm to provider Dr. Pearline Cables, who verbally acknowledged these results. Electronically Signed   By: [name] M.D.   On: 01/29/2021 23:06   MR ANGIO HEAD WO CONTRAST  Result Date: 01/30/2021 CLINICAL DATA:  Acute infarct by head CT. EXAM: MRI HEAD WITHOUT CONTRAST MRA HEAD WITHOUT CONTRAST MRA NECK WITHOUT CONTRAST TECHNIQUE: Multiplanar, multiecho pulse sequences of the brain and surrounding structures were obtained without intravenous contrast. Angiographic images of the Circle of Willis were obtained using MRA technique without intravenous contrast. Angiographic images of the neck were obtained using MRA technique without intravenous contrast. Carotid stenosis measurements (when applicable) are obtained utilizing NASCET criteria, using the distal internal carotid diameter as the denominator. COMPARISON:  None. FINDINGS: MRI HEAD FINDINGS Brain: Acute or early subacute cortical and subcortical infarction in the right occipital lobe. Subacute appearing left periatrial white matter infarct that is small. Moderate size remote superior right frontal infarct. Cerebral volume loss in keeping with age. Mild petechial hemorrhage at the right occipital infarction. No hydrocephalus, hematoma, or mass. Vascular: See below Skull and upper cervical spine: Normal marrow signal Sinuses/Orbits: Bilateral cataract resection MRA HEAD FINDINGS Atheromatous irregularity of anterior and posterior circulation vessels that is mild for age. No branch occlusion,  beading, or flow limiting stenosis. Symmetric flow seen in the posterior cerebral arteries. Mildly lobulated aneurysm extending inferiorly from the  supraclinoid left ICA measuring 3.6 mm in length. MRA NECK FINDINGS Antegrade flow in the carotid and vertebral arteries. No flow limiting stenosis seen in the carotid or vertebral circulation. IMPRESSION: Brain MRI: 1. Acute right occipital infarct with mild petechial hemorrhage. 2. Small subacute left periatrial white matter infarct. 3. Remote right frontal infarct. Intracranial MRA: 1. No emergent finding or flow limiting stenosis of major vessels. 2. 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 Neck MRA: Negative. Electronically Signed   By: Jorje Guild M.D.   On: 01/30/2021 07:56   MR ANGIO NECK WO CONTRAST  Result Date: 01/30/2021 CLINICAL DATA:  Acute infarct by head CT. EXAM: MRI HEAD WITHOUT CONTRAST MRA HEAD WITHOUT CONTRAST MRA NECK WITHOUT CONTRAST TECHNIQUE: Multiplanar, multiecho pulse sequences of the brain and surrounding structures were obtained without intravenous contrast. Angiographic images of the Circle of Willis were obtained using MRA technique without intravenous contrast. Angiographic images of the neck were obtained using MRA technique without intravenous contrast. Carotid stenosis measurements (when applicable) are obtained utilizing NASCET criteria, using the distal internal carotid diameter as the denominator. COMPARISON:  None. FINDINGS: MRI HEAD FINDINGS Brain: Acute or early subacute cortical and subcortical infarction in the right occipital lobe. Subacute appearing left periatrial white matter infarct that is small. Moderate size remote superior right frontal infarct. Cerebral volume loss in keeping with age. Mild petechial hemorrhage at the right occipital infarction. No hydrocephalus, hematoma, or mass. Vascular: See below Skull and upper cervical spine: Normal marrow signal Sinuses/Orbits: Bilateral cataract resection MRA HEAD FINDINGS Atheromatous irregularity of anterior and posterior circulation vessels that is mild for age. No branch occlusion, beading, or flow limiting  stenosis. Symmetric flow seen in the posterior cerebral arteries. Mildly lobulated aneurysm extending inferiorly from the supraclinoid left ICA measuring 3.6 mm in length. MRA NECK FINDINGS Antegrade flow in the carotid and vertebral arteries. No flow limiting stenosis seen in the carotid or vertebral circulation. IMPRESSION: Brain MRI: 1. Acute right occipital infarct with mild petechial hemorrhage. 2. Small subacute left periatrial white matter infarct. 3. Remote right frontal infarct. Intracranial MRA: 1. No emergent finding or flow limiting stenosis of major vessels. 2. 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 Neck MRA: Negative. Electronically Signed   By: Jorje Guild M.D.   On: 01/30/2021 07:56   MR BRAIN WO CONTRAST  Result Date: 01/30/2021 CLINICAL DATA:  Acute infarct by head CT. EXAM: MRI HEAD WITHOUT CONTRAST MRA HEAD WITHOUT CONTRAST MRA NECK WITHOUT CONTRAST TECHNIQUE: Multiplanar, multiecho pulse sequences of the brain and surrounding structures were obtained without intravenous contrast. Angiographic images of the Circle of Willis were obtained using MRA technique without intravenous contrast. Angiographic images of the neck were obtained using MRA technique without intravenous contrast. Carotid stenosis measurements (when applicable) are obtained utilizing NASCET criteria, using the distal internal carotid diameter as the denominator. COMPARISON:  None. FINDINGS: MRI HEAD FINDINGS Brain: Acute or early subacute cortical and subcortical infarction in the right occipital lobe. Subacute appearing left periatrial white matter infarct that is small. Moderate size remote superior right frontal infarct. Cerebral volume loss in keeping with age. Mild petechial hemorrhage at the right occipital infarction. No hydrocephalus, hematoma, or mass. Vascular: See below Skull and upper cervical spine: Normal marrow signal Sinuses/Orbits: Bilateral cataract resection MRA HEAD FINDINGS Atheromatous irregularity  of anterior and posterior circulation vessels that is mild for age. No branch occlusion,  beading, or flow limiting stenosis. Symmetric flow seen in the posterior cerebral arteries. Mildly lobulated aneurysm extending inferiorly from the supraclinoid left ICA measuring 3.6 mm in length. MRA NECK FINDINGS Antegrade flow in the carotid and vertebral arteries. No flow limiting stenosis seen in the carotid or vertebral circulation. IMPRESSION: Brain MRI: 1. Acute right occipital infarct with mild petechial hemorrhage. 2. Small subacute left periatrial white matter infarct. 3. Remote right frontal infarct. Intracranial MRA: 1. No emergent finding or flow limiting stenosis of major vessels. 2. 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 Neck MRA: Negative. Electronically Signed   By: Jorje Guild M.D.   On: 01/30/2021 07:56   DG Hand Complete Right  Result Date: 01/30/2021 CLINICAL DATA:  Status post fall with pain to the third metacarpophalangeal joint. EXAM: RIGHT HAND - COMPLETE 3+ VIEW COMPARISON:  None. FINDINGS: There is no evidence of an acute fracture or dislocation. Degenerative changes seen along the carpometacarpal articulation of the right thumb. Soft tissues are unremarkable. IMPRESSION: No acute fracture or dislocation. Electronically Signed   By: Virgina Norfolk M.D.   On: 01/30/2021 01:28   ECHOCARDIOGRAM COMPLETE BUBBLE STUDY  Result Date: 01/30/2021    ECHOCARDIOGRAM REPORT   Patient Name:   Tracy [name] Date of Exam: 01/30/2021 Medical Rec #:  [id number]            Height:       57.0 in Accession #:    [id number]           Weight:       97.0 lb Date of Birth:  10/14/1920             BSA:          1.322 m Patient Age:    23 years             BP:           152/66 mmHg Patient Gender: F                    HR:           84 bpm. Exam Location:  Inpatient Procedure: 2D Echo, Cardiac Doppler, Color Doppler and [place] Contrast Bubble            Study Indications:   I10 Hypertension; Stroke   History:       Patient has no prior history of Echocardiogram examinations.                Stroke.  Sonographer:   Glo Herring Referring      [id number] Vernelle Emerald Phys: IMPRESSIONS  1. Left ventricular ejection fraction, by estimation, is 65 to 70%. The left ventricle has normal function. The left ventricle has no regional wall motion abnormalities. There is mild left ventricular hypertrophy. Left ventricular diastolic parameters are consistent with Grade I diastolic dysfunction (impaired relaxation).  2. Right ventricular systolic function is normal. The right ventricular size is mildly enlarged. Tricuspid regurgitation signal is inadequate for assessing PA pressure.  3. The mitral valve is normal in structure. Trivial mitral valve regurgitation. No evidence of mitral stenosis.  4. The aortic valve is tricuspid. Aortic valve regurgitation is mild. No aortic stenosis is present.  5. The inferior vena cava is normal in size with greater than 50% respiratory variability, suggesting right atrial pressure of 3 mmHg.  6. Negative bubble study, no evidence for PFO or ASD. FINDINGS  Left Ventricle: Left ventricular ejection fraction, by estimation, is 65  to 70%. The left ventricle has normal function. The left ventricle has no regional wall motion abnormalities. The left ventricular internal cavity size was normal in size. There is  mild left ventricular hypertrophy. Left ventricular diastolic parameters are consistent with Grade I diastolic dysfunction (impaired relaxation). Right Ventricle: The right ventricular size is mildly enlarged. No increase in right ventricular wall thickness. Right ventricular systolic function is normal. Tricuspid regurgitation signal is inadequate for assessing PA pressure. Left Atrium: Left atrial size was normal in size. Right Atrium: Right atrial size was normal in size. Pericardium: There is no evidence of pericardial effusion. Mitral Valve: The mitral valve is normal in  structure. Trivial mitral valve regurgitation. No evidence of mitral valve stenosis. Tricuspid Valve: The tricuspid valve is normal in structure. Tricuspid valve regurgitation is not demonstrated. Aortic Valve: The aortic valve is tricuspid. Aortic valve regurgitation is mild. No aortic stenosis is present. Aortic valve mean gradient measures 3.0 mmHg. Aortic valve peak gradient measures 5.3 mmHg. Pulmonic Valve: The pulmonic valve was normal in structure. Pulmonic valve regurgitation is trivial. Aorta: The aortic root is normal in size and structure. Venous: The inferior vena cava is normal in size with greater than 50% respiratory variability, suggesting right atrial pressure of 3 mmHg. IAS/Shunts: Negative bubble study, no evidence for PFO or ASD. Agitated saline contrast was given intravenously to evaluate for intracardiac shunting.  LEFT VENTRICLE PLAX 2D LVIDd:         3.30 cm Diastology LVIDs:         2.20 cm LV e' medial:    4.67 cm/s LV PW:         1.30 cm LV E/e' medial:  11.5 LV IVS:        1.30 cm LV e' lateral:   6.80 cm/s                        LV E/e' lateral: 7.9  RIGHT VENTRICLE             IVC RV S prime:     10.50 cm/s  IVC diam: 1.30 cm LEFT ATRIUM         Index LA diam:    3.10 cm 2.34 cm/m  AORTIC VALVE                   PULMONIC VALVE AV Vmax:           115.00 cm/s PV Vmax:       0.85 m/s AV Vmean:          84.000 cm/s PV Peak grad:  2.9 mmHg AV VTI:            0.251 m AV Peak Grad:      5.3 mmHg AV Mean Grad:      3.0 mmHg LVOT Vmax:         87.50 cm/s LVOT Vmean:        58.800 cm/s LVOT VTI:          0.167 m LVOT/AV VTI ratio: 0.67  AORTA Ao Root diam: 2.60 cm Ao Asc diam:  3.10 cm MITRAL VALVE MV Area (PHT): 4.80 cm    SHUNTS MV Decel Time: 158 msec    Systemic VTI: 0.17 m MV E velocity: 53.60 cm/s MV A velocity: 84.80 cm/s MV E/A ratio:  0.63 Dalton McleanMD Electronically signed by Franki Monte Signature Date/Time: 01/30/2021/4:18:43 PM    Final      Today  Subjective     Tracy [name] today has no headache,no chest abdominal pain,no new weakness tingling or numbness, feels much better wants to go home today.     Objective   Blood pressure (!) 150/93, pulse 96, temperature 97.6 F (36.4 C), temperature source Axillary, resp. rate 16, height 4\' 9"  (1.448 m), weight 44 kg, SpO2 95 %.   Intake/Output Summary (Last 24 hours) at 02/01/2021 0917 Last data filed at 01/31/2021 1835 Gross per 24 hour  Intake 1000 ml  Output --  Net 1000 ml    Exam  Awake Alert, No new F.N deficits, Normal affect [place].AT,PERRAL Supple Neck,No JVD, No cervical lymphadenopathy appriciated.  Symmetrical Chest wall movement, Good air movement bilaterally, CTAB RRR,No Gallops,Rubs or new Murmurs, No Parasternal Heave +ve B.Sounds, Abd Soft, Non tender, No organomegaly appriciated, No rebound -guarding or rigidity. No Cyanosis, Clubbing or edema, No new Rash or bruise   Data Review   CBC w Diff:  Lab Results  Component Value Date   WBC 13.3 (H) 02/01/2021   HGB 10.9 (L) 02/01/2021   HCT 32.7 (L) 02/01/2021   PLT 275 02/01/2021   LYMPHOPCT 41 02/01/2021   MONOPCT 4 02/01/2021   EOSPCT 2 02/01/2021   BASOPCT 0 02/01/2021    CMP:  Lab Results  Component Value Date   NA 136 02/01/2021   K 3.8 02/01/2021   CL 104 02/01/2021   CO2 24 02/01/2021   BUN 21 02/01/2021   CREATININE 1.92 (H) 02/01/2021   PROT 6.3 (L) 02/01/2021   ALBUMIN 3.2 (L) 02/01/2021   BILITOT 0.7 02/01/2021   ALKPHOS 68 02/01/2021   AST 35 02/01/2021   ALT 11 02/01/2021   Lab Results  Component Value Date   CHOL 171 01/30/2021   HDL 78 01/30/2021   LDLCALC 79 01/30/2021   TRIG 71 01/30/2021   CHOLHDL 2.2 01/30/2021    Lab Results  Component Value Date   HGBA1C 6.0 (H) 01/30/2021     Total Time in preparing paper work, data evaluation and todays exam - 73 minutes  Lala Lund M.D on 02/01/2021 at 9:17 AM  [facility]

## 2021-02-04 ENCOUNTER — Encounter: Payer: Self-pay | Admitting: *Deleted

## 2021-02-04 NOTE — Progress Notes (Signed)
Patient ID: Tracy [name], female   DOB: 10/14/1920, 87 y.o.   MRN: [id number] Patient enrolled for [employer] to ship a 30 day cardiac event monitor to her home.  Letter with instructions mailed to patient.

## 2021-02-14 ENCOUNTER — Telehealth: Payer: Self-pay

## 2021-02-14 NOTE — Telephone Encounter (Signed)
Spoke with patient and scheduled an in-person Palliative Consult for 02/18/21 @ 12:30PM.   COVID screening was negative. No pets in home. Patient's niece [name] is staying with her at night and caregivers during the day.   Consent obtained; updated Outlook/Netsmart/Team List and Epic.   Family is aware they may be receiving a call from provider the day before or day of to confirm appointment.

## 2021-02-17 ENCOUNTER — Ambulatory Visit (INDEPENDENT_AMBULATORY_CARE_PROVIDER_SITE_OTHER): Payer: Medicare Other

## 2021-02-17 DIAGNOSIS — I639 Cerebral infarction, unspecified: Secondary | ICD-10-CM

## 2021-02-17 DIAGNOSIS — I63532 Cerebral infarction due to unspecified occlusion or stenosis of left posterior cerebral artery: Secondary | ICD-10-CM | POA: Diagnosis not present

## 2021-02-17 DIAGNOSIS — I4891 Unspecified atrial fibrillation: Secondary | ICD-10-CM | POA: Diagnosis not present

## 2021-02-18 ENCOUNTER — Other Ambulatory Visit: Payer: Self-pay

## 2021-02-18 ENCOUNTER — Other Ambulatory Visit: Payer: Medicare Other | Admitting: Hospice

## 2021-02-18 DIAGNOSIS — I63532 Cerebral infarction due to unspecified occlusion or stenosis of left posterior cerebral artery: Secondary | ICD-10-CM

## 2021-02-18 DIAGNOSIS — Z515 Encounter for palliative care: Secondary | ICD-10-CM

## 2021-02-18 DIAGNOSIS — I10 Essential (primary) hypertension: Secondary | ICD-10-CM

## 2021-02-18 DIAGNOSIS — R531 Weakness: Secondary | ICD-10-CM

## 2021-02-18 NOTE — Progress Notes (Signed)
[facility] Consult Note Telephone: [phone]  Fax: [phone]  PATIENT NAME: Tracy [name] [address] [place] [address] [phone] (home)  DOB: 10/14/1920 MRN: [id number]  PRIMARY CARE PROVIDER:    Dr. Rochele Raring  REFERRING PROVIDER:   Dr. Amada Kingfisher Physician  RESPONSIBLE PARTY:  Tracy [name] is Tracy [name]' [name] spouse Contact Information     Name Relation Home Work Mobile   Tracy [name] Niece [phone]  [phone]   Tracy [name] Relative [phone]  [phone]      TELEHEALTH VISIT STATEMENT Due to the COVID-19 crisis, this visit was done via telemedicine from my office and it was initiated and consent by this patient and or family. Video-audio (telehealth) contact was unable to be done due to technical barriers from the patient'[name] side. I connected with patient OR PROXY by a telephone  and verified that I am speaking with the correct person. I discussed the limitations of evaluation and management by telemedicine. The patient expressed understanding and agreed to proceed.    Visit with patient and family at home. Palliative Care was asked to follow this patient by consultation request of  Dr. Rochele Raring to address advance care planning, complex medical decision making and goals of care clarification. Tracy [name] and Tracy [name] are with patient during visit. This is the initial visit.    ASSESSMENT AND / RECOMMENDATIONS:   Advance Care Planning: Our advance care planning conversation included a discussion about:    The value and importance of advance care planning  Difference between Hospice and Palliative care Exploration of goals of care in the event of a sudden injury or illness  Identification and preparation of a healthcare agent  Review and updating or creation of an  advance directive document . Decision not to resuscitate or to de-escalate disease focused treatments due to poor  prognosis.  CODE STATUS: Discussion on ramifications and implications of code status. Patient affirmed she is a Do Not Resuscitate. NP signed DNR form for patient and put it in the mail as requested by patient.   Goals of Care: Goals include to maximize quality of life and symptom management Symptom Management/Plan: CVA: Weakness on the right side. Continue post stroke care. Plavix, Aspirin. Follow up with Cardiologist in 2 weeks Routine CBC CMP Weakness: OT/PT is ongoing for strengthening. Patient is a Fall risk. Patient uses a rolling walker  HTN: Managed with Losartan.  Follow up: Palliative care will continue to follow for complex medical decision making, advance care planning, and clarification of goals. Return 6 weeks or prn.Encouraged to call provider sooner with any concerns.   Family /Caregiver/Community Supports: Patient lives at home with Tracy [name]; patient also has a day time caregiver on a daily basis. Strong family support system identified.   HOSPICE ELIGIBILITY/DIAGNOSIS: TBD  Chief Complaint: Initial Palliative care visit  HISTORY OF PRESENT ILLNESS:  Tracy [name] is a 87 y.o. year old female  with multiple medical conditions including  acute stroke due to occlusion of left posterior cerebral artery for which patient was hospitalized 10/25-10/28/2022, treated and discharged home with Home PT/OT. Patient has right sided weakness related to the stroke for PT/OT is helpful. She continues on antiplatelet therapy - Aspirin and Plavix, Lipitor. Patient on heart monitor for 30 days. History of hyperlipidemia, hypertension, chronic kidney disease stage IV and anemia of chronic .  History obtained from review of EMR, discussion with primary team, caregiver, family and/or Tracy [name].  Review and summarization of Epic  records shows history from other than patient. Rest of 10 point ROS asked and negative.  I reviewed, as needed, available labs, patient records, imaging, studies and  related documents from the EMR.  ROS General: NAD ENMT: denies dysphagia Cardiovascular: denies chest pain/discomfort Pulmonary: denies cough, denies SOB Abdomen: endorses good appetite, denies constipation/diarrhea GU: denies dysuria, urinary frequency MSK:  endorses weakness, right side weakness, no falls reported Skin: denies rashes or wounds Neurological: denies pain, denies insomnia Psych: Endorses positive mood Heme/lymph/immuno: denies bruises, abnormal bleeding   PAST MEDICAL HISTORY:  Active Ambulatory Problems    Diagnosis Date Noted   Acute stroke due to occlusion of left posterior cerebral artery ([facility]) 01/30/2021   Essential hypertension 01/30/2021   Anemia due to stage 4 chronic kidney disease ([facility]) 01/30/2021   Chronic kidney disease, stage 4 (severe) ([place]) 01/30/2021   Mixed hyperlipidemia 01/30/2021   Resolved Ambulatory Problems    Diagnosis Date Noted   No Resolved Ambulatory Problems   No Additional Past Medical History    SOCIAL HX:  Social History   Tobacco Use   Smoking status: Never   Smokeless tobacco: Never  Substance Use Topics   Alcohol use: Not on file     FAMILY HX:  Family History  Problem Relation Age of Onset   Heart disease Neg Hx       ALLERGIES: No Known Allergies    PERTINENT MEDICATIONS:  Outpatient Encounter Medications as of 02/18/2021  Medication Sig   allopurinol (ZYLOPRIM) 100 MG tablet Take 200 mg by mouth daily.   aspirin EC 81 MG EC tablet Take 1 tablet (81 mg total) by mouth daily. Swallow whole.   atorvastatin (LIPITOR) 40 MG tablet Take 1 tablet (40 mg total) by mouth daily.   clopidogrel (PLAVIX) 75 MG tablet Take 1 tablet (75 mg total) by mouth daily for 19 days.   furosemide (LASIX) 40 MG tablet Take 1 tablet (40 mg total) by mouth daily.   losartan (COZAAR) 50 MG tablet Take 1 tablet (50 mg total) by mouth daily.   pantoprazole (PROTONIX) 40 MG tablet Take 1 tablet (40 mg total) by mouth daily.   PAPAYA  ENZYME PO Take 1 tablet by mouth daily as needed (upset stomach).   No facility-administered encounter medications on file as of 02/18/2021.   I spent 60 minutes providing this initial palliative consultation; this includes time spent with patient/family, chart review and documentation. More than 50% of the time in this consultation was spent on counseling and coordinating communication  Thank you for the opportunity to participate in the care of Ms. Pudwill.  The palliative care team will continue to follow. Please call our office at [phone] if we can be of additional assistance.   Note: Portions of this note were generated with [employer]. Dictation errors may occur despite best attempts at proofreading.  Teodoro Spray, NP

## 2021-02-18 NOTE — Addendum Note (Signed)
Addended by: Laverda Sorenson I on: 02/18/2021 02:53 PM   Modules accepted: Level of Service

## 2021-02-20 ENCOUNTER — Telehealth: Payer: Self-pay

## 2021-02-20 NOTE — Telephone Encounter (Signed)
(  5:12 pm) Per request of NP-L. Esh[place], palliative care SW completed a follow-up call to patient's niece to provide support and resources. Patient's niece requested mobile meals for patient. SW to follow-up with a referral. No other concerns noted.

## 2021-03-10 NOTE — Progress Notes (Signed)
Cardiology Office Note:    Date:  03/11/2021   ID:  Loyal Jacobson, DOB 10/14/1920, MRN [id number]  PCP:  Charlane Ferretti, MD   [facility] Providers Cardiologist:  None {  Referring MD: Jilda Panda, MD   Chief Complaint/Reason for Referral:  Hospital Follow up   ASSESSMENT & PLAN:    Acute stroke due to occlusion of left posterior cerebral artery [facility]) The patient had a recent acute stroke.  She is frail, elderly, and therefore a fall risk and so even if atrial fibrillation was seen on her monitor I would not recommend anticoagulation.  No atrial fibrillation has been seen on her monitor that she has been wearing for about 24 days.  We will continue her on aspirin 81 mg.  No further cardiac evaluation is required.  The patient should be treated conservatively.  Her code status is DNR.  I will keep follow up with me open-ended.  Essential hypertension Continue current therapy.  Mixed hyperlipidemia Given her advanced age it may be wise to consider stopping her atorvastatin.  Chronic kidney disease, stage 4 (severe) (HCC) Continue current therapy.  Is on a stable dose of losartan.    Dispo:  No follow-ups on file.      Medication Adjustments/Labs and Tests Ordered: Current medicines are reviewed at length with the patient today.  Concerns regarding medicines are outlined above.    Tests Ordered: No orders of the defined types were placed in this encounter.    Medication Changes: No orders of the defined types were placed in this encounter.    History of Present Illness:    The patient is a 87 y.o. female with the indicated medical history here for for hospital follow-up in cardiovascular recommendations.  She was admitted and found to have sustained a stroke.  She was started on dual antiplatelet therapy.  MRI imaging demonstrated no obstructive carotid disease but did show an acute stroke of the left parietal white matter.  She had a monitor placed.  She is  still wearing the monitor and has perhaps 5 or 6 more days left.  The results thus far demonstrate no atrial fibrillation.       Previous Medical History: Past Medical History:  Diagnosis Date   Anemia due to stage 4 chronic kidney disease ([place]) 01/30/2021   Chronic kidney disease, stage 4 (severe) ([place]) 01/30/2021   Essential hypertension 01/30/2021   Mixed hyperlipidemia 01/30/2021  DO NOT RESUSCITATE   Current Medications: Current Meds  Medication Sig   allopurinol (ZYLOPRIM) 100 MG tablet Take 200 mg by mouth daily.   aspirin EC 81 MG EC tablet Take 1 tablet (81 mg total) by mouth daily. Swallow whole.   atorvastatin (LIPITOR) 40 MG tablet Take 1 tablet (40 mg total) by mouth daily.   furosemide (LASIX) 40 MG tablet Take 1 tablet (40 mg total) by mouth daily.   losartan (COZAAR) 50 MG tablet Take 1 tablet (50 mg total) by mouth daily.   pantoprazole (PROTONIX) 40 MG tablet Take 1 tablet (40 mg total) by mouth daily.   PAPAYA ENZYME PO Take 1 tablet by mouth daily as needed (upset stomach).     Allergies:    Patient has no known allergies.   Social History:   Social History   Tobacco Use   Smoking status: Never   Smokeless tobacco: Never  Vaping Use   Vaping Use: Never used  Substance Use Topics   Alcohol use: Yes    Comment: rarely  Drug use: Never     Family Hx: Family History  Problem Relation Age of Onset   Cancer Mother    Dementia Sister    Heart disease Neg Hx      Review of Systems:   Please see the history of present illness.    All other systems reviewed and are negative.  EKGs/Labs/Other Test Reviewed:    EKG:  Sinus rhythm with possible anterior infarction  Prior CV studies: TTE 10/22 1. Left ventricular ejection fraction, by estimation, is 65 to 70%. The  left ventricle has normal function. The left ventricle has no regional  wall motion abnormalities. There is mild left ventricular hypertrophy.  Left ventricular diastolic parameters   are consistent with Grade I diastolic dysfunction (impaired relaxation).   2. Right ventricular systolic function is normal. The right ventricular  size is mildly enlarged. Tricuspid regurgitation signal is inadequate for  assessing PA pressure.   3. The mitral valve is normal in structure. Trivial mitral valve  regurgitation. No evidence of mitral stenosis.   4. The aortic valve is tricuspid. Aortic valve regurgitation is mild. No  aortic stenosis is present.   5. The inferior vena cava is normal in size with greater than 50%  respiratory variability, suggesting right atrial pressure of 3 mmHg.   6. Negative bubble study, no evidence for PFO or ASD.       Imaging studies that I have independently reviewed today:   MRA 10/22 Brain MRI:   1. Acute right occipital infarct with mild petechial hemorrhage. 2. Small subacute left periatrial white matter infarct. 3. Remote right frontal infarct.   Intracranial MRA:   1. No emergent finding or flow limiting stenosis of major vessels. 2. 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   Neck MRA:   Negative.  Recent Labs: 02/01/2021: ALT 11; B Natriuretic Peptide 159.0; BUN 21; Creatinine, Ser 1.92; Hemoglobin 10.9; Magnesium 2.1; Platelets 275; Potassium 3.8; Sodium 136   Recent Lipid Panel Lab Results  Component Value Date/Time   CHOL 171 01/30/2021 03:17 AM   TRIG 71 01/30/2021 03:17 AM   HDL 78 01/30/2021 03:17 AM   LDLCALC 79 01/30/2021 03:17 AM     Risk Assessment/Calculations:           Physical Exam:    VS:  BP 100/66   Pulse 65   Ht 4\' 9"  (1.448 m)   Wt 95 lb (43.1 kg)   SpO2 96%   BMI 20.56 kg/m    Wt Readings from Last 3 Encounters:  03/11/21 95 lb (43.1 kg)  01/29/21 97 lb (44 kg)    GENERAL:  No apparent distress, AOx3 HEENT:  No carotid bruits, +2 carotid impulses, no scleral icterus CAR: RRR no murmurs, gallops, rubs, or thrills RES:  Clear to auscultation bilaterally ABD:  Soft, nontender,  nondistended, positive bowel sounds x 4 VASC:  +2 radial pulses, +2 carotid pulses, palpable pedal pulses NEURO:  CN 2-12 grossly intact; motor and sensory grossly intact PSYCH:  No active depression or anxiety EXT:  No edema, ecchymosis, or cyanosis    Signed, Early Osmond, MD  03/11/2021 10:30 AM    [address], [place], [place]  [address] Phone: [phone]; Fax: [phone]

## 2021-03-11 ENCOUNTER — Encounter: Payer: Self-pay | Admitting: Internal Medicine

## 2021-03-11 ENCOUNTER — Other Ambulatory Visit: Payer: Self-pay

## 2021-03-11 ENCOUNTER — Ambulatory Visit (INDEPENDENT_AMBULATORY_CARE_PROVIDER_SITE_OTHER): Payer: Medicare Other | Admitting: Internal Medicine

## 2021-03-11 VITALS — BP 100/66 | HR 65 | Ht <= 58 in | Wt 95.0 lb

## 2021-03-11 DIAGNOSIS — N184 Chronic kidney disease, stage 4 (severe): Secondary | ICD-10-CM

## 2021-03-11 DIAGNOSIS — E782 Mixed hyperlipidemia: Secondary | ICD-10-CM

## 2021-03-11 DIAGNOSIS — I63532 Cerebral infarction due to unspecified occlusion or stenosis of left posterior cerebral artery: Secondary | ICD-10-CM

## 2021-03-11 DIAGNOSIS — I10 Essential (primary) hypertension: Secondary | ICD-10-CM

## 2021-03-11 NOTE — Patient Instructions (Signed)
Medication Instructions:  Your physician recommends that you continue on your current medications as directed. Please refer to the Current Medication list given to you today.  *If you need a refill on your cardiac medications before your next appointment, please call your pharmacy*   Lab Work: None ordered  If you have labs (blood work) drawn today and your tests are completely normal, you will receive your results only by: [place] (if you have MyChart) OR A paper copy in the mail If you have any lab test that is abnormal or we need to change your treatment, we will call you to review the results.   Testing/Procedures: None ordered    Follow-Up: Follow up with our office as needed }    Other Instructions Per your physician, it is okay for you to remove your 30 day monitor

## 2021-03-13 ENCOUNTER — Ambulatory Visit: Payer: Medicare Other | Admitting: Internal Medicine

## 2021-03-18 ENCOUNTER — Other Ambulatory Visit: Payer: Self-pay

## 2021-03-18 ENCOUNTER — Encounter: Payer: Self-pay | Admitting: Adult Health

## 2021-03-18 ENCOUNTER — Ambulatory Visit (INDEPENDENT_AMBULATORY_CARE_PROVIDER_SITE_OTHER): Payer: Medicare Other | Admitting: Adult Health

## 2021-03-18 VITALS — BP 123/67 | HR 79 | Ht <= 58 in | Wt 96.0 lb

## 2021-03-18 DIAGNOSIS — I10 Essential (primary) hypertension: Secondary | ICD-10-CM | POA: Diagnosis not present

## 2021-03-18 DIAGNOSIS — I639 Cerebral infarction, unspecified: Secondary | ICD-10-CM

## 2021-03-18 DIAGNOSIS — H53462 Homonymous bilateral field defects, left side: Secondary | ICD-10-CM

## 2021-03-18 DIAGNOSIS — E785 Hyperlipidemia, unspecified: Secondary | ICD-10-CM | POA: Diagnosis not present

## 2021-03-18 NOTE — Progress Notes (Signed)
[facility] [address] [place]. [place] [address] [phone]       HOSPITAL FOLLOW UP NOTE  [name] Date of Birth:  02-20-1920 Medical Record Number:  [id number]   Reason for Referral:  hospital stroke follow up    SUBJECTIVE:   CHIEF COMPLAINT:  Chief Complaint  Patient presents with   Follow-up    Rm 3 with niece deborah  Pt is well and stable, things  have improved with therapy.     HPI:   87 year old female with history of CKD, hypertension, hyperlipidemia who presented on 10/29/2020 with left visual disturbance, generalized weakness and a fall.  Personally reviewed hospitalization pertinent progress notes, lab work and imaging.  Evaluated by Dr. Erlinda Hong.  CT showed right PCA infarct.  MRI confirmed right PCA infarct, as well as right frontal chronic infarct.  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  EF 65 to 70%.  A1c 6.0, LDL 79.  Creatinine 2.19-> 2.00.  WBC 12.1.  Etiology for stroke likely embolic concerning for cardioembolic source especially occult A. fib and recommended 30-day cardiac event monitor for further evaluation.  Recommended DAPT for 3 weeks and aspirin alone as well as increased atorvastatin from 20 mg to 40mg  daily.  PT/OT recommended HH therapies and discharged home   Today, 03/18/2021, patient being seen for hospital follow up accompanied by her niece.   Overall doing well since d/c -denies new or worsening stroke/TIA symptoms Currently working with [facility] therapies for continued gait impairment, left sided weakness and cognitive impairment (worse from baseline) - does report ongoing improvement. Denies noticeable visual concerns/impairments. Underlying cognitive impairment slightly worsened post stroke.  Lives alone but does have aide supervision (safety concerns) during the day. Family checks on her frequently. They were  staying with her at night but this was stopped last week as no longer needed night time supervision.  Able to maintain ADLs independently.use of RW -denies any recent falls. Hopeful PT will release her from RW use in home this week.   Completed 3 weeks DAPT -remains on aspirin alone as well as atorvastatin -denies side effects Blood pressure today 123/67 - monitors at home typically 110-120s/60s Has f/u with PCP 2/1  Completed heart monitor - negative for atrial fibrillation - per cards eval, even if A fib found, would likely not be a good AC candidate due to age and fall risk  No further concerns at this time.      PERTINENT IMAGING  Per recent hospitalization CT head Acute right PCA distribution infarct, no hemorrhage or midline shift.  Small vessel disease. Atrophy.  MRI  Acute right occipital infarct with mild petechial hemorrhage. Small subacute left periatrial white matter infarct. Remote right frontal infarct. MRA No emergent finding or flow limiting stenosis of major vessels.  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 No pertinent surgical history.  Social History:  Social History   Socioeconomic History   Marital status: Single  Spouse name: Not on file   Number of children: Not on file   Years of education: Not on file   Highest education level: Not on file  Occupational History   Not on file  Tobacco Use   Smoking status: Never   Smokeless tobacco: Never  Vaping Use   Vaping Use: Never used  Substance and Sexual Activity   Alcohol use: Yes    Comment: rarely   Drug  use: Never   Sexual activity: Not on file  Other Topics Concern   Not on file  Social History Narrative   Not on file   Social Determinants of Health   Financial Resource Strain: Not on file  Food Insecurity: Not on file  Transportation Needs: Not on file  Physical Activity: Not on file  Stress: Not on file  Social Connections: Not on file  Intimate Partner Violence: Not on file    Family History:  Family History  Problem Relation Age of Onset   Cancer Mother    Dementia Sister    Heart disease Neg Hx     Medications:   Current Outpatient Medications on File Prior to Visit  Medication Sig Dispense Refill   allopurinol (ZYLOPRIM) 100 MG tablet Take 200 mg by mouth daily.     aspirin EC 81 MG EC tablet Take 1 tablet (81 mg total) by mouth daily. Swallow whole. 30 tablet 11   atorvastatin (LIPITOR) 40 MG tablet Take 1 tablet (40 mg total) by mouth daily. (Patient taking differently: Take 20 mg by mouth daily. Pt taking 20mg ) 30 tablet 0   furosemide (LASIX) 40 MG tablet Take 1 tablet (40 mg total) by mouth daily. 30 tablet    losartan (COZAAR) 50 MG tablet Take 1 tablet (50 mg total) by mouth daily.     pantoprazole (PROTONIX) 40 MG tablet Take 1 tablet (40 mg total) by mouth daily. 30 tablet 0   PAPAYA ENZYME PO Take 1 tablet by mouth daily as needed (upset stomach).     No current facility-administered medications on file prior to visit.    Allergies:  No Known Allergies    OBJECTIVE:  Physical Exam  Vitals:   03/18/21 1410  BP: 123/67  Pulse: 79  Weight: 96 lb (43.5 kg)  Height: 4\' 9"  (1.448 m)   Body mass index is 20.77 kg/m. No results found.  Post stroke PHQ 2/9 Depression screen PHQ 2/9 03/18/2021  Decreased Interest 0  Down, Depressed, Hopeless 0  PHQ - 2 Score 0     General: Frail petite very pleasant elderly African-American female, seated, in no evident distress Head: head normocephalic and atraumatic.   Neck: supple with no carotid or  supraclavicular bruits Cardiovascular: regular rate and rhythm, no murmurs Musculoskeletal: no deformity Skin:  no rash/petichiae Vascular:  Normal pulses all extremities   Neurologic Exam Mental Status: Awake and fully alert.  Fluent speech and language.  Oriented to place and time. Recent memory impaired and remote memory intact. Attention span, concentration and fund of knowledge impaired with niece providing majority of history. Mood and affect appropriate.  Recall 0/3 (w/ prompt 2/3). 4 legged animal naming 9 in 60 seconds. Some difficulty with serial addition. Cranial Nerves: Fundoscopic exam reveals sharp disc margins. Pupils equal, briskly reactive to light. Extraocular movements full without nystagmus. Visual fields left homonymous lower quadrantanopia. Hearing intact. Facial sensation intact. Face, tongue, palate moves normally and symmetrically.  Motor: Normal bulk and tone. Normal strength in all tested extremity muscles.  Unable to appreciate left sided weakness on exam Sensory.: intact to touch , pinprick , position and vibratory sensation.  Coordination: Rapid alternating movements normal in all extremities. Finger-to-nose and heel-to-shin performed accurately bilaterally. Gait and Station: Arises from chair without difficulty. Stance is normal. Gait demonstrates normal stride length with mild favoring of left leg and mild imbalance with turns with use of RW. Tandem walk and heel toe not attempted.  Reflexes: 1+ and symmetric. Toes downgoing.     NIHSS  1 Modified Rankin  3      ASSESSMENT: Angles Trevizo is a 87 y.o. year old female with recent right occipital stroke on 44/06/4740, embolic secondary to unclear source. Vascular risk factors include HTN, HLD, advanced age and prior strokes on imaging.      PLAN:  Right occipital stroke:  Residual deficit: left homonymous lower quadrantanopsia, gait impairment, subjective mild left-sided weakness and worsening of  baseline cognition.  Continue working with [facility] PT/OT/SLP. May consider additional outpatient therapies if indicated once [facility] completed.  Continued use of RW at all times unless otherwise instructed Cardiac monitor negative for atrial fibrillation - per recent cards eval due to fall risk, would not be a candidate for anticoagulation even if A. Fib found.  continue aspirin 81 mg daily  and atorvastatin 20 mg daily for secondary stroke prevention.   Discussed secondary stroke prevention measures and importance of close PCP follow up for aggressive stroke risk factor management. I have gone over the pathophysiology of stroke, warning signs and symptoms, risk factors and their management in some detail with instructions to go to the closest emergency room for symptoms of concern. HTN: BP goal <130/90.  Stable on current regimen per PCP HLD: LDL goal <70. Recent LDL 79.  Continue atorvastatin 20 mg daily. Plans on repeat lipid panel with PCP at next visit Cerebral aneurysm: per recent MRA, 3.71mm L supraclinoid ICA aneurysm. Due to age, not a candidate for any type of surgical intervention if indicated in the future therefore would be of no benefit for serial imaging/monitoring    Follow up in 4 months or call earlier if needed   CC:  [facility] provider: Dr. Leonie Man PCP: Charlane Ferretti, MD    I spent 57 minutes of face-to-face and non-face-to-face time with patient and niece.  This included previsit chart review including review of recent hospitalization, lab review, study review, order entry, electronic health record documentation, patient and niece education and discussion regarding recent stroke including possible etiologies, secondary stroke prevention measures and importance of managing stroke risk factors, residual deficits and typical recovery time and answered all other questions to patient and nieces satisfaction  Frann Rider, AGNP-BC  [facility] [address] [place], [place] [address]  Phone [phone] Fax [phone] Note: This document was prepared with digital dictation and possible [name] phrase technology. Any transcriptional errors that result from this process are unintentional.

## 2021-03-18 NOTE — Patient Instructions (Addendum)
Continue working with therapies for hopeful ongoing recovery - if additional therapy is needed, we can consider doing outpatient therapy if needed  Continue aspirin 81 mg daily  and atorvastatin 20mg  daily  for secondary stroke prevention  Continue to follow up with PCP regarding cholesterol and blood pressure management  Maintain strict control of hypertension with blood pressure goal below 130/90 and cholesterol with LDL cholesterol (bad cholesterol) goal below 70 mg/dL.   Signs of a Stroke? Follow the BEFAST method:  Balance Watch for a sudden loss of balance, trouble with coordination or vertigo Eyes Is there a sudden loss of vision in one or both eyes? Or double vision?  Face: Ask the person to smile. Does one side of the face droop or is it numb?  Arms: Ask the person to raise both arms. Does one arm drift downward? Is there weakness or numbness of a leg? Speech: Ask the person to repeat a simple phrase. Does the speech sound slurred/strange? Is the person confused ? Time: If you observe any of these signs, call 911.     Followup in the future with me in 4 months or call earlier if needed       Thank you for coming to see [place] at [facility]. I hope we have been able to provide you high quality care today.  You may receive a patient satisfaction survey over the next few weeks. We would appreciate your feedback and comments so that we may continue to improve ourselves and the health of our patients.

## 2021-03-21 ENCOUNTER — Other Ambulatory Visit: Payer: Medicare Other | Admitting: Hospice

## 2021-03-21 ENCOUNTER — Other Ambulatory Visit: Payer: Self-pay

## 2021-03-21 ENCOUNTER — Other Ambulatory Visit: Payer: Self-pay | Admitting: Physician Assistant

## 2021-03-21 DIAGNOSIS — I639 Cerebral infarction, unspecified: Secondary | ICD-10-CM

## 2021-03-21 DIAGNOSIS — I4891 Unspecified atrial fibrillation: Secondary | ICD-10-CM

## 2021-03-21 DIAGNOSIS — R531 Weakness: Secondary | ICD-10-CM

## 2021-03-21 DIAGNOSIS — I63532 Cerebral infarction due to unspecified occlusion or stenosis of left posterior cerebral artery: Secondary | ICD-10-CM

## 2021-03-21 DIAGNOSIS — E440 Moderate protein-calorie malnutrition: Secondary | ICD-10-CM

## 2021-03-21 DIAGNOSIS — Z515 Encounter for palliative care: Secondary | ICD-10-CM

## 2021-03-21 NOTE — Progress Notes (Signed)
° ° °[facility] °Community Palliative Care Consult Note °Telephone: [phone]  °Fax: [phone] ° °PATIENT NAME: Tracy [name] °[address] °[place] [address] °[phone] (home)  °DOB: 06/07/1919 °MRN: [id number] ° °PRIMARY CARE PROVIDER:    °Dr. Sneha Raju ° °REFERRING PROVIDER:   °Dr. Sneha Raju °[facility] ° °RESPONSIBLE PARTY:  Deborah HCPOA °Stephen is Deborah' s spouse °Contact Information   ° ° Name Relation Home Work Mobile  ° McClendon,Deborah Niece [phone]  [phone]  ° McClendon,Steven Relative [phone]  [phone]  ° °  °  ° ° Visit with patient and family at home. Palliative Care was asked to follow this patient by consultation request of  Dr. Sneha Raju to address advance care planning, complex medical decision making and goals of care clarification. Linda - caregiver is with patient during visit. This is a follow up visit. ° °  ASSESSMENT AND / RECOMMENDATIONS:  ° °CODE STATUS: Discussion on ramifications and implications of code status. Patient affirmed she is a Do Not Resuscitate.   ° °Goals of Care: Goals include to maximize quality of life and symptom management °Symptom Management/Plan: °Protein Caloric Malnutrition: Moderate. Continue to offer small meals of choice 4-6 times a day. Ensure BID. Provide help during meals to ensure adequate oral intake. Initiate Mirtazapine 7.5mg to boost appetite if there if there is further weight loss.  °Weakness on the right side  related to recent CVA is  improving. Physical and occupational therapy is ongoing. Continue post stroke care. Follow up with Cardiologist as planned and with PCP as scheduled. °Routine CBC CMP °Weakness: OT/PT is ongoing for strengthening. Patient is a Fall risk. Patient uses a rolling walker . Falls precautions reiterated with patient and caregiver.  °HTN: Managed with Losartan.  °Follow up: Palliative care will continue to follow for complex medical decision making, advance  care planning, and clarification of goals. Return 3 months or prn.Encouraged to call provider sooner with any concerns.  ° °Family /Caregiver/Community Supports: Patient lives at home at home alone; has day time caregiver on a daily basis. Debra manages her medications. Strong family support system identified.  ° °HOSPICE ELIGIBILITY/DIAGNOSIS: TBD ° °Chief Complaint: Follow up visit ° °HISTORY OF PRESENT ILLNESS:  Tracy [name] is a 87 y.o. year old female  with multiple medical conditions including protein caloric malnutrition which worsened in the last 2 months following acute stroke due to occlusion of left posterior cerebral artery for which patient was hospitalized 10/25-10/28/2022, treated and discharged home with Home PT/OT. She endorses weakness. She said her appetite is picking up and she eats greater than 75% of her meals. She denies difficulty swallowing, no coughing during meals. Patient has right sided weakness related to the stroke for PT/OT is helpful. History of hyperlipidemia, hypertension, chronic kidney disease stage IV and anemia of chronic .  °History obtained from review of EMR, discussion with primary team, caregiver, family and/or Ms. Shepler.  °Review and summarization of Epic records shows history from other than patient. Rest of 10 point ROS asked and negative.  °I reviewed, as needed, available labs, patient records, imaging, studies and related documents from the EMR. ° °ROS °General: NAD °ENMT: denies dysphagia °Cardiovascular: denies chest pain/discomfort °Pulmonary: denies cough, denies SOB °Abdomen: endorses good appetite, denies constipation/diarrhea °GU: denies dysuria, urinary frequency °MSK:  endorses weakness, right side weakness, no falls reported °Skin: denies rashes or wounds °Neurological: denies pain, denies insomnia °Psych: Endorses positive mood °Heme/lymph/immuno: denies bruises, abnormal bleeding ° °Physical Exam: °Height/weight:   4 feet 9 inches/ 96 Ibs BMI 20.77   °Constitutional: NAD °General: Well groomed, cooperative °EYES: anicteric sclera, lids intact, no discharge  °ENMT: Moist mucous membrane °CV: S1 S2, RRR, no LE edema °Pulmonary: LCTA, no increased work of breathing, no cough, °Abdomen: active BS + 4 quadrants, soft and non tender °GU: no suprapubic tenderness °MSK: weakness, sarcopenia, limited ROM °Skin: warm and dry, no rashes or wounds on visible skin °Neuro:  weakness, otherwise non focal °Psych: non-anxious affect °Hem/lymph/immuno: no widespread bruising °Most Recent lab °Sodium: 136 02/01/21 03:46 °Potassium: 3.8 02/01/21 03:46 °Chloride: 104 02/01/21 03:46 °CO2: 24 02/01/21 03:46 °Glucose: 128 (H) 02/01/21 03:46 °Mean Plasma Glucose: 125.5 01/30/21 03:17 °BUN: 21 02/01/21 03:46 °Creatinine: 1.92 (H) 02/01/21 03:46 °Calcium: 8.8 (L) 02/01/21 03:46 °Anion gap: 8 02/01/21 03:46 °Magnesium: 2.1 02/01/21 03:46 °Alkaline Phosphatase: 68 02/01/21 03:46 °Albumin: 3.2 (L) 02/01/21 03:46 °AST: 35 02/01/21 03:46 °ALT: 11 02/01/21 03:46 °Total Protein: 6.3 (L) 02/01/21 03:46 °Total Bilirubin: 0.7 02/01/21 03:46 °GFR, Estimated: 23 (L) 02/01/21 03:46 °GFR, Est Non African American: 26 (L) 02/27/09 22:41 °GFR, Est African American: 31        °The eGFR has been calculated °using the MDRD equation. °This calculation has not been °validated in all clinical °situations. °eGFR's persistently °<60 mL/min signify °possible Chronic Kidney Disease. (L) 02/27/09 22:41 ° ° °(H): Data is abnormally high °(L): Data is abnormally low ° °PAST MEDICAL HISTORY:  °Active Ambulatory Problems  °  Diagnosis Date Noted  ° Acute stroke due to occlusion of left posterior cerebral artery (HCC) 01/30/2021  ° Essential hypertension 01/30/2021  ° Anemia due to stage 4 chronic kidney disease (HCC) 01/30/2021  ° Chronic kidney disease, stage 4 (severe) (HCC) 01/30/2021  ° Mixed hyperlipidemia 01/30/2021  ° °Resolved Ambulatory Problems  °  Diagnosis Date Noted  ° No Resolved Ambulatory Problems   ° °No Additional Past Medical History  ° ° °SOCIAL HX:  °Social History  ° °Tobacco Use  ° Smoking status: Never  ° Smokeless tobacco: Never  °Substance Use Topics  ° Alcohol use: Yes  °  Comment: rarely  ° °  °FAMILY HX:  °Family History  °Problem Relation Age of Onset  ° Cancer Mother   ° Dementia Sister   ° Heart disease Neg Hx   °   ° °ALLERGIES: No Known Allergies   ° °PERTINENT MEDICATIONS:  °Outpatient Encounter Medications as of 03/21/2021  °Medication Sig  ° allopurinol (ZYLOPRIM) 100 MG tablet Take 200 mg by mouth daily.  ° aspirin EC 81 MG EC tablet Take 1 tablet (81 mg total) by mouth daily. Swallow whole.  ° atorvastatin (LIPITOR) 40 MG tablet Take 1 tablet (40 mg total) by mouth daily. (Patient taking differently: Take 20 mg by mouth daily. Pt taking 20mg)  ° furosemide (LASIX) 40 MG tablet Take 1 tablet (40 mg total) by mouth daily.  ° losartan (COZAAR) 50 MG tablet Take 1 tablet (50 mg total) by mouth daily.  ° pantoprazole (PROTONIX) 40 MG tablet Take 1 tablet (40 mg total) by mouth daily.  ° PAPAYA ENZYME PO Take 1 tablet by mouth daily as needed (upset stomach).  ° °No facility-administered encounter medications on file as of 03/21/2021.  ° °I spent 60 minutes providing this consultation; this includes time spent with patient/family, chart review and documentation. More than 50% of the time in this consultation was spent on counseling and coordinating communication  ° °Thank you for the opportunity to participate in the care of Ms. Calais.    Robina Ade.  The palliative care team will continue to follow. Please call our office at [phone] if we can be of additional assistance.   Note: Portions of this note were generated with [employer]. Dictation errors may occur despite best attempts at proofreading.  Teodoro Spray, NP

## 2021-05-17 ENCOUNTER — Ambulatory Visit: Payer: Medicare PPO | Admitting: Podiatrist

## 2021-05-17 ENCOUNTER — Other Ambulatory Visit: Payer: Self-pay

## 2021-05-17 ENCOUNTER — Encounter: Payer: Self-pay | Admitting: Podiatrist

## 2021-05-17 DIAGNOSIS — B351 Tinea unguium: Secondary | ICD-10-CM

## 2021-05-17 DIAGNOSIS — L603 Nail dystrophy: Secondary | ICD-10-CM

## 2021-05-17 DIAGNOSIS — M79609 Pain in unspecified limb: Secondary | ICD-10-CM

## 2021-05-17 DIAGNOSIS — L608 Other nail disorders: Secondary | ICD-10-CM

## 2021-05-17 NOTE — Patient Instructions (Signed)

## 2021-05-20 NOTE — Progress Notes (Signed)
Subjective: Tracy [name] is a 88 y.o. female patient who presents to office today complaining of long,mildly painful nails  while ambulating in shoes; unable to trim. Patient denies any new changes in medication or new problems. Patient denies any new cramping, numbness, burning or tingling in the legs.  Patient Active Problem List   Diagnosis Date Noted   Acute stroke due to occlusion of left posterior cerebral artery ([place]) 01/30/2021   Essential hypertension 01/30/2021   Anemia due to stage 4 chronic kidney disease ([place]) 01/30/2021   Chronic kidney disease, stage 4 (severe) ([place]) 01/30/2021   Mixed hyperlipidemia 01/30/2021   Current Outpatient Medications on File Prior to Visit  Medication Sig Dispense Refill   aspirin (ASPIRIN ADULT LOW DOSE) 81 MG EC tablet 1 tablet     allopurinol (ZYLOPRIM) 100 MG tablet 2 tablets     atorvastatin (LIPITOR) 20 MG tablet 1 tablet     furosemide (LASIX) 40 MG tablet Take 1 tablet (40 mg total) by mouth daily. 30 tablet    losartan (COZAAR) 50 MG tablet 1 tablet     pantoprazole (PROTONIX) 40 MG tablet 1 tablet     PAPAYA ENZYME PO      No current facility-administered medications on file prior to visit.   No Known Allergies  No results found for this or any previous visit (from the past 2160 hour(s)).  Objective: General: Patient is awake, alert, and oriented x 3 and in no acute distress.  Integument: Skin is warm, dry and supple bilateral. Nails are tender, long, thickened and dystrophic with subungual debris, consistent with onychomycosis, 1-5 bilateral. Left great toenail is incurvated against her nail bed.  No signs of infection. No open lesions or preulcerative lesions present bilateral. Remaining integument unremarkable.  Vasculature:  Dorsalis Pedis pulse 1/4 bilateral. Posterior Tibial pulse  1/4 bilateral.  Capillary fill time <3 sec 1-5 bilateral. Temperature gradient within normal limits. No varicosities present bilateral. No edema  present bilateral.   Neurology: The patient has intact sensation measured with a 5.07/10g Semmes Weinstein Monofilament at all pedal sites bilateral . Vibratory sensation diminished bilateral with tuning fork. No Babinski sign present bilateral.   Musculoskeletal: No symptomatic pedal deformities noted bilateral. Muscular strength 5/5 in all lower extremity muscular groups bilateral without pain on range of motion . No tenderness with calf compression bilateral.  Assessment and Plan:   ICD-10-CM   1. Pain due to onychomycosis of nail  B35.1    M79.609     2. Incurvated nail  L60.8           -Examined patient. -Mechanically debrided all nails 1-5 bilateral using sterile nail nipper and filed with dremel without incident  -Answered all patient questions -Patient to return  in 3 months for routine care -Patient advised to call the office if any problems or questions arise in the meantime.

## 2021-07-17 ENCOUNTER — Ambulatory Visit: Payer: Medicare Other | Admitting: Adult Health

## 2021-07-29 ENCOUNTER — Ambulatory Visit: Payer: Medicare PPO | Admitting: Adult Health

## 2021-07-29 ENCOUNTER — Encounter: Payer: Self-pay | Admitting: Adult Health

## 2021-07-29 VITALS — BP 156/73 | HR 77 | Ht <= 58 in | Wt 95.8 lb

## 2021-07-29 DIAGNOSIS — F01B3 Vascular dementia, moderate, with mood disturbance: Secondary | ICD-10-CM | POA: Diagnosis not present

## 2021-07-29 DIAGNOSIS — Z8673 Personal history of transient ischemic attack (TIA), and cerebral infarction without residual deficits: Secondary | ICD-10-CM | POA: Diagnosis not present

## 2021-07-29 MED ORDER — MEMANTINE HCL 28 X 5 MG & 21 X 10 MG PO TABS: ORAL_TABLET | ORAL | 0 refills | Status: DC

## 2021-07-29 MED ORDER — MEMANTINE HCL 10 MG PO TABS: 10.0000 mg | ORAL_TABLET | Freq: Two times a day (BID) | ORAL | 5 refills | Status: DC

## 2021-07-29 NOTE — Progress Notes (Signed)
?[facility] ?[address] ?[place]. [place] [address] ?[phone] ? ?     STROKE FOLLOW UP NOTE ? ?[name] ?Date of Birth:  November 05, 1919 ?Medical Record Number:  [id number]  ? ?Reason for Referral: stroke follow up ? ? ? ?SUBJECTIVE: ? ? ?CHIEF COMPLAINT:  ?Chief Complaint  ?Patient presents with  ? Occipital stroke  ?  Rm 2, 4 month FU  great nieceAnderson [name], "I have no appetite, what can I do?"MMSE 20  ? ? ?HPI:  ? ?Update 07/29/2021 JM: Patient returns for 74-month stroke follow-up accompanied by her great niece, Tracy [name] and niece Tracy [name] via telephone.  Overall stable from stroke standpoint without new stroke/TIA symptoms. Niece does mention gradual decline of memory since her stroke increase agitation especially with caregivers.  Occasional hallucinations (will 2 people in room but only 1 present, denies any other objects doubled). Sleeps well.  Occasional daytime naps. Poor appetite - will get agitated with caregivers when trying to encourage her to eat more but during visit, pt voices frustration regarding difficulty gaining weight.  Continues to live in own home, has daytime caregivers and alone at night, family checks on her frequently and assists as needed.  Able to maintain ADLs independently. Ambulates with  RW when out doors, does not always need indoors, denies any recent falls.  Compliant on aspirin and atorvastatin, denies side effects.  Blood pressure today slightly elevated. At home, has been 120-150s, did stop losartan previously due to hypotension but recently restarted as blood pressure levels gradually increasing.  Routinely followed by PCP with routine lab work which has been satisfactory per patient (unable to view via epic).  No further concerns at this time.  ? ? ? ?History provided for reference purposes only ?Initial visit 03/18/2021 JM: Patient being seen for hospital follow up accompanied by her niece.  ? ?Overall doing well since d/c -denies new or  worsening stroke/TIA symptoms ?Currently working with [facility] therapies for continued gait impairment, left sided weakness and cognitive impairment (worse from baseline) - does report ongoing improvement. Denies noticeable visual concerns/impairments. Underlying cognitive impairment slightly worsened post stroke.  Lives alone but does have aide supervision (safety concerns) during the day. Family checks on her frequently. They were staying with her at night but this was stopped last week as no longer needed night time supervision.  Able to maintain ADLs independently.use of RW -denies any recent falls. Hopeful PT will release her from RW use in home this week.  ? ?Completed 3 weeks DAPT -remains on aspirin alone as well as atorvastatin -denies side effects ?Blood pressure today 123/67 - monitors at home typically 110-120s/60s ?Has f/u with PCP 2/1 ? ?Completed heart monitor - negative for atrial fibrillation - per cards eval, even if A fib found, would likely not be a good AC candidate due to age and fall risk ? ?No further concerns at this time. ? ?Stroke admission 10/29/2020 ?88 year old female with history of CKD, hypertension, hyperlipidemia who presented on 10/29/2020 with left visual disturbance, generalized weakness and a fall.  Personally reviewed hospitalization pertinent progress notes, lab work and imaging.  Evaluated by Dr. Erlinda Hong.  CT showed right PCA infarct.  MRI confirmed right PCA infarct, as well as right frontal chronic infarct.  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  EF 65 to 70%.  A1c 6.0, LDL 79.  Creatinine 2.19-> 2.00.  WBC 12.1.  Etiology  for stroke likely embolic concerning for cardioembolic source especially occult A. fib and recommended 30-day cardiac event monitor for further evaluation.  Recommended DAPT for 3 weeks and aspirin alone as well as increased atorvastatin from 20 mg to 40mg   daily.  PT/OT recommended HH therapies and discharged home ? ? ? ? ? ? ? ?PERTINENT IMAGING ? ?Per recent hospitalization ?CT head Acute right PCA distribution infarct, no hemorrhage or midline shift.  Small vessel disease. Atrophy.  ?MRI  Acute right occipital infarct with mild petechial hemorrhage. Small subacute left periatrial white matter infarct. Remote right frontal infarct. ?MRA No emergent finding or flow limiting stenosis of major vessels.  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 ? ? ? ? ? ?ROS:   ?14 system review of systems performed and negative with exception of those listed in HPI ? ?PMH:  ?Past Medical History:  ?Diagnosis Date  ? Anemia due to stage 4 chronic kidney disease ([place]) 01/30/2021  ? Chronic kidney disease, stage 4 (severe) ([place]) 01/30/2021  ? Essential hypertension 01/30/2021  ? Mixed hyperlipidemia 01/30/2021  ? Occipital stroke ([place]) 2022  ? ? ?PSH: History reviewed. No pertinent surgical history. ? ?Social History:  ?Social History  ? ?Socioeconomic History  ? Marital status: Single  ?  Spouse name: Not on file  ? Number of children: Not on file  ? Years of education: Not on file  ? Highest education level: Not on file  ?Occupational History  ? Not on file  ?Tobacco Use  ? Smoking status: Never  ? Smokeless tobacco: Never  ?Vaping Use  ? Vaping Use: Never used  ?Substance and Sexual Activity  ? Alcohol use: Yes  ?  Comment: rarely  ? Drug use: Never  ? Sexual activity: Not on file  ?Other Topics Concern  ? Not on file  ?Social History Narrative  ? Not on file  ? ?Social Determinants of Health  ? ?Financial Resource Strain: Not on file  ?Food Insecurity: Not on file  ?Transportation Needs: Not on file  ?Physical Activity: Not on file  ?Stress: Not on file  ?Social Connections: Not on file  ?Intimate Partner Violence: Not on file  ? ? ?Family History:  ?Family History  ?Problem Relation Age of Onset  ? Cancer Mother   ? Dementia Sister   ? Heart disease Neg Hx    ? ? ?Medications:   ?Current Outpatient Medications on File Prior to Visit  ?Medication Sig Dispense Refill  ? allopurinol (ZYLOPRIM) 100 MG tablet 2 tablets    ? aspirin 81 MG EC tablet 1 tablet    ? atorvastatin (LIPITOR) 20 MG tablet 1 tablet    ? furosemide (LASIX) 40 MG tablet Take 1 tablet (40 mg total) by mouth daily. 30 tablet   ? losartan (COZAAR) 50 MG tablet 1 tablet    ? pantoprazole (PROTONIX) 40 MG tablet 1 tablet    ? PAPAYA ENZYME PO     ? ?No current facility-administered medications on file prior to visit.  ? ? ?Allergies:  No Known Allergies ? ? ? ?OBJECTIVE: ? ?Physical Exam ? ?Vitals:  ? 07/29/21 1450  ?BP: (!) 156/73  ?Pulse: 77  ?Weight: 95 lb 12.8 oz (43.5 kg)  ?Height: 4\' 9"  (1.448 m)  ? ?Body mass index is 20.73 kg/m?Marland Kitchen ?No results found. ? ?General: Frail petite very pleasant elderly African-American female, seated, in no evident distress ?Head: head normocephalic and atraumatic.   ?Neck: supple with  no carotid or supraclavicular bruits ?Cardiovascular: regular rate and rhythm, no murmurs ?Musculoskeletal: no deformity ?Skin:  no rash/petichiae ?Vascular:  Normal pulses all extremities ?  ?Neurologic Exam ?Mental Status: Awake and fully alert.  Fluent speech and language.  Oriented to place and time. Recent memory impaired and remote memory intact. Attention span, concentration and fund of knowledge impaired with niece providing majority of history. Mood and affect appropriate.   ? ?  07/29/2021  ?  3:44 PM  ?MMSE - Mini Mental State Exam  ?Orientation to time 3  ?Orientation to Place 4  ?Registration 3  ?Attention/ Calculation 3  ?Recall 0  ?Language- name 2 objects 2  ?Language- repeat 0  ?Language- follow 3 step command 3  ?Language- read & follow direction 1  ?Write a sentence 1  ?Copy design 0  ?Total score 20  ? ?Cranial Nerves: Pupils equal, briskly reactive to light. Extraocular movements full without nystagmus. Visual fields left homonymous lower quadrantanopia. Hearing intact.  Facial sensation intact. Face, tongue, palate moves normally and symmetrically.  ?Motor: Normal bulk and tone. Normal strength in all tested extremity muscles. Unable to appreciate left sided weakness on exam ?Sensory.

## 2021-07-29 NOTE — Patient Instructions (Addendum)
Continue aspirin 81 mg daily  and atorvastatin for secondary stroke prevention ? ?Ensure you follow-up with your PCP Dr. Louis Matte regarding concerns with decreased appetite ? 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 Would recommend starting Namenda titration pack - if too expensive, please let me know. Once titration pack completed, therapeutic dose should be at 10mg  twice daily. An additional prescription will be sent to your pharmacy to start after completion of titration pack ? ?Continue to follow up with PCP regarding cholesterol and blood pressure management  ?Maintain strict control of hypertension with blood pressure goal below 130/90 and cholesterol with LDL cholesterol (bad cholesterol) goal below 70 mg/dL.  ? ?Signs of a Stroke? Follow the BEFAST method:  ?Balance Watch for a sudden loss of balance, trouble with coordination or vertigo ?Eyes Is there a sudden loss of vision in one or both eyes? Or double vision?  ?Face: Ask the person to smile. Does one side of the face droop or is it numb?  ?Arms: Ask the person to raise both arms. Does one arm drift downward? Is there weakness or numbness of a leg? ?Speech: Ask the person to repeat a simple phrase. Does the speech sound slurred/strange? Is the person confused ? ?Time: If you observe any of these signs, call 911. ? ? ? ?Follow up in 6 months or call earlier if needed ? ? ? ? ?Thank you for coming to see [place] at [facility]. I hope we have been able to provide you high quality care today. ? ?You may receive a patient satisfaction survey over the next few weeks. We would appreciate your feedback and comments so that we may continue to improve ourselves and the health of our patients. ? ?

## 2021-07-29 NOTE — Addendum Note (Signed)
Addended by: Frann Rider L on: 07/29/2021 04:18 PM ? ? Modules accepted: Orders ? ?

## 2021-08-25 ENCOUNTER — Other Ambulatory Visit: Payer: Self-pay | Admitting: Adult Health

## 2021-09-13 ENCOUNTER — Encounter: Payer: Self-pay | Admitting: Podiatry

## 2021-09-13 ENCOUNTER — Ambulatory Visit: Payer: Medicare PPO | Admitting: Podiatry

## 2021-09-13 DIAGNOSIS — N184 Chronic kidney disease, stage 4 (severe): Secondary | ICD-10-CM

## 2021-09-13 DIAGNOSIS — B351 Tinea unguium: Secondary | ICD-10-CM

## 2021-09-13 DIAGNOSIS — L608 Other nail disorders: Secondary | ICD-10-CM

## 2021-09-13 DIAGNOSIS — M79609 Pain in unspecified limb: Secondary | ICD-10-CM

## 2021-09-13 NOTE — Progress Notes (Signed)
This patient returns to my office for at risk foot care.  This patient requires this care by a professional since this patient will be at risk due to having CKD.  This patient is unable to cut nails herself since the patient cannot reach her nails.These nails are painful walking and wearing shoes.  This patient presents for at risk foot care today. Patient presents to office with her daughter.  General Appearance  Alert, conversant and in no acute stress.  Vascular  Dorsalis pedis and posterior tibial  pulses are  weakly palpable  bilaterally.  Capillary return is within normal limits  bilaterally. Cold feet. bilaterally.  Neurologic  Senn-Weinstein monofilament wire test within normal limits  bilaterally. Muscle power within normal limits bilaterally.  Nails Thick disfigured discolored nails with subungual debris  from hallux to fifth toes bilaterally. No evidence of bacterial infection or drainage bilaterally.  Orthopedic  No limitations of motion  feet .  No crepitus or effusions noted.  No bony pathology or digital deformities noted.  Skin  normotropic skin with no porokeratosis noted bilaterally.  No signs of infections or ulcers noted.     Onychomycosis  Pain in right toes  Pain in left toes  Consent was obtained for treatment procedures.   Mechanical debridement of nails 1-5  bilaterally performed with a nail nipper.  Filed with dremel without incident.    Return office visit   4 months                   Told patient to return for periodic foot care and evaluation due to potential at risk complications.   Gardiner Barefoot DPM

## 2021-09-15 ENCOUNTER — Other Ambulatory Visit: Payer: Self-pay

## 2021-09-15 ENCOUNTER — Encounter (HOSPITAL_COMMUNITY): Payer: Self-pay

## 2021-09-15 ENCOUNTER — Emergency Department (HOSPITAL_COMMUNITY): Payer: Medicare PPO

## 2021-09-15 ENCOUNTER — Inpatient Hospital Stay (HOSPITAL_COMMUNITY)
Admission: EM | Admit: 2021-09-15 | Discharge: 2021-09-24 | DRG: 521 | Disposition: A | Discharge status: Skilled Nursing Facility | Payer: Medicare PPO | Attending: Family Medicine | Admitting: Family Medicine

## 2021-09-15 DIAGNOSIS — I6340 Cerebral infarction due to embolism of unspecified cerebral artery: Secondary | ICD-10-CM | POA: Diagnosis not present

## 2021-09-15 DIAGNOSIS — I10 Essential (primary) hypertension: Secondary | ICD-10-CM | POA: Diagnosis present

## 2021-09-15 DIAGNOSIS — M2550 Pain in unspecified joint: Secondary | ICD-10-CM | POA: Diagnosis not present

## 2021-09-15 DIAGNOSIS — I6389 Other cerebral infarction: Secondary | ICD-10-CM | POA: Diagnosis not present

## 2021-09-15 DIAGNOSIS — F0150 Vascular dementia without behavioral disturbance: Secondary | ICD-10-CM | POA: Diagnosis present

## 2021-09-15 DIAGNOSIS — I82412 Acute embolism and thrombosis of left femoral vein: Secondary | ICD-10-CM | POA: Diagnosis not present

## 2021-09-15 DIAGNOSIS — N189 Chronic kidney disease, unspecified: Secondary | ICD-10-CM | POA: Diagnosis present

## 2021-09-15 DIAGNOSIS — R4189 Other symptoms and signs involving cognitive functions and awareness: Secondary | ICD-10-CM | POA: Diagnosis not present

## 2021-09-15 DIAGNOSIS — E782 Mixed hyperlipidemia: Secondary | ICD-10-CM | POA: Diagnosis present

## 2021-09-15 DIAGNOSIS — R569 Unspecified convulsions: Secondary | ICD-10-CM | POA: Diagnosis not present

## 2021-09-15 DIAGNOSIS — I129 Hypertensive chronic kidney disease with stage 1 through stage 4 chronic kidney disease, or unspecified chronic kidney disease: Secondary | ICD-10-CM | POA: Diagnosis present

## 2021-09-15 DIAGNOSIS — H53462 Homonymous bilateral field defects, left side: Secondary | ICD-10-CM | POA: Diagnosis not present

## 2021-09-15 DIAGNOSIS — Z01818 Encounter for other preprocedural examination: Secondary | ICD-10-CM | POA: Diagnosis not present

## 2021-09-15 DIAGNOSIS — N19 Unspecified kidney failure: Secondary | ICD-10-CM | POA: Diagnosis not present

## 2021-09-15 DIAGNOSIS — M8000XA Age-related osteoporosis with current pathological fracture, unspecified site, initial encounter for fracture: Secondary | ICD-10-CM | POA: Diagnosis not present

## 2021-09-15 DIAGNOSIS — I63331 Cerebral infarction due to thrombosis of right posterior cerebral artery: Secondary | ICD-10-CM | POA: Diagnosis not present

## 2021-09-15 DIAGNOSIS — I639 Cerebral infarction, unspecified: Secondary | ICD-10-CM

## 2021-09-15 DIAGNOSIS — Z9889 Other specified postprocedural states: Secondary | ICD-10-CM | POA: Diagnosis not present

## 2021-09-15 DIAGNOSIS — J9811 Atelectasis: Secondary | ICD-10-CM | POA: Diagnosis not present

## 2021-09-15 DIAGNOSIS — Z9181 History of falling: Secondary | ICD-10-CM | POA: Diagnosis not present

## 2021-09-15 DIAGNOSIS — R5381 Other malaise: Secondary | ICD-10-CM | POA: Diagnosis not present

## 2021-09-15 DIAGNOSIS — K219 Gastro-esophageal reflux disease without esophagitis: Secondary | ICD-10-CM | POA: Diagnosis present

## 2021-09-15 DIAGNOSIS — Z4789 Encounter for other orthopedic aftercare: Secondary | ICD-10-CM | POA: Diagnosis not present

## 2021-09-15 DIAGNOSIS — M80052A Age-related osteoporosis with current pathological fracture, left femur, initial encounter for fracture: Principal | ICD-10-CM | POA: Diagnosis present

## 2021-09-15 DIAGNOSIS — I82492 Acute embolism and thrombosis of other specified deep vein of left lower extremity: Secondary | ICD-10-CM | POA: Diagnosis not present

## 2021-09-15 DIAGNOSIS — G253 Myoclonus: Secondary | ICD-10-CM | POA: Diagnosis not present

## 2021-09-15 DIAGNOSIS — M109 Gout, unspecified: Secondary | ICD-10-CM | POA: Diagnosis present

## 2021-09-15 DIAGNOSIS — W19XXXD Unspecified fall, subsequent encounter: Secondary | ICD-10-CM | POA: Diagnosis not present

## 2021-09-15 DIAGNOSIS — Z8673 Personal history of transient ischemic attack (TIA), and cerebral infarction without residual deficits: Secondary | ICD-10-CM

## 2021-09-15 DIAGNOSIS — R29898 Other symptoms and signs involving the musculoskeletal system: Secondary | ICD-10-CM | POA: Diagnosis not present

## 2021-09-15 DIAGNOSIS — M1120 Other chondrocalcinosis, unspecified site: Secondary | ICD-10-CM | POA: Diagnosis not present

## 2021-09-15 DIAGNOSIS — F05 Delirium due to known physiological condition: Secondary | ICD-10-CM | POA: Diagnosis not present

## 2021-09-15 DIAGNOSIS — I6782 Cerebral ischemia: Secondary | ICD-10-CM | POA: Diagnosis not present

## 2021-09-15 DIAGNOSIS — Z7401 Bed confinement status: Secondary | ICD-10-CM | POA: Diagnosis not present

## 2021-09-15 DIAGNOSIS — M810 Age-related osteoporosis without current pathological fracture: Secondary | ICD-10-CM | POA: Diagnosis present

## 2021-09-15 DIAGNOSIS — S72002A Fracture of unspecified part of neck of left femur, initial encounter for closed fracture: Secondary | ICD-10-CM

## 2021-09-15 DIAGNOSIS — R0902 Hypoxemia: Secondary | ICD-10-CM | POA: Diagnosis present

## 2021-09-15 DIAGNOSIS — Z471 Aftercare following joint replacement surgery: Secondary | ICD-10-CM | POA: Diagnosis not present

## 2021-09-15 DIAGNOSIS — R609 Edema, unspecified: Secondary | ICD-10-CM | POA: Diagnosis not present

## 2021-09-15 DIAGNOSIS — S7290XA Unspecified fracture of unspecified femur, initial encounter for closed fracture: Secondary | ICD-10-CM | POA: Diagnosis present

## 2021-09-15 DIAGNOSIS — Z96642 Presence of left artificial hip joint: Secondary | ICD-10-CM | POA: Diagnosis not present

## 2021-09-15 DIAGNOSIS — Z79899 Other long term (current) drug therapy: Secondary | ICD-10-CM | POA: Diagnosis not present

## 2021-09-15 DIAGNOSIS — N184 Chronic kidney disease, stage 4 (severe): Secondary | ICD-10-CM | POA: Diagnosis not present

## 2021-09-15 DIAGNOSIS — D631 Anemia in chronic kidney disease: Secondary | ICD-10-CM | POA: Diagnosis not present

## 2021-09-15 DIAGNOSIS — Z66 Do not resuscitate: Secondary | ICD-10-CM | POA: Diagnosis not present

## 2021-09-15 DIAGNOSIS — S72012A Unspecified intracapsular fracture of left femur, initial encounter for closed fracture: Secondary | ICD-10-CM | POA: Diagnosis not present

## 2021-09-15 DIAGNOSIS — Z91148 Patient's other noncompliance with medication regimen for other reason: Secondary | ICD-10-CM | POA: Diagnosis not present

## 2021-09-15 DIAGNOSIS — G928 Other toxic encephalopathy: Secondary | ICD-10-CM | POA: Diagnosis present

## 2021-09-15 DIAGNOSIS — W19XXXA Unspecified fall, initial encounter: Secondary | ICD-10-CM | POA: Diagnosis not present

## 2021-09-15 DIAGNOSIS — Z7982 Long term (current) use of aspirin: Secondary | ICD-10-CM

## 2021-09-15 DIAGNOSIS — S3993XA Unspecified injury of pelvis, initial encounter: Secondary | ICD-10-CM | POA: Diagnosis not present

## 2021-09-15 DIAGNOSIS — F01B0 Vascular dementia, moderate, without behavioral disturbance, psychotic disturbance, mood disturbance, and anxiety: Secondary | ICD-10-CM | POA: Diagnosis not present

## 2021-09-15 DIAGNOSIS — W010XXA Fall on same level from slipping, tripping and stumbling without subsequent striking against object, initial encounter: Secondary | ICD-10-CM | POA: Diagnosis present

## 2021-09-15 DIAGNOSIS — S72142D Displaced intertrochanteric fracture of left femur, subsequent encounter for closed fracture with routine healing: Secondary | ICD-10-CM | POA: Diagnosis not present

## 2021-09-15 DIAGNOSIS — S72042A Displaced fracture of base of neck of left femur, initial encounter for closed fracture: Secondary | ICD-10-CM | POA: Diagnosis not present

## 2021-09-15 DIAGNOSIS — M818 Other osteoporosis without current pathological fracture: Secondary | ICD-10-CM | POA: Diagnosis not present

## 2021-09-15 DIAGNOSIS — R5383 Other fatigue: Secondary | ICD-10-CM | POA: Diagnosis not present

## 2021-09-15 LAB — TYPE AND SCREEN
ABO/RH(D): O POS
Antibody Screen: NEGATIVE

## 2021-09-15 LAB — CBC WITH DIFFERENTIAL/PLATELET
Abs Immature Granulocytes: 0.05 10*3/uL (ref 0.00–0.07)
Basophils Absolute: 0 10*3/uL (ref 0.0–0.1)
Basophils Relative: 0 %
Eosinophils Absolute: 0 10*3/uL (ref 0.0–0.5)
Eosinophils Relative: 0 %
HCT: 35.3 % — ABNORMAL LOW (ref 36.0–46.0)
Hemoglobin: 11.6 g/dL — ABNORMAL LOW (ref 12.0–15.0)
Immature Granulocytes: 1 %
Lymphocytes Relative: 11 %
Lymphs Abs: 1.2 10*3/uL (ref 0.7–4.0)
MCH: 29.4 pg (ref 26.0–34.0)
MCHC: 32.9 g/dL (ref 30.0–36.0)
MCV: 89.4 fL (ref 80.0–100.0)
Monocytes Absolute: 0.4 10*3/uL (ref 0.1–1.0)
Monocytes Relative: 4 %
Neutro Abs: 9.1 10*3/uL — ABNORMAL HIGH (ref 1.7–7.7)
Neutrophils Relative %: 84 %
Platelets: 255 10*3/uL (ref 150–400)
RBC: 3.95 MIL/uL (ref 3.87–5.11)
RDW: 13.4 % (ref 11.5–15.5)
WBC: 10.9 10*3/uL — ABNORMAL HIGH (ref 4.0–10.5)
nRBC: 0 % (ref 0.0–0.2)

## 2021-09-15 LAB — BASIC METABOLIC PANEL
Anion gap: 11 (ref 5–15)
BUN: 25 mg/dL — ABNORMAL HIGH (ref 8–23)
CO2: 24 mmol/L (ref 22–32)
Calcium: 9.3 mg/dL (ref 8.9–10.3)
Chloride: 106 mmol/L (ref 98–111)
Creatinine, Ser: 1.94 mg/dL — ABNORMAL HIGH (ref 0.44–1.00)
GFR, Estimated: 23 mL/min — ABNORMAL LOW (ref >60)
Glucose, Bld: 107 mg/dL — ABNORMAL HIGH (ref 70–99)
Potassium: 4.8 mmol/L (ref 3.5–5.1)
Sodium: 141 mmol/L (ref 135–145)

## 2021-09-15 LAB — PROTIME-INR
INR: 1 (ref 0.8–1.2)
Prothrombin Time: 12.9 seconds (ref 11.4–15.2)

## 2021-09-15 IMAGING — DX DG PELVIS 1-2V
1 series · 1 of 1 positions shown · non-contrast
Comparison: None Available.

CLINICAL DATA: Trauma, fall

EXAM:
PELVIS - 1-2 VIEW

[pelvis]
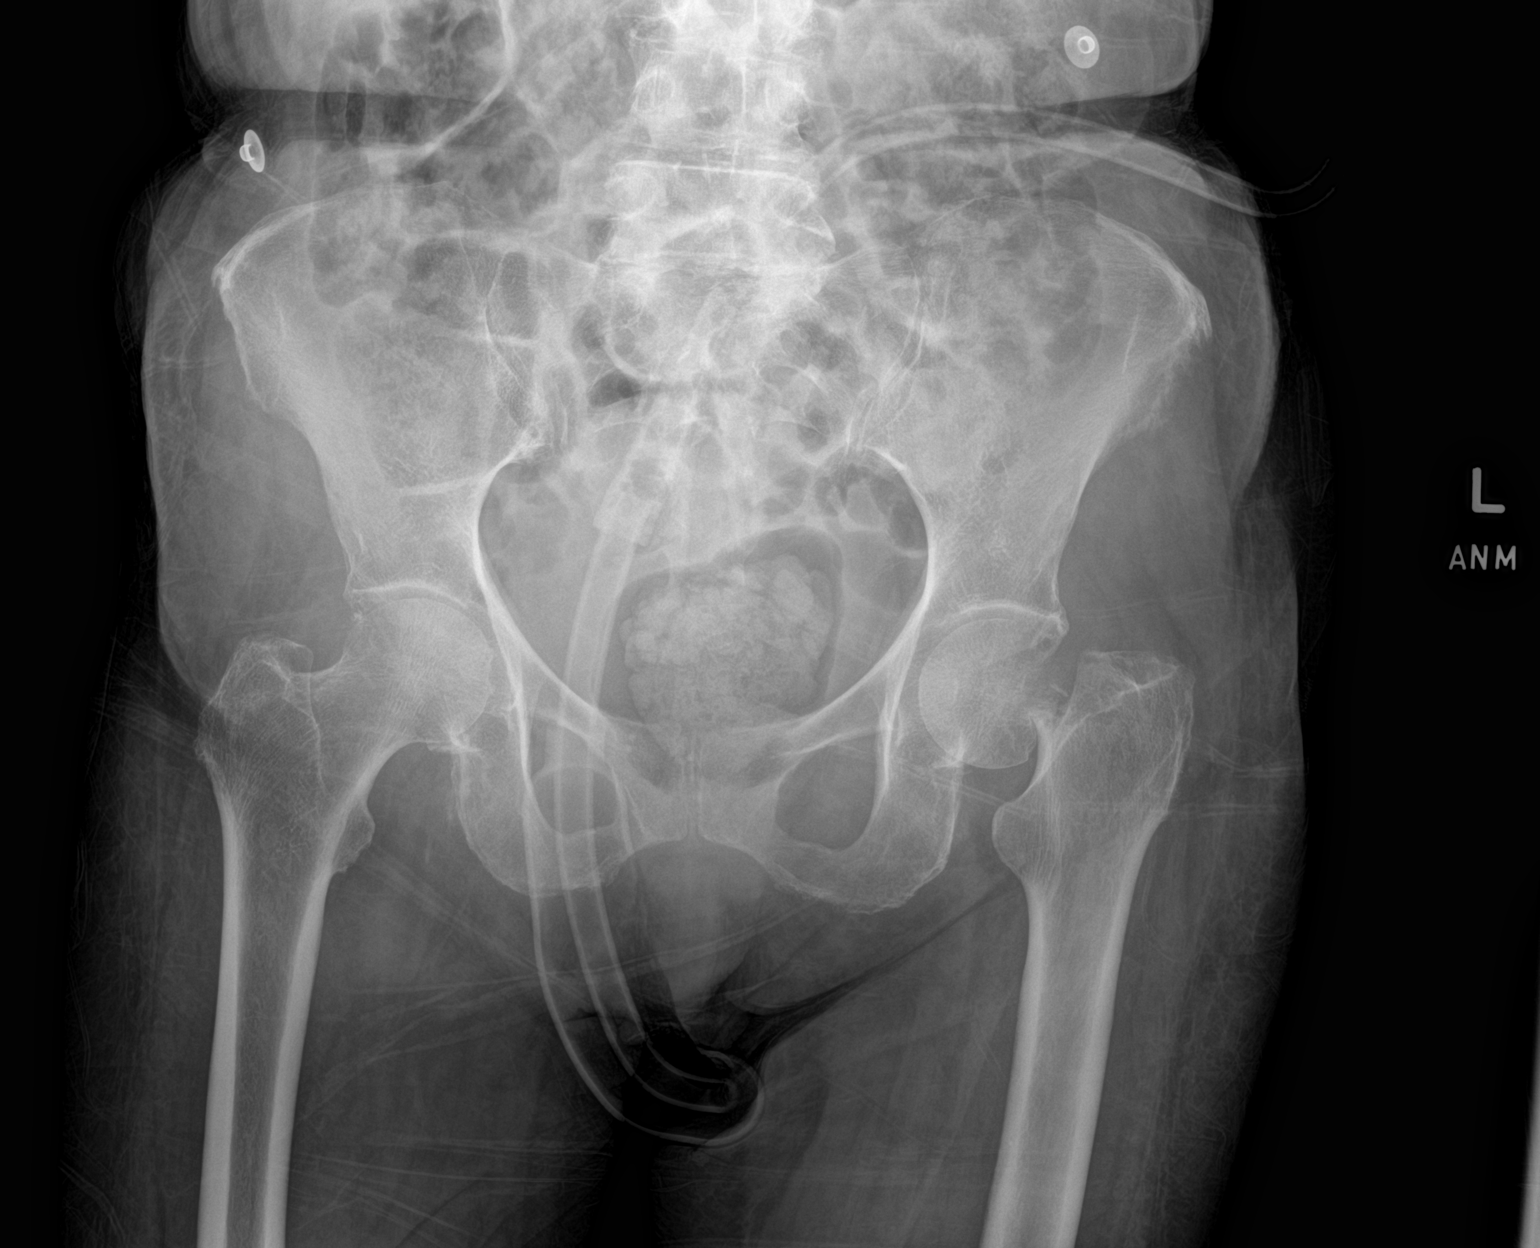

[1 of 1 positions shown; findings below may reference images not displayed]

FINDINGS: Comminuted fracture is seen in the neck of left femur. There is
superior displacement of distal major fracture fragment.
Degenerative changes are noted in the lower lumbar spine.
IMPRESSION: Comminuted displaced fracture is seen in the neck of left femur.

## 2021-09-15 IMAGING — DX DG FEMUR 2+V*L*
1 series · 5 of 5 positions shown · non-contrast
Comparison: None Available.

CLINICAL DATA: Trauma, fall

EXAM:
LEFT FEMUR 2 VIEWS

[Series 1: femur · 0.14mm/px · 5 of 5 slices shown]
[im 1/5]
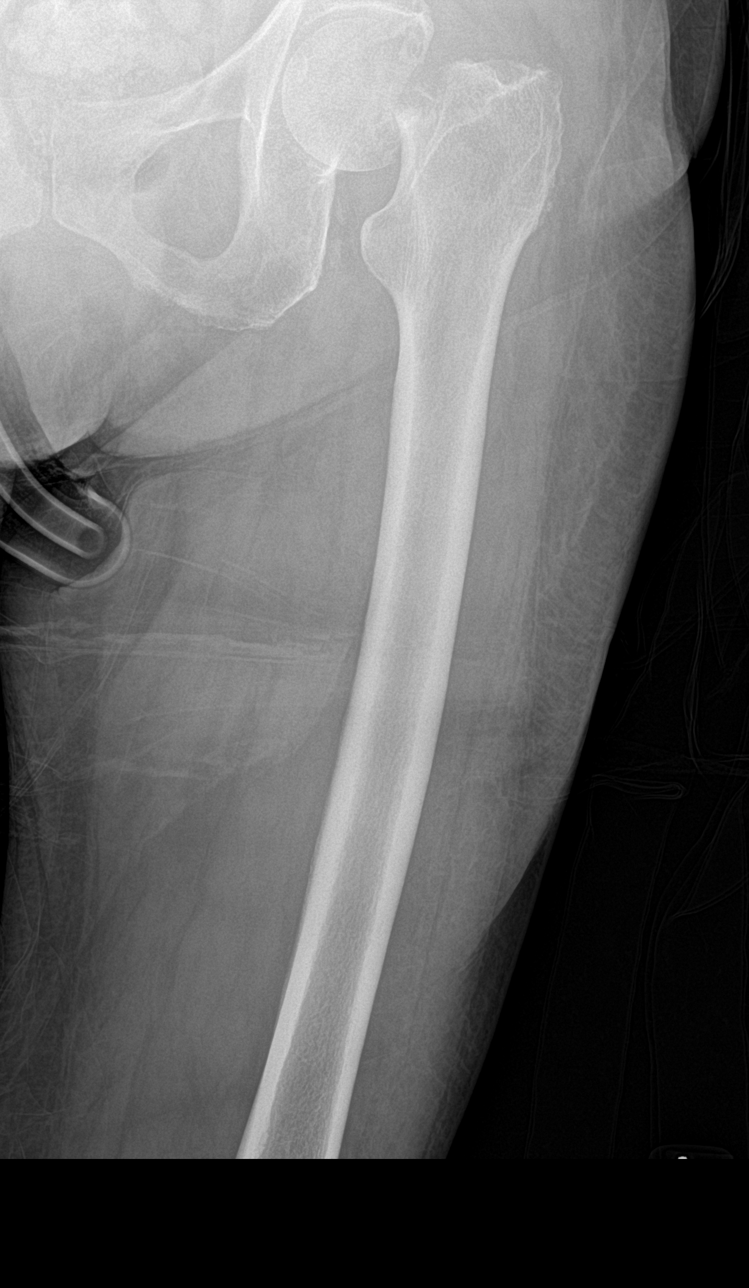
[im 2/5]
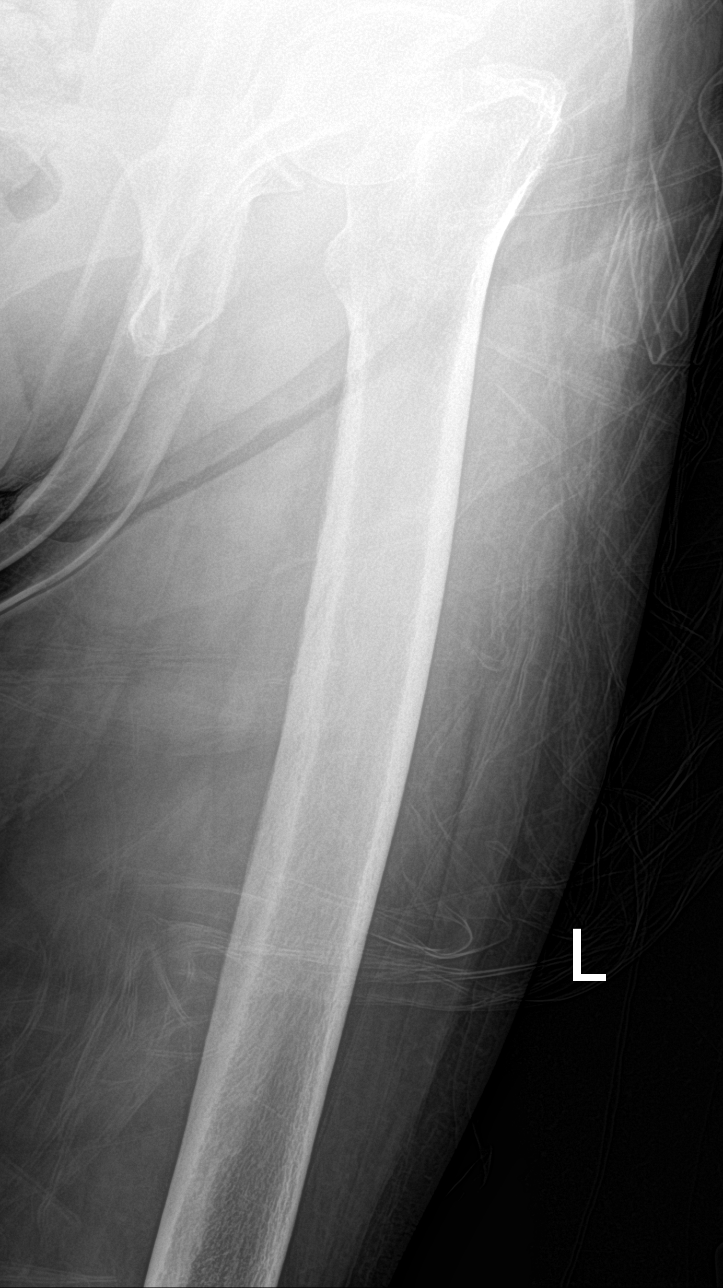
[im 3/5]
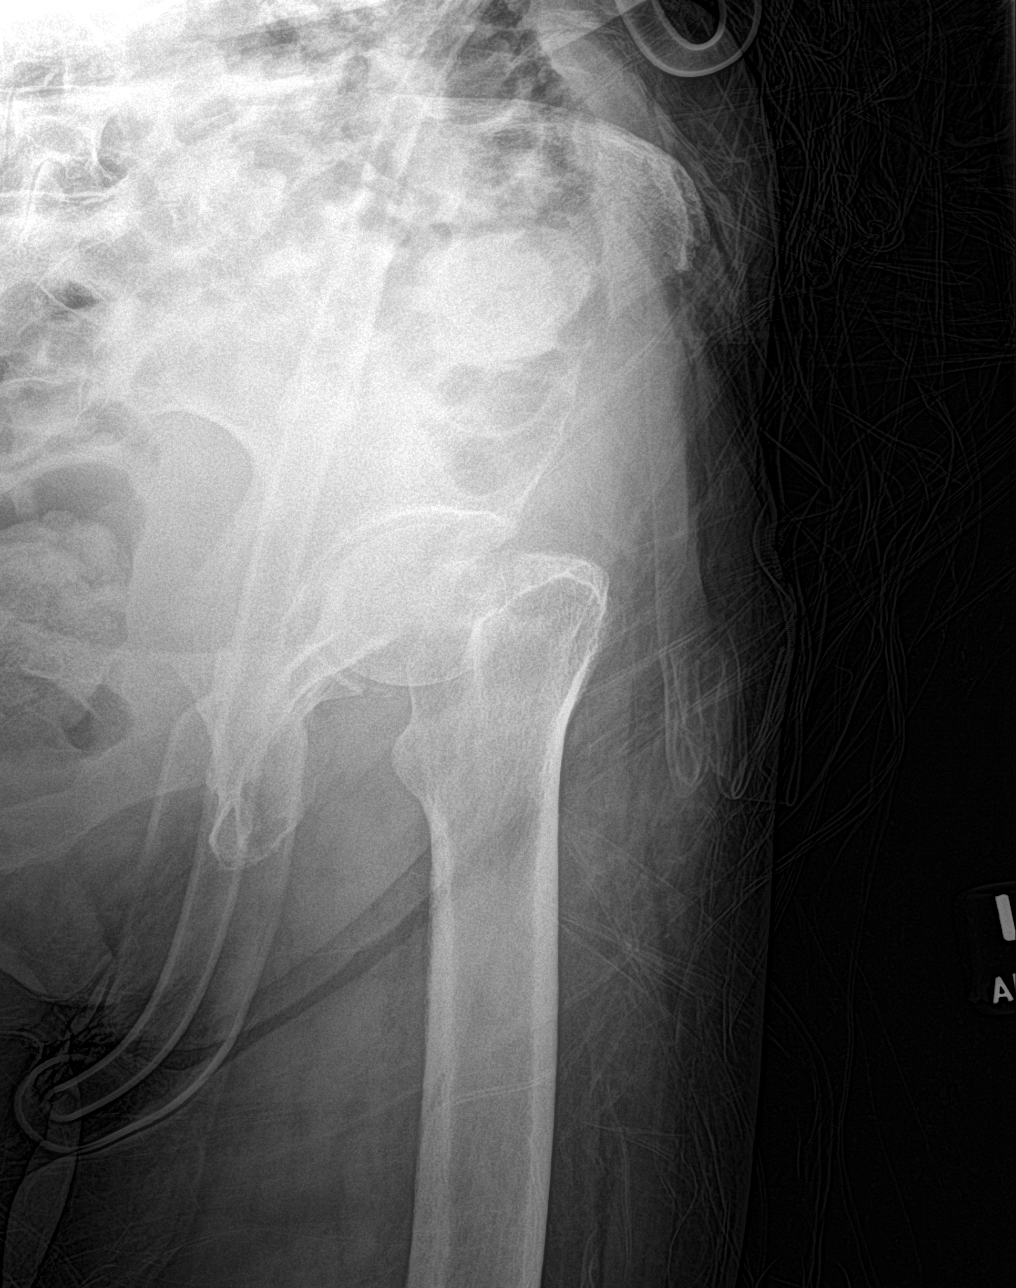
[im 4/5]
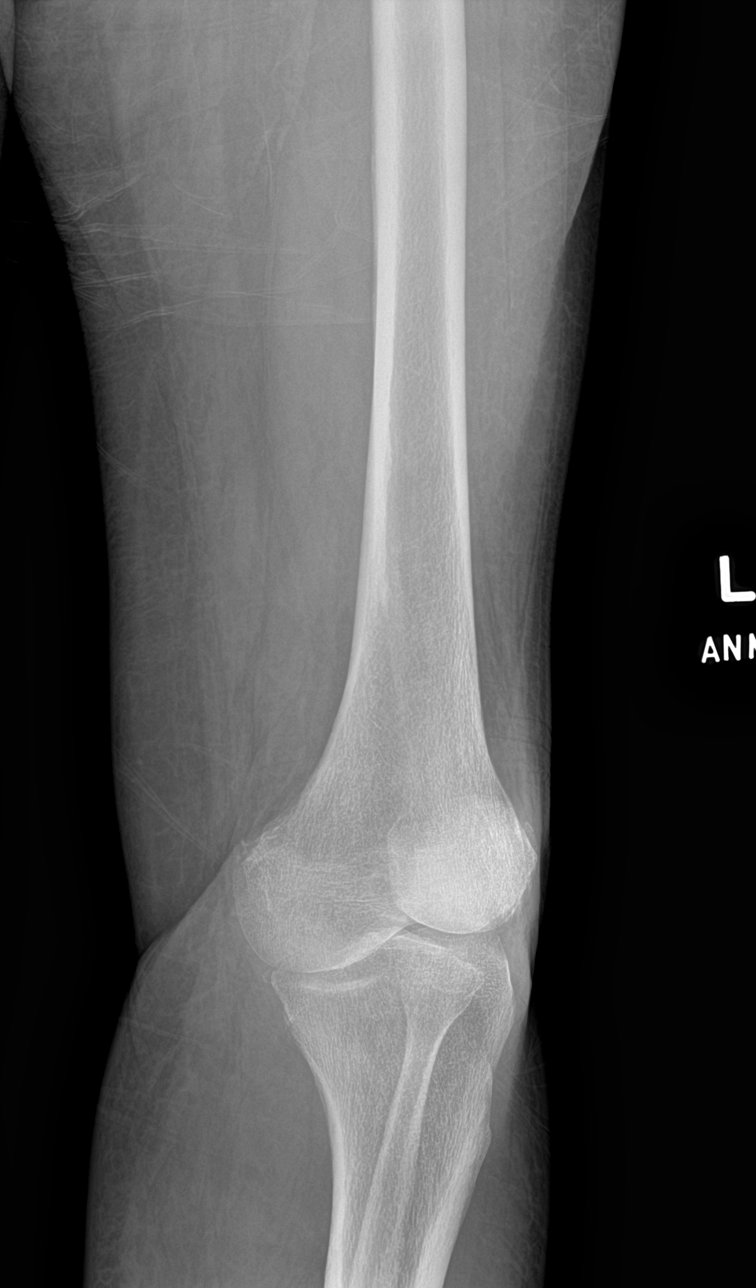
[im 5/5]
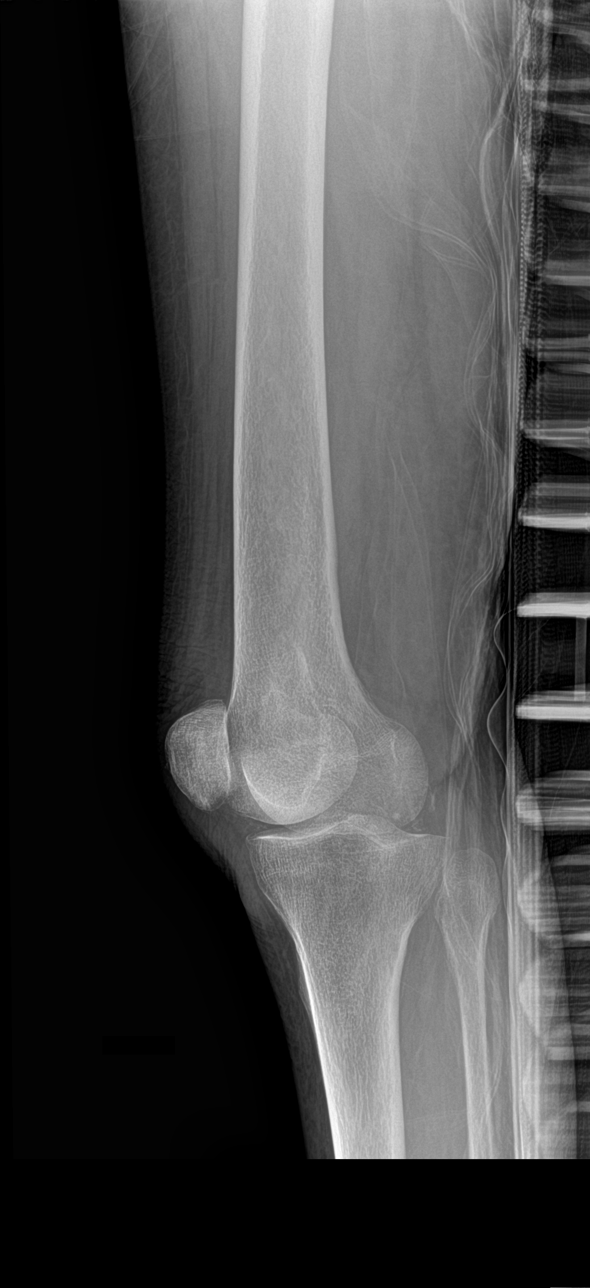

[5 of 5 positions shown; findings below may reference images not displayed]

FINDINGS: Fracture is seen in the subcapital portion neck of left femur. There
is superior displacement of distal major fracture fragment. There is
varus deformity at the fracture site. There is no dislocation.
IMPRESSION: Displaced fracture is seen in the neck of proximal left femur.

## 2021-09-15 MED ORDER — LOSARTAN POTASSIUM 25 MG PO TABS
25.0000 mg | ORAL_TABLET | Freq: Every day | ORAL | Status: DC
  Filled 2021-09-15: qty 1

## 2021-09-15 MED ORDER — FENTANYL CITRATE PF 50 MCG/ML IJ SOSY
25.0000 ug | PREFILLED_SYRINGE | Freq: Once | INTRAMUSCULAR | Status: AC
  Administered 2021-09-15: 25 ug via INTRAVENOUS
  Filled 2021-09-15: qty 1

## 2021-09-15 MED ORDER — MEMANTINE HCL 10 MG PO TABS
10.0000 mg | ORAL_TABLET | Freq: Every day | ORAL | Status: DC
  Administered 2021-09-17 – 2021-09-24 (×8): 10 mg via ORAL
  Filled 2021-09-15 (×10): qty 1

## 2021-09-15 MED ORDER — ALLOPURINOL 100 MG PO TABS
200.0000 mg | ORAL_TABLET | Freq: Every day | ORAL | Status: DC
Start: 2021-09-17 — End: 2021-09-15

## 2021-09-15 MED ORDER — OXYCODONE HCL 5 MG PO TABS
2.5000 mg | ORAL_TABLET | Freq: Four times a day (QID) | ORAL | Status: DC
  Administered 2021-09-15: 2.5 mg via ORAL
  Filled 2021-09-15 (×2): qty 1

## 2021-09-15 MED ORDER — ASPIRIN 81 MG PO TBEC: 81.0000 mg | DELAYED_RELEASE_TABLET | Freq: Every day | ORAL | Status: DC

## 2021-09-15 MED ORDER — PANTOPRAZOLE SODIUM 40 MG PO TBEC
40.0000 mg | DELAYED_RELEASE_TABLET | Freq: Every day | ORAL | Status: DC
  Administered 2021-09-17 – 2021-09-24 (×8): 40 mg via ORAL
  Filled 2021-09-15 (×8): qty 1

## 2021-09-15 MED ORDER — ALLOPURINOL 100 MG PO TABS
200.0000 mg | ORAL_TABLET | Freq: Every day | ORAL | Status: DC
  Administered 2021-09-17 – 2021-09-24 (×8): 200 mg via ORAL
  Filled 2021-09-15 (×8): qty 2

## 2021-09-15 MED ORDER — ONDANSETRON HCL 4 MG/2ML IJ SOLN
4.0000 mg | Freq: Once | INTRAMUSCULAR | Status: AC
  Administered 2021-09-15: 4 mg via INTRAVENOUS
  Filled 2021-09-15: qty 2

## 2021-09-15 MED ORDER — ATORVASTATIN CALCIUM 10 MG PO TABS
20.0000 mg | ORAL_TABLET | Freq: Every day | ORAL | Status: DC
  Administered 2021-09-17 – 2021-09-24 (×8): 20 mg via ORAL
  Filled 2021-09-15 (×8): qty 2

## 2021-09-15 MED ORDER — LOSARTAN POTASSIUM 25 MG PO TABS
25.0000 mg | ORAL_TABLET | Freq: Every day | ORAL | Status: DC
  Administered 2021-09-15: 25 mg via ORAL
  Filled 2021-09-15: qty 1

## 2021-09-15 MED ORDER — HEPARIN SODIUM (PORCINE) 5000 UNIT/ML IJ SOLN
5000.0000 [IU] | Freq: Three times a day (TID) | INTRAMUSCULAR | Status: DC
  Administered 2021-09-15 – 2021-09-16 (×2): 5000 [IU] via SUBCUTANEOUS
  Filled 2021-09-15 (×2): qty 1

## 2021-09-15 MED ORDER — OXYCODONE HCL 5 MG PO TABS
2.5000 mg | ORAL_TABLET | Freq: Four times a day (QID) | ORAL | Status: DC | PRN
  Administered 2021-09-15: 2.5 mg via ORAL
  Filled 2021-09-15: qty 1

## 2021-09-15 MED ORDER — ATORVASTATIN CALCIUM 10 MG PO TABS
20.0000 mg | ORAL_TABLET | Freq: Every day | ORAL | Status: DC
Start: 2021-09-16 — End: 2021-09-15

## 2021-09-15 MED ORDER — ACETAMINOPHEN 325 MG PO TABS
650.0000 mg | ORAL_TABLET | Freq: Four times a day (QID) | ORAL | Status: DC
  Administered 2021-09-15 – 2021-09-23 (×28): 650 mg via ORAL
  Filled 2021-09-15 (×27): qty 2

## 2021-09-15 MED ORDER — MUPIROCIN 2 % EX OINT
1.0000 "application " | TOPICAL_OINTMENT | Freq: Two times a day (BID) | CUTANEOUS | Status: DC
  Administered 2021-09-16: 1 via NASAL
  Filled 2021-09-15: qty 22

## 2021-09-15 MED ORDER — HEPARIN SODIUM (PORCINE) 5000 UNIT/ML IJ SOLN: 5000.0000 [IU] | Freq: Three times a day (TID) | INTRAMUSCULAR | Status: DC

## 2021-09-15 NOTE — Assessment & Plan Note (Addendum)
Patient presented with fall from standing as she turned around to walk without her walker.  X-ray of left femur in the ED with displaced fracture in the neck of the proximal left femur. Orthopedics consulted in the ED who completed left total arthroplasty; appreciate assistance in patient's care.  PT/OT recommending SNF placement; family in agreement.  - Pain control with Tylenol 650 mg every 6 hours and Roxicodone 2.5 mg every 6 hours as needed breakthrough pain -Fall precautions -Delirium precautions

## 2021-09-15 NOTE — Progress Notes (Signed)
Ortho Note  88 yo with displaced femoral neck fracture. Will require hemiarthroplasty. Plan for surgery tomorrow. Timing and surgeon TBD based on availability. Hospitalist admit. NPO at midnight. Formal ortho consult to follow.  Shona Needles, MD [facility] [phone] (office) [email or web address]

## 2021-09-15 NOTE — ED Provider Notes (Signed)
[facility] EMERGENCY DEPARTMENT Provider Note   CSN: [id number] Arrival date & time: 09/15/21  1049     History  Chief Complaint  Patient presents with   Hip Pain    Tracy [name] is a 88 y.o. female.   Hip Pain    88 year old female presenting to the emergency department as a nonlevel trauma after a ground-level mechanical fall.  The patient left her walker indoors and turned around to walk back inside when she tripped over the door frame.  She did not hit her head or lose consciousness.  She twisted her leg on the way down and developed pain about her left hip with shortening and external rotation of the left lower extremity.  She has been nonambulatory since the fall.  She denies any other injuries or complaints. She arrived GCS 15, ABC intact.  Home Medications Prior to Admission medications   Medication Sig Start Date End Date Taking? Authorizing Provider  allopurinol (ZYLOPRIM) 100 MG tablet 2 tablets    [provider]  aspirin 81 MG EC tablet 1 tablet 04/16/21   [provider]  atorvastatin (LIPITOR) 20 MG tablet 1 tablet    [provider]  furosemide (LASIX) 40 MG tablet Take 1 tablet (40 mg total) by mouth daily. 02/03/21   Thurnell Lose, MD  losartan (COZAAR) 50 MG tablet 1 tablet    [provider]  memantine (NAMENDA TITRATION PAK) tablet pack 5 mg/day for =1 week; 5 mg twice daily for =1 week; 15 mg/day given in 5 mg and 10 mg separated doses for =1 week; then 10 mg twice daily 07/29/21   Frann Rider, NP  memantine (NAMENDA) 10 MG tablet TAKE 1 TABLET BY MOUTH TWICE A DAY 08/27/21   Frann Rider, NP  pantoprazole (PROTONIX) 40 MG tablet 1 tablet    [provider]  [place]     [provider]      Allergies    Patient has no known allergies.    Review of Systems   Review of Systems  All other systems reviewed and are negative.   Physical Exam Updated Vital Signs BP  (!) 174/104   Pulse 98   Temp 99 F (37.2 C) (Oral)   Resp (!) 33   Ht 4\' 9"  (1.448 m)   Wt 43.1 kg   SpO2 99%   BMI 20.56 kg/m  Physical Exam Vitals and nursing note reviewed.  Constitutional:      General: She is not in acute distress.    Appearance: She is well-developed.     Comments: GCS 15, ABC intact  HENT:     Head: Normocephalic and atraumatic.  Eyes:     Extraocular Movements: Extraocular movements intact.     Conjunctiva/sclera: Conjunctivae normal.     Pupils: Pupils are equal, round, and reactive to light.  Neck:     Comments: No midline tenderness to palpation of the cervical spine.  Range of motion intact Cardiovascular:     Rate and Rhythm: Normal rate and regular rhythm.  Pulmonary:     Effort: Pulmonary effort is normal. No respiratory distress.     Breath sounds: Normal breath sounds.  Chest:     Comments: Clavicles stable nontender to AP compression.  Chest wall stable and nontender to AP and lateral compression. Abdominal:     Palpations: Abdomen is soft.     Tenderness: There is no abdominal tenderness.  Musculoskeletal:  Cervical back: Neck supple.     Comments: No midline tenderness to palpation of the thoracic or lumbar spine.  Left lower extremity neurovascular intact, shortened and externally rotated, 2+ DP pulses, intact sensation to light touch, tenderness to palpation about the left hip, pain with any attempts at motion.  Skin:    General: Skin is warm and dry.  Neurological:     Mental Status: She is alert.     Comments: Cranial nerves II through XII grossly intact.  Moving all 4 extremities spontaneously.  Sensation grossly intact all 4 extremities     ED Results / Procedures / Treatments   Labs (all labs ordered are listed, but only abnormal results are displayed) Labs Reviewed  CBC WITH DIFFERENTIAL/PLATELET - Abnormal; Notable for the following components:      Result Value   WBC 10.9 (*)    Hemoglobin 11.6 (*)    HCT 35.3 (*)     Neutro Abs 9.1 (*)    All other components within normal limits  BASIC METABOLIC PANEL - Abnormal; Notable for the following components:   Glucose, Bld 107 (*)    BUN 25 (*)    Creatinine, Ser 1.94 (*)    GFR, Estimated 23 (*)    All other components within normal limits  PROTIME-INR  TYPE AND SCREEN  ABO/RH    EKG None  Radiology DG Pelvis 1-2 Views  Result Date: 09/15/2021 CLINICAL DATA:  Trauma, fall EXAM: PELVIS - 1-2 VIEW COMPARISON:  None Available. FINDINGS: Comminuted fracture is seen in the neck of left femur. There is superior displacement of distal major fracture fragment. Degenerative changes are noted in the lower lumbar spine. IMPRESSION: Comminuted displaced fracture is seen in the neck of left femur. Electronically Signed   By: Elmer Picker M.D.   On: 09/15/2021 11:52   DG Femur Min 2 Views Left  Result Date: 09/15/2021 CLINICAL DATA:  Trauma, fall EXAM: LEFT FEMUR 2 VIEWS COMPARISON:  None Available. FINDINGS: Fracture is seen in the subcapital portion neck of left femur. There is superior displacement of distal major fracture fragment. There is varus deformity at the fracture site. There is no dislocation. IMPRESSION: Displaced fracture is seen in the neck of proximal left femur. Electronically Signed   By: Elmer Picker M.D.   On: 09/15/2021 11:51    Procedures Procedures    Medications Ordered in ED Medications  fentaNYL (SUBLIMAZE) injection 25 mcg (has no administration in time range)  fentaNYL (SUBLIMAZE) injection 25 mcg (25 mcg Intravenous Given 09/15/21 1152)  ondansetron (ZOFRAN) injection 4 mg (4 mg Intravenous Given 09/15/21 1152)    ED Course/ Medical Decision Making/ A&P                           Medical Decision Making Amount and/or Complexity of Data Reviewed Labs: ordered. Radiology: ordered.  Risk Prescription drug management. Decision regarding hospitalization.    88 year old female presenting to the emergency  department as a nonlevel trauma after a ground-level mechanical fall.  The patient left her walker indoors and turned around to walk back inside when she tripped over the door frame.  She did not hit her head or lose consciousness.  She twisted her leg on the way down and developed pain about her left hip with shortening and external rotation of the left lower extremity.  She has been nonambulatory since the fall.  She denies any other injuries or complaints. She arrived GCS  15, ABC intact.  X-ray imaging of the left hip revealed a left femoral neck fracture.  The patient arrived GCS 15, ABC intact with a left lower extremity that was neurovascularly intact.  Spoke with Dr. Doreatha Martin of orthopedic surgery who plans to operate in the a.m., recommended hospitalist admission.  Family medicine consulted.  On reevaluation, the patient was persistently hypoxic with a good waveform to the high 80s, not complaining of chest pain or shortness of breath, lungs clear to auscultation bilaterally.  Patient just received fentanyl.  We will place the patient on 2 L O2 and continue to monitor.  Feeling members were updated bedside regarding the patient's diagnosis of femoral neck fracture.  The patient was subsequently admitted to family practice management in stable condition.   Final Clinical Impression(s) / ED Diagnoses Final diagnoses:  Closed fracture of neck of left femur, initial encounter ([place])  Fall, initial encounter    Rx / DC Orders ED Discharge Orders     None         Regan Lemming, MD 09/15/21 1355

## 2021-09-15 NOTE — Assessment & Plan Note (Addendum)
Creatinine improved today at  previously; baseline appears 1.9-2.2. -Continue to monitor -Avoid nephrotoxic agents.  -AM BMP

## 2021-09-15 NOTE — H&P (Cosign Needed Addendum)
Hospital Admission History and Physical Service Pager: [phone]  Patient name: Tracy [name] Medical record number: [id number] Date of Birth: 06-15-1919 Age: 88 y.o. Gender: female  Primary Care Provider: Charlane Ferretti, MD Consultants: Orthopedics Code Status: DNR/DNI, which was confirmed with family Preferred Emergency Contact:  Contact Information     Name Relation Home Work Mobile   [place] Niece [phone]  [phone]   [place] Relative [phone]  [phone]        Chief Complaint: Left hip pain after a fall  Assessment and Plan: Tracy [name] is a 88 y.o. female presenting with femoral neck fracture after mechanical fall from standing. Differential for presentation of this includes osteoporosis versus osteopenia in an elderly patient.   * Displaced fracture of left femoral neck [facility]) Patient presenting with mechanical fall from standing leaving home for church today.  Walks with walker at baseline, and forgot walker outside; fell as she turned around to walk without it.  X-ray of left femur in the ED with displaced fracture in the neck of the proximal left femur; redemonstrated on pelvic x-ray with no other acute changes.  Orthopedics consulted in the ED who advised that patient will require hemiarthroplasty and plan for surgery tomorrow; appreciate assistance in patient's care. - Admit to FPTS, progressive, Dr. McDiarmid, attending. - Plan for left hemiarthroplasty tomorrow, per orthopedics -N.p.o. at midnight -Vitals per floor protocol - Pain control with Roxicodone 2.5 mg every 6 hours, 2.5 mg every 6 hours as needed breakthrough pain, and Tylenol 650 mg every 6 hours -Fall precautions -Delirium precautions  Essential hypertension Severely elevated, asymptomatic.  Likely secondary to medication nonadherence (did not take her antihypertensive today) and pain. - resume home losartan 25 mg (given today), will hold ARB for planned surgery  tomorrow  Chronic kidney disease, stage 4 (severe) (HCC) Avoid nephrotoxic agents.  Will obtain VQ scan in light of renal function supports to prevent contrast from CT scan.  Fall Presented with a mechanical fall from standing after tripping while turning around from walker.  Hypoxia Patient was not hypoxic on admission.  Took 1 dose of fentanyl in ED, and desat to 79% approximately 20 minutes after medication.  Restored sats with 2 LNC.  Suspect due to fentanyl dose, but in light of patient's age and long bone fracture, want to rule out fat embolus as a source of hypoxia. - VQ scan pending in light of patient's renal function, would prefer not to order CT scan and introduce contrast.    Other problems chronic and stable: Vascular dementia-continue memantine HLD-continue atorvastatin Gout-continue allopurinol History of CVA-continue ASA    FEN/GI: Regular; n.p.o. at midnight VTE Prophylaxis: SQ heparin  Disposition: pending surgery  History of Present Illness:  Tracy [name] is a 88 y.o. female presenting with displaced fracture of L femoral head.  She is seen at bedside with her niece, [name].  Patient and niece report that patient left a walker outside of home while waiting for church [place], and turned around to walk back into her home when she tripped and fell.  She was found by the church [employer] account planner, who called EMS.  Patient denies prodromal symptoms prior to falling, including dizziness, loss of consciousness, lightheadedness, chest pain, shortness of breath, or palpitations.  She denies hitting her head, and remembers the event in its entirety.   In the ED, orthopedics consulted and to perform hemiarthroplasty tomorrow. Patient was given one-time dose of Fentanyl 25 mcg for pain, and approximately  20 minutes later had a desaturation episode to 79%, recovered with 2L O2.  Review Of Systems: Per HPI with the following additions: Denies recent illness, abdominal pain,  headache.  Pertinent Past Medical History: CKD stage IV HTN HLD Multiple strokes Anemia of chronic disease Vascular dementia  Remainder reviewed in history tab.   Pertinent Past Surgical History: None reported Remainder reviewed in history tab.  Pertinent Social History: Tobacco use: Never smoker Alcohol use: Rare use Other Substance use: Never user Lives alone at nighttime, but has daytime caregivers.  Family checks on her frequently and assist as needed.  Ambulates with rolling walker, and independent with ADLs.  Pertinent Family History: Mother-cancer, unspecified Sister-dementia No family history of heart disease reported  Remainder reviewed in history tab.   Important Outpatient Medications: Allopurinol 200 mg daily Aspirin 81 mg daily Atorvastatin 20 mg daily Furosemide 40 mg daily Losartan 50 mg daily Memantine 10 mg twice daily Protonix 40 mg daily  Remainder reviewed in medication history.   Objective: BP (!) 156/99 (BP Location: Left Arm)   Pulse 89   Temp 97.8 F (36.6 C)   Resp 18   Ht 4\' 9"  (1.448 m)   Wt 43.1 kg   SpO2 100%   BMI 20.56 kg/m  Exam: General: Well-appearing elderly female patient in pain, but NAD Eyes: EOMI ENTM: Moist mucous membranes Neck: Supple, normal ROM Cardiovascular: Tachycardic rate, normal rhythm, no M/R/G Respiratory: CTA x2.  2 L nasal cannula in place, as patient began to hold breath while in pain, causing desaturation to approximately 88%.  Oxygen placed for comfort. Gastrointestinal: Soft, NT/ND MSK: Significantly tender left hip to mild palpation.  Remainder of left lower extremity without pain, and normal right lower extremity.  Derm: No abnormalities noted Neuro: Patient at baseline mentation, alert and oriented to person, place, and situation.  Sensation intact and symmetrical Psych: Normal mood and affect.  Labs:  CBC BMET  Recent Labs  Lab 09/15/21 1118  WBC 10.9*  HGB 11.6*  HCT 35.3*  PLT 255    Recent Labs  Lab 09/15/21 1118  NA 141  K 4.8  CL 106  CO2 24  BUN 25*  CREATININE 1.94*  GLUCOSE 107*  CALCIUM 9.3     EKG: Sinus tachycardia   Imaging Studies Performed:  DG left femur and DG pelvis both with comminuted displaced fracture in the neck of the proximal left femur    Rosezetta Schlatter, MD 09/15/2021, 8:36 PM PGY-1, [place] Intern pager: [phone], text pages welcome Secure chat group [place]   I was personally present and performed or re-performed the history, physical exam and medical decision making activities of this service and have verified that the service and findings are accurately documented in the resident's note.  Zola Button, MD                  09/15/2021, 8:36 PM

## 2021-09-15 NOTE — ED Notes (Signed)
Pt cleaned up, linen changed, pt changed into gown, and purewick readjusted

## 2021-09-15 NOTE — ED Notes (Signed)
Pt placed on 2L [place]

## 2021-09-15 NOTE — Assessment & Plan Note (Addendum)
Home losartan discontinued in light of renal function. - Continue amlodipine 10 mg daily

## 2021-09-15 NOTE — ED Triage Notes (Signed)
[place] walking out of the house going to church.  DId not hit head or have LOC.  Family assisted her back to chair but unable to bear weight.  Left leg has shortening and rotation.

## 2021-09-15 NOTE — Assessment & Plan Note (Deleted)
Patient was not hypoxic on admission.  Took 1 dose of fentanyl in ED, and desat to 79% approximately 20 minutes after medication.  Restored sats with 2 LNC.  Suspect due to fentanyl dose, but in light of patient's age and long bone fracture, considered VQ scan. - VQ scan not obtained, as symptoms resolved by the time schedule permitted scan to be completed.

## 2021-09-15 NOTE — Assessment & Plan Note (Deleted)
Presented with a fall from standing after tripping while turning without the assistance of her walker.

## 2021-09-16 ENCOUNTER — Inpatient Hospital Stay (HOSPITAL_COMMUNITY): Payer: Medicare PPO

## 2021-09-16 ENCOUNTER — Encounter (HOSPITAL_COMMUNITY)
Admission: EM | Disposition: A | Discharge status: Skilled Nursing Facility | Payer: Self-pay | Source: Home / Self Care | Attending: Family Medicine

## 2021-09-16 ENCOUNTER — Encounter (HOSPITAL_COMMUNITY): Payer: Self-pay | Admitting: Family Medicine

## 2021-09-16 ENCOUNTER — Other Ambulatory Visit: Payer: Self-pay

## 2021-09-16 ENCOUNTER — Inpatient Hospital Stay (HOSPITAL_COMMUNITY): Payer: Medicare PPO | Admitting: Certified Registered Nurse Anesthetist

## 2021-09-16 ENCOUNTER — Ambulatory Visit: Payer: Self-pay | Admitting: Student

## 2021-09-16 DIAGNOSIS — I129 Hypertensive chronic kidney disease with stage 1 through stage 4 chronic kidney disease, or unspecified chronic kidney disease: Secondary | ICD-10-CM | POA: Diagnosis not present

## 2021-09-16 DIAGNOSIS — W19XXXD Unspecified fall, subsequent encounter: Secondary | ICD-10-CM

## 2021-09-16 DIAGNOSIS — N184 Chronic kidney disease, stage 4 (severe): Secondary | ICD-10-CM

## 2021-09-16 DIAGNOSIS — Z8739 Personal history of other diseases of the musculoskeletal system and connective tissue: Secondary | ICD-10-CM | POA: Insufficient documentation

## 2021-09-16 DIAGNOSIS — F0150 Vascular dementia without behavioral disturbance: Secondary | ICD-10-CM | POA: Insufficient documentation

## 2021-09-16 DIAGNOSIS — D631 Anemia in chronic kidney disease: Secondary | ICD-10-CM

## 2021-09-16 DIAGNOSIS — S72002A Fracture of unspecified part of neck of left femur, initial encounter for closed fracture: Secondary | ICD-10-CM

## 2021-09-16 DIAGNOSIS — R4189 Other symptoms and signs involving cognitive functions and awareness: Secondary | ICD-10-CM

## 2021-09-16 DIAGNOSIS — I63239 Cerebral infarction due to unspecified occlusion or stenosis of unspecified carotid arteries: Secondary | ICD-10-CM | POA: Insufficient documentation

## 2021-09-16 DIAGNOSIS — I10 Essential (primary) hypertension: Secondary | ICD-10-CM

## 2021-09-16 DIAGNOSIS — R0902 Hypoxemia: Secondary | ICD-10-CM

## 2021-09-16 HISTORY — DX: Personal history of other diseases of the musculoskeletal system and connective tissue: Z87.39

## 2021-09-16 HISTORY — PX: ANTERIOR APPROACH HEMI HIP ARTHROPLASTY: SHX6690

## 2021-09-16 HISTORY — DX: Cerebral infarction due to unspecified occlusion or stenosis of unspecified carotid artery: I63.239

## 2021-09-16 LAB — CBC
HCT: 36.4 % (ref 36.0–46.0)
Hemoglobin: 11.7 g/dL — ABNORMAL LOW (ref 12.0–15.0)
MCH: 28.8 pg (ref 26.0–34.0)
MCHC: 32.1 g/dL (ref 30.0–36.0)
MCV: 89.7 fL (ref 80.0–100.0)
Platelets: 233 10*3/uL (ref 150–400)
RBC: 4.06 MIL/uL (ref 3.87–5.11)
RDW: 13.4 % (ref 11.5–15.5)
WBC: 15.1 10*3/uL — ABNORMAL HIGH (ref 4.0–10.5)
nRBC: 0 % (ref 0.0–0.2)

## 2021-09-16 LAB — BASIC METABOLIC PANEL
Anion gap: 7 (ref 5–15)
BUN: 24 mg/dL — ABNORMAL HIGH (ref 8–23)
CO2: 27 mmol/L (ref 22–32)
Calcium: 8.7 mg/dL — ABNORMAL LOW (ref 8.9–10.3)
Chloride: 107 mmol/L (ref 98–111)
Creatinine, Ser: 1.92 mg/dL — ABNORMAL HIGH (ref 0.44–1.00)
GFR, Estimated: 23 mL/min — ABNORMAL LOW (ref >60)
Glucose, Bld: 123 mg/dL — ABNORMAL HIGH (ref 70–99)
Potassium: 3.9 mmol/L (ref 3.5–5.1)
Sodium: 141 mmol/L (ref 135–145)

## 2021-09-16 LAB — SURGICAL PCR SCREEN
MRSA, PCR: NEGATIVE
Staphylococcus aureus: NEGATIVE

## 2021-09-16 LAB — ABO/RH: ABO/RH(D): O POS

## 2021-09-16 IMAGING — DX DG PORTABLE PELVIS
1 series · 1 of 1 positions shown · non-contrast
Comparison: Hip radiograph dated [DATE]

CLINICAL DATA: Postop

EXAM:
PORTABLE PELVIS 1 VIEWS

[pelvis]
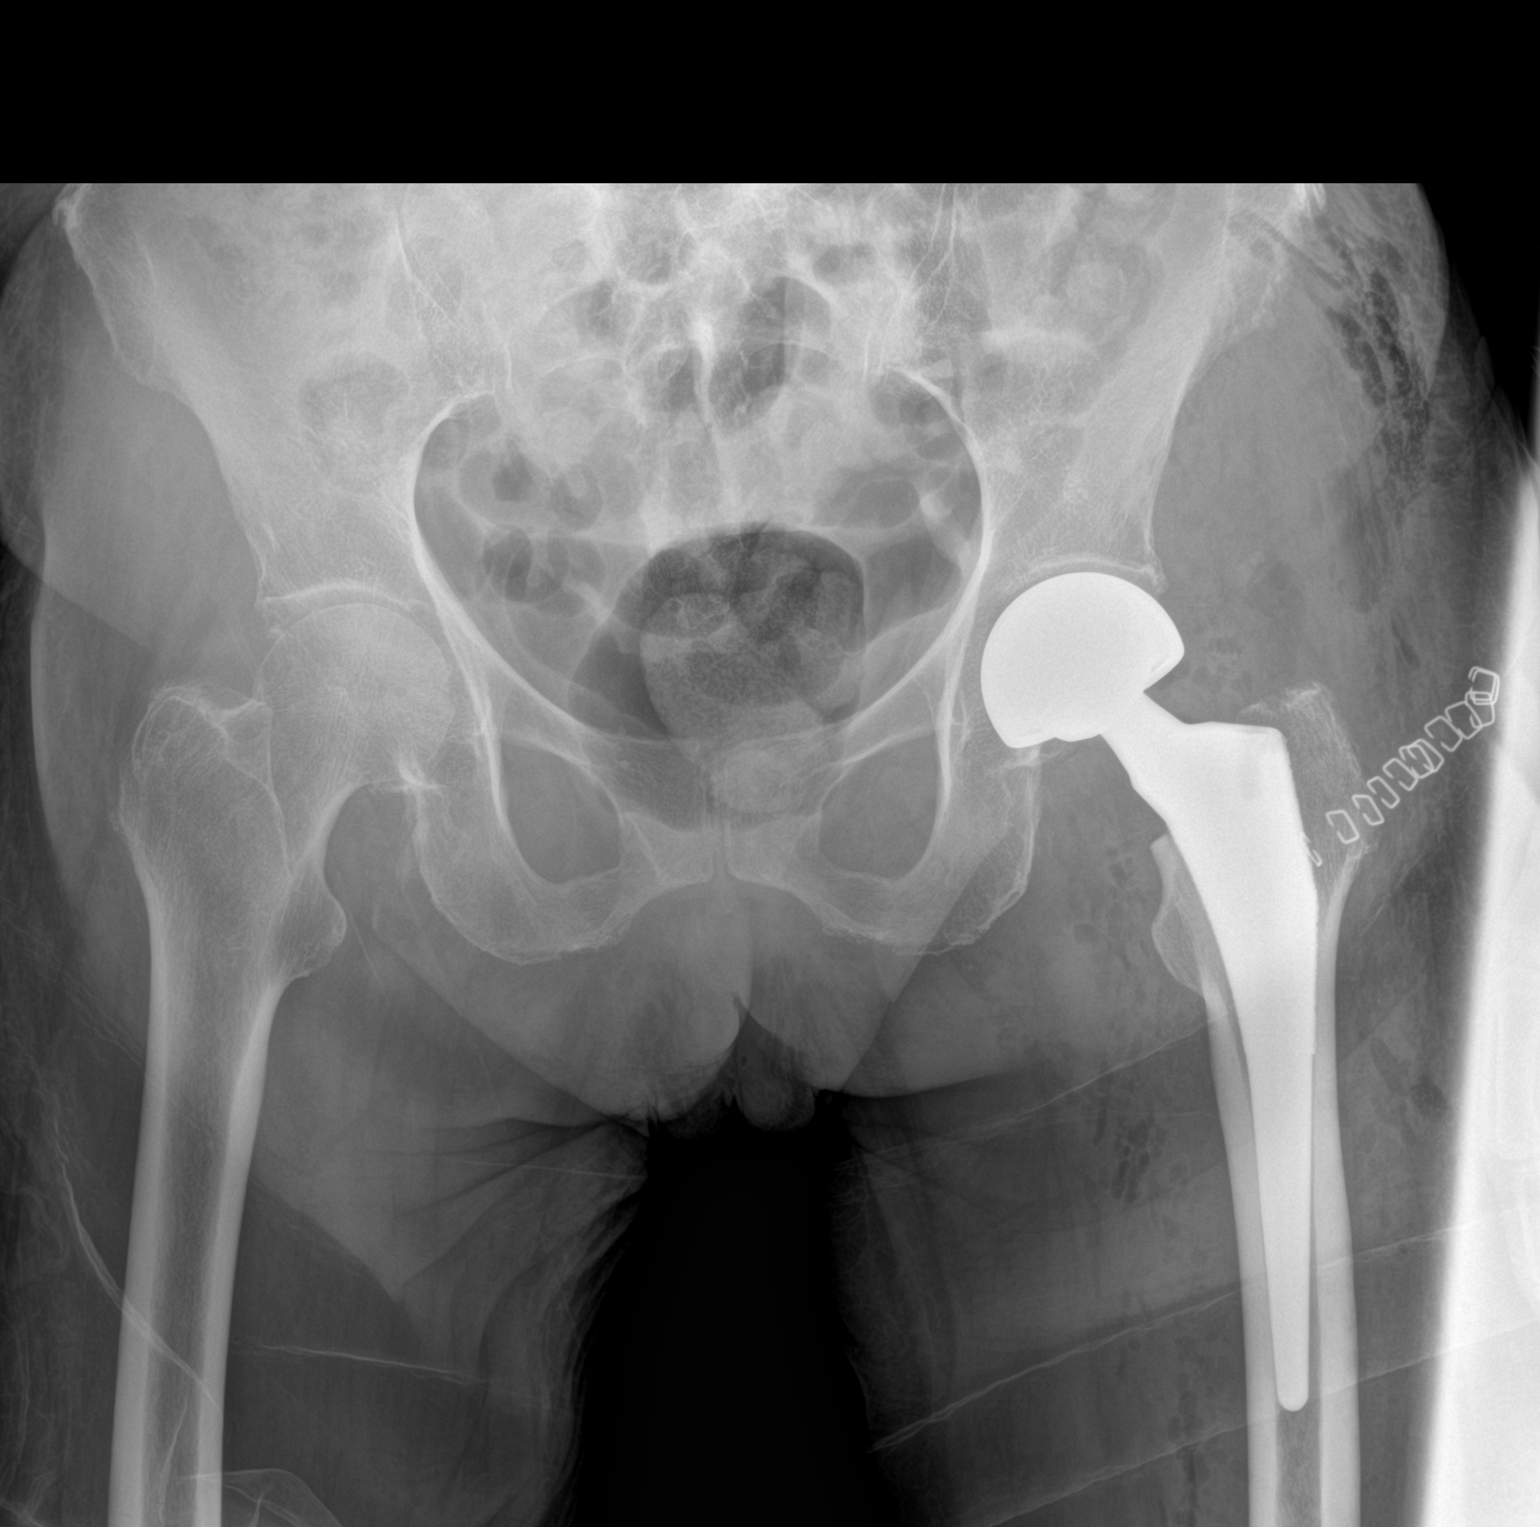

[1 of 1 positions shown; findings below may reference images not displayed]

FINDINGS: Interval postsurgical changes from left total hip arthroplasty.
Arthroplasty components appear in their expected alignment. No
periprosthetic fracture is identified. Expected postoperative
changes within the overlying soft tissues.
IMPRESSION: Expected postsurgical changes of left total hip arthroplasty.

## 2021-09-16 IMAGING — DX DG KNEE 1-2V PORT*L*
1 series · 2 of 2 positions shown · non-contrast
Comparison: Left pelvis and left hip radiographs [DATE]

CLINICAL DATA: Closed displaced fracture of the left femoral neck.

EXAM:
PORTABLE LEFT KNEE - 1-2 VIEW

[Series 1: knee · 0.14mm/px · 2 of 2 slices shown]
[im 1/2]
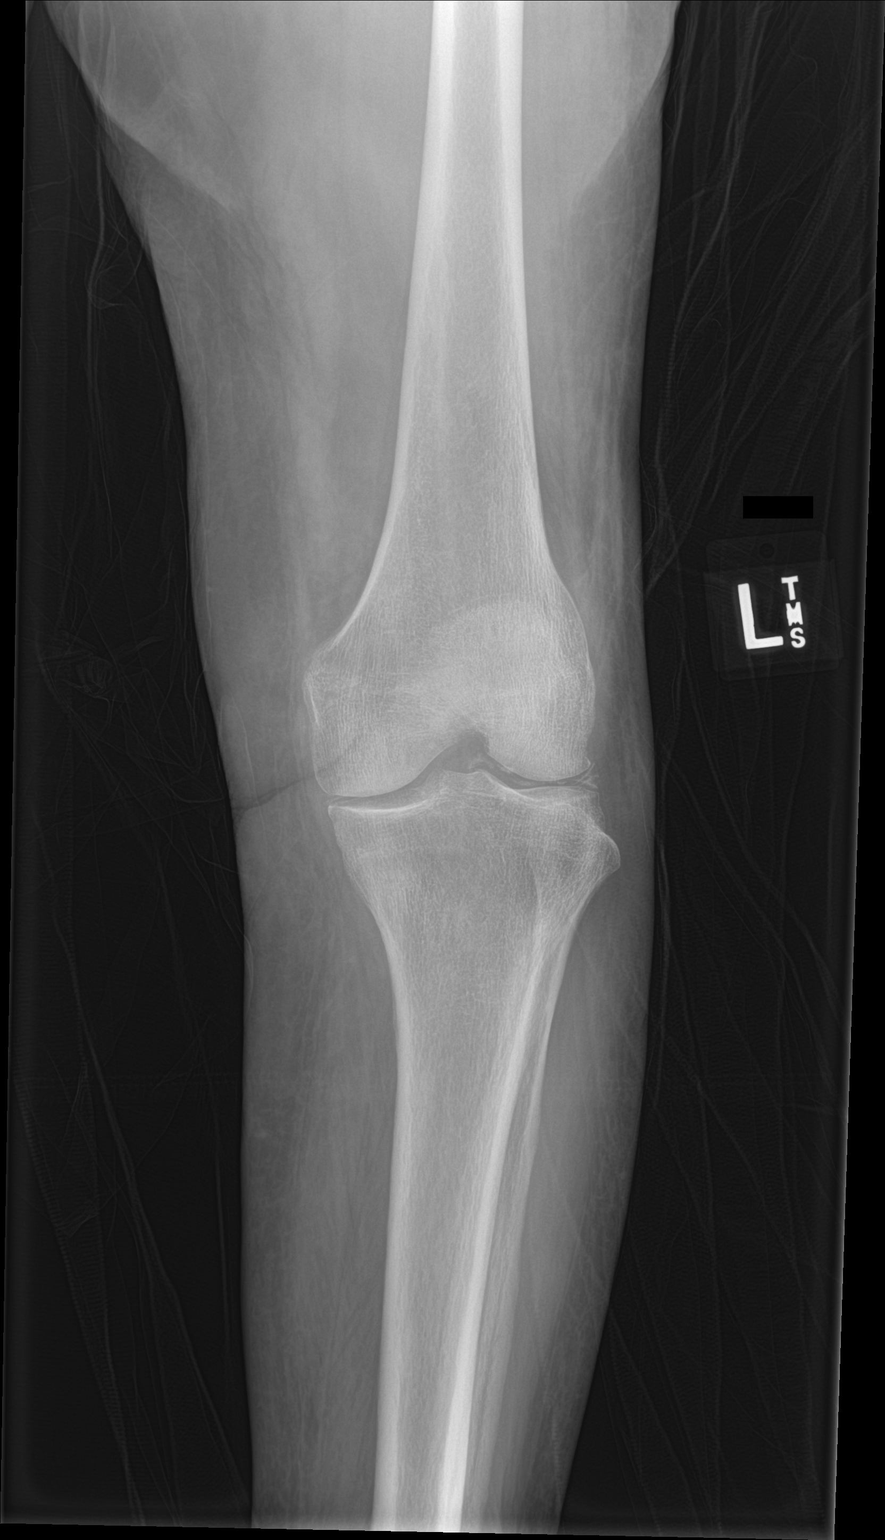
[im 2/2]
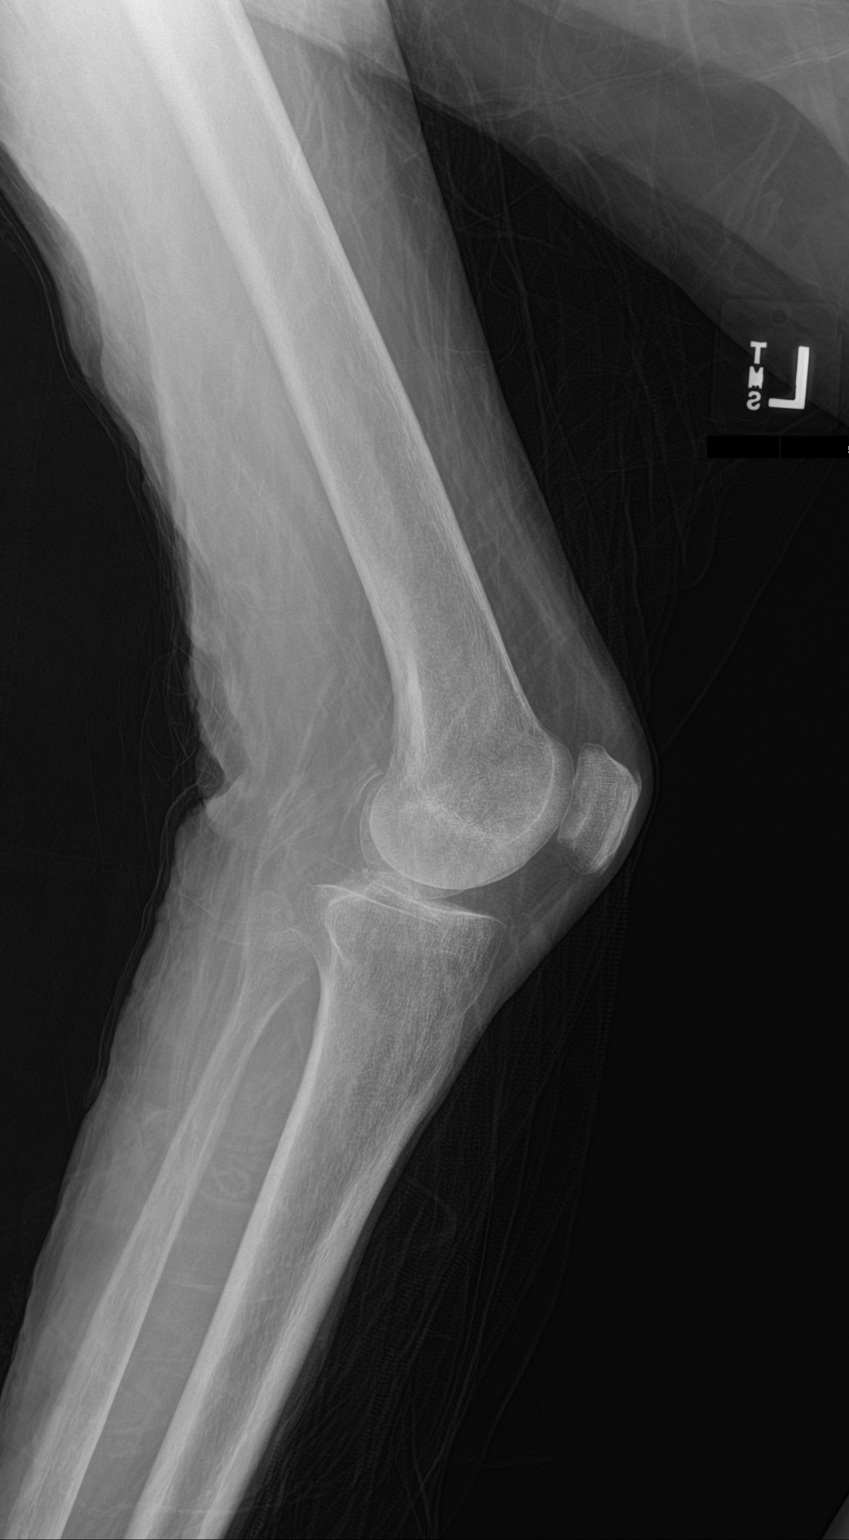

[2 of 2 positions shown; findings below may reference images not displayed]

FINDINGS: Mildly decreased bone mineralization. Moderate medial compartment
joint space narrowing. Moderate lateral and mild medial compartment
chondrocalcinosis. No joint effusion. Minimal chronic enthesopathic
change at the quadriceps insertion on the patella. No acute fracture
is seen. No dislocation.
IMPRESSION: Moderate medial compartment joint space narrowing. No acute
fracture.

## 2021-09-16 IMAGING — DX DG CHEST 1V PORT
1 series · 1 of 1 positions shown · non-contrast
Comparison: None Available.

CLINICAL DATA: Preoperative evaluation

EXAM:
PORTABLE CHEST 1 VIEW

[chest]
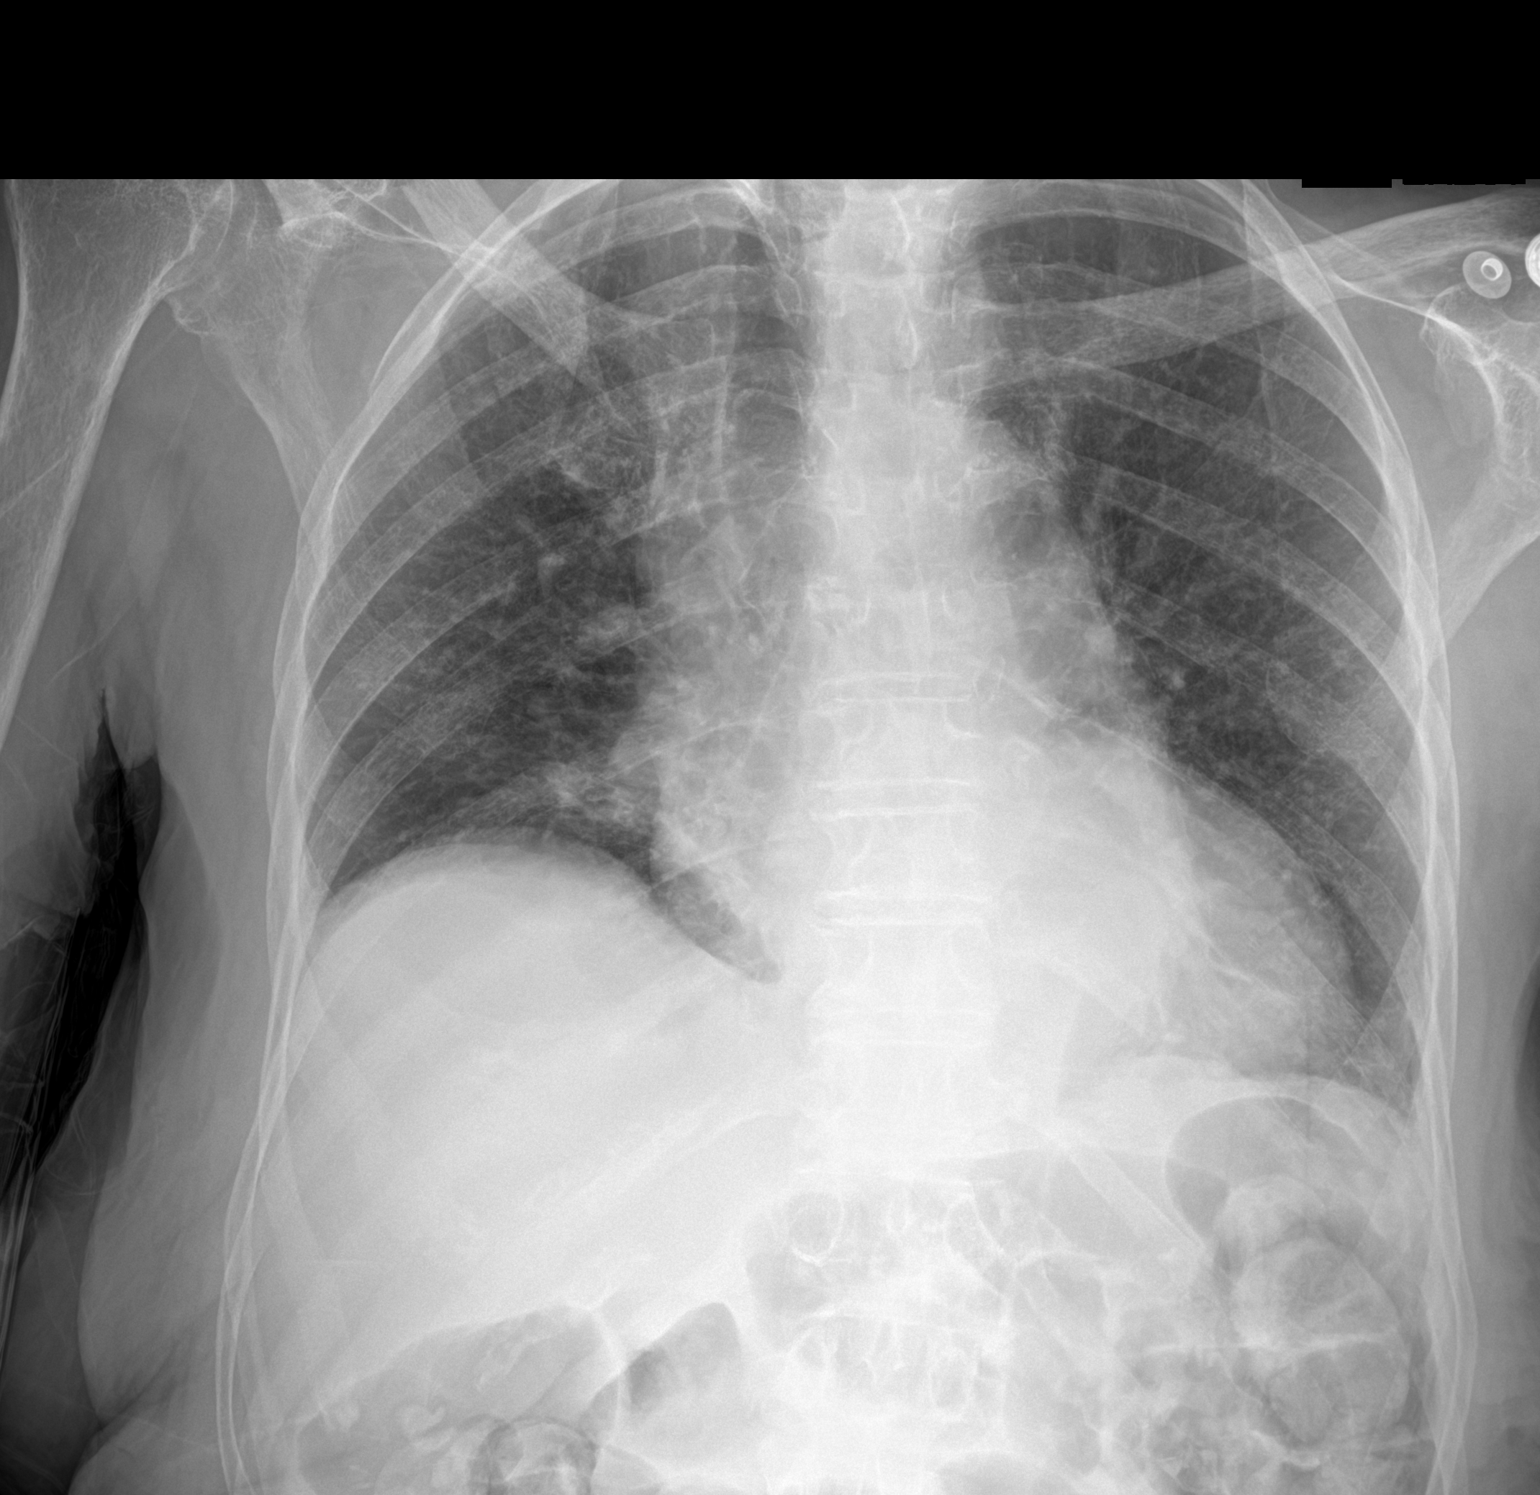

[1 of 1 positions shown; findings below may reference images not displayed]

FINDINGS: Mild interstitial changes are present. Small patchy density at the
right lung base. No pleural effusion. Cardiomediastinal contours are
probably within normal limits for technique.
IMPRESSION: Small patchy density at the right lung base reflecting atelectasis,
scarring, or consolidation. Mild interstitial changes are probably
chronic.

## 2021-09-16 IMAGING — RF DG HIP (WITH OR WITHOUT PELVIS) 1V*L*
1 series · 3 of 3 positions shown · non-contrast
Comparison: Pelvis and left femur radiographs [DATE]

CLINICAL DATA: Intraoperative fluoroscopy for anterior approach
left hip arthroplasty.

EXAM:
DG HIP (WITH OR WITHOUT PELVIS) 1V*L*

[Series 1: dg x-ray · 0.20mm/px · 3 of 3 slices shown]
[im 1/3]
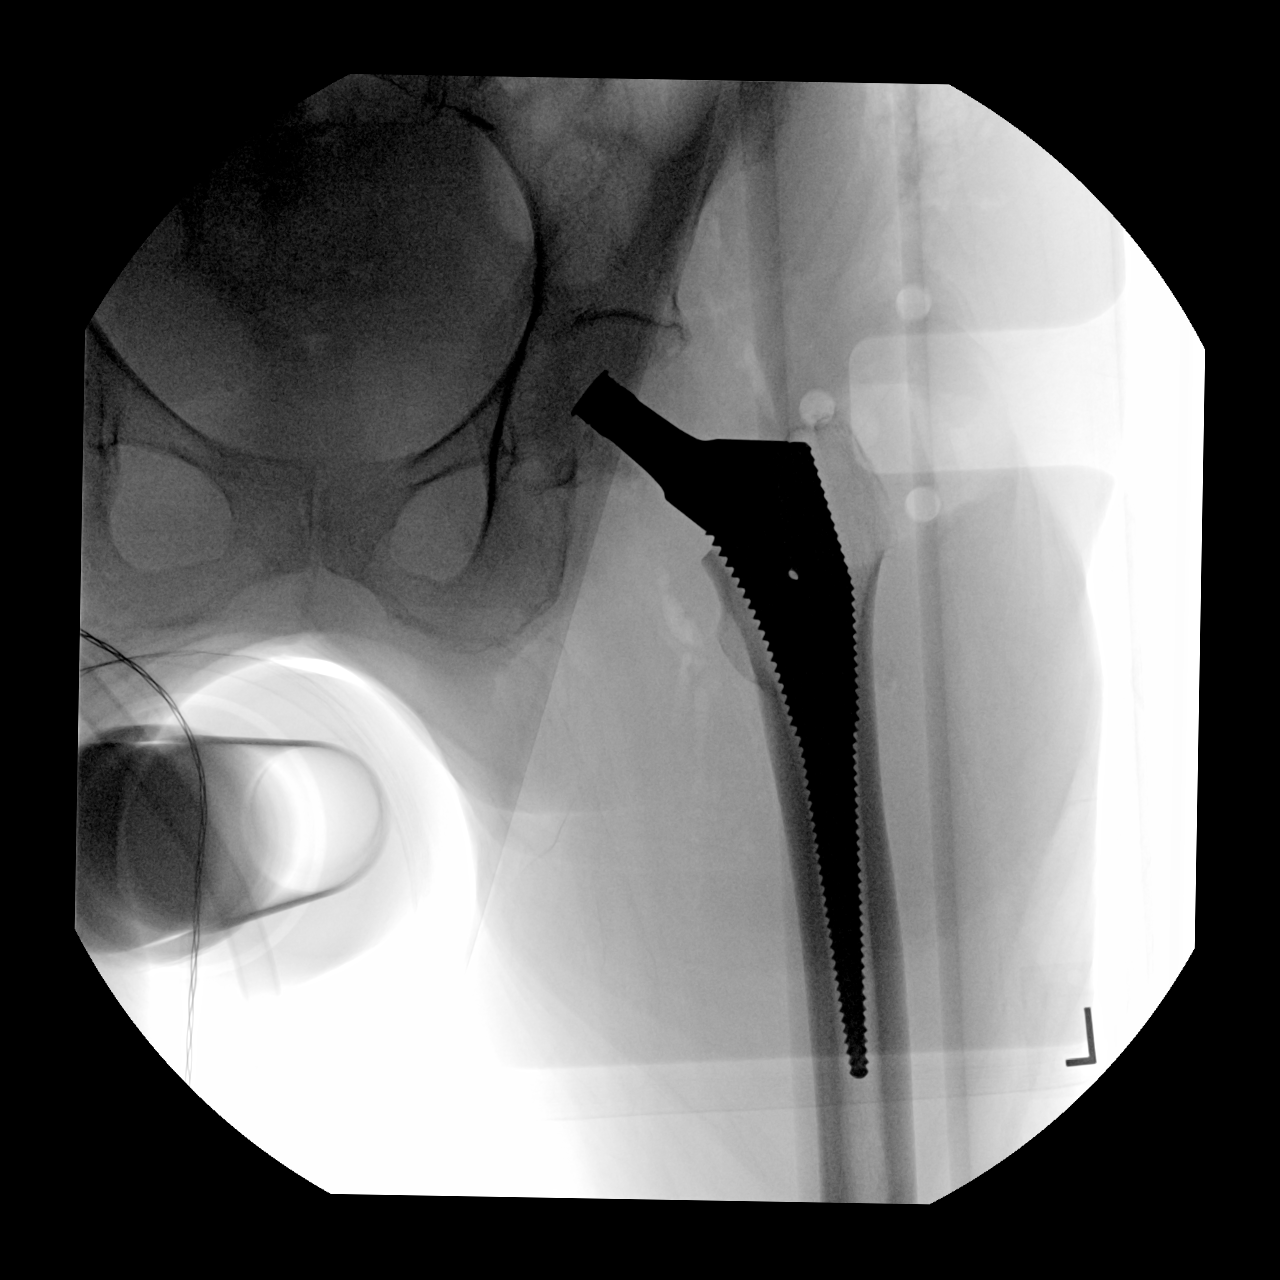
[im 2/3]
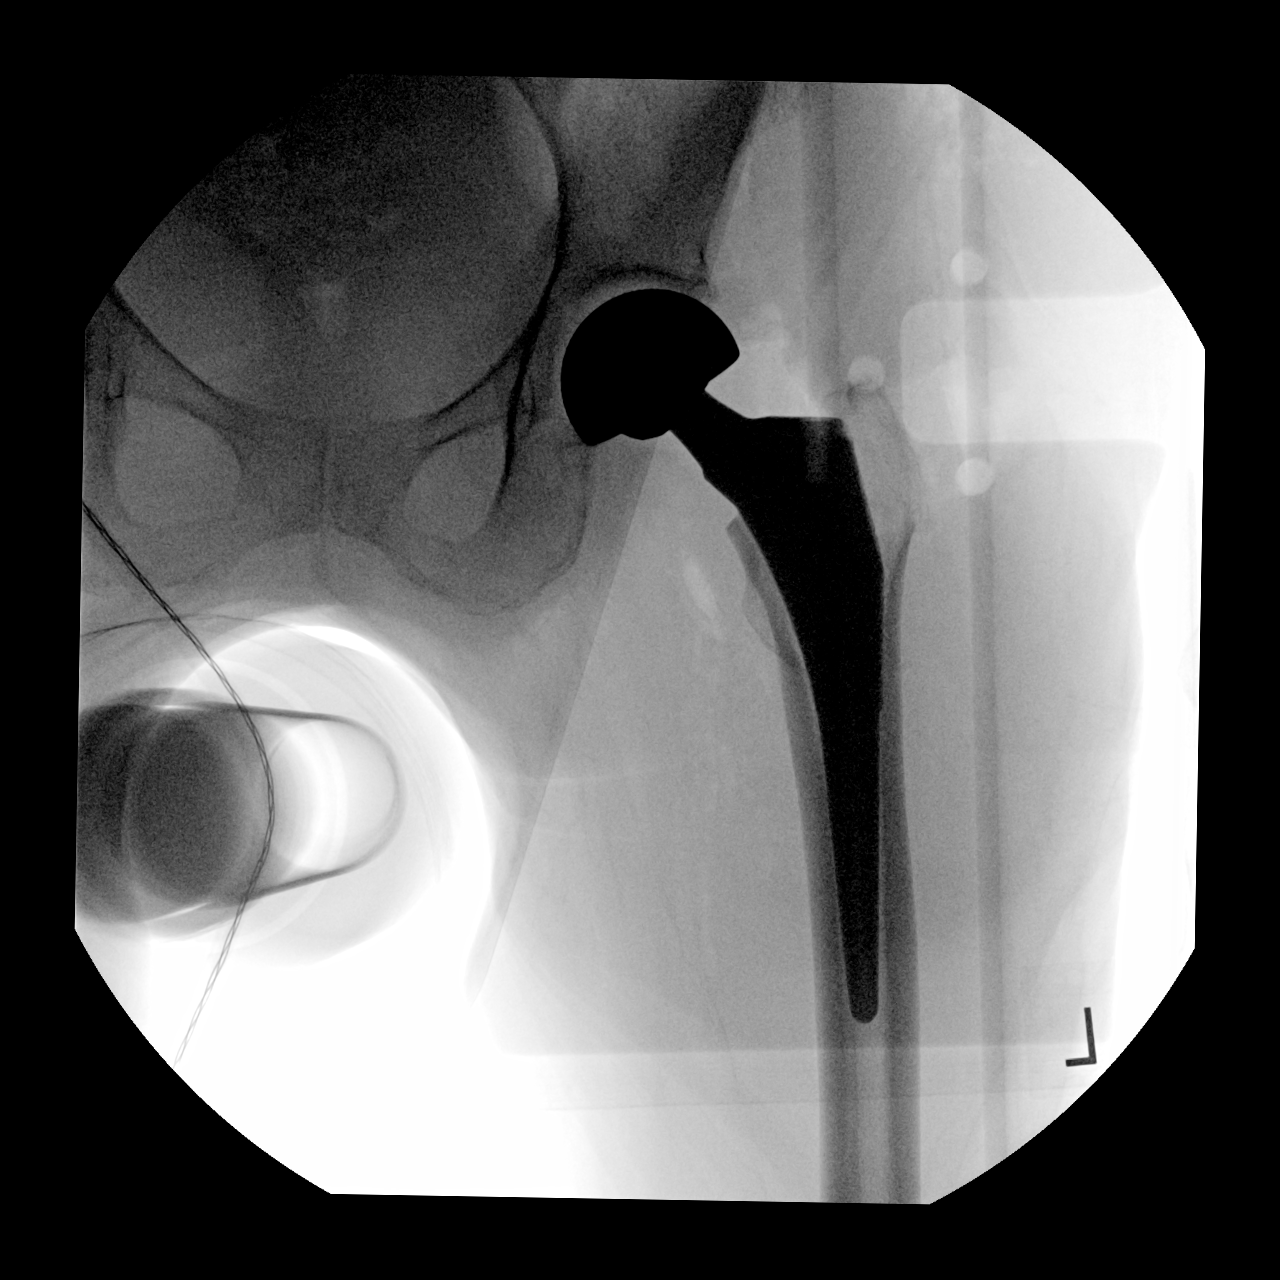
[im 3/3]
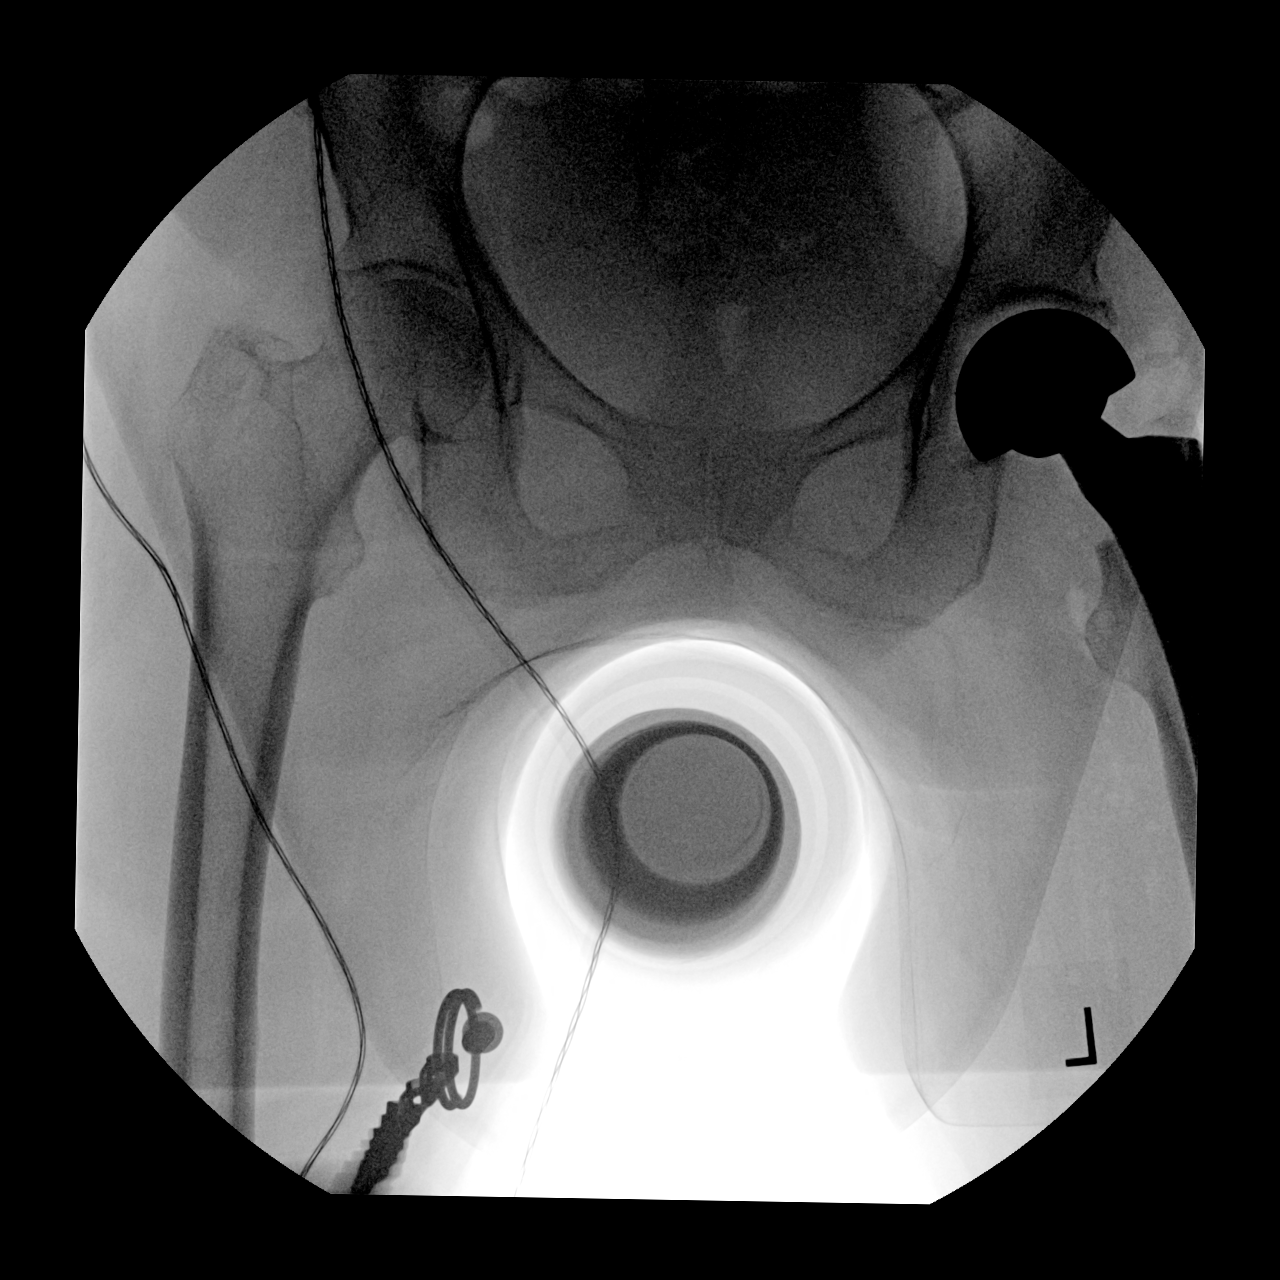

[3 of 3 positions shown; findings below may reference images not displayed]

FINDINGS: Images were performed intraoperatively without the presence of a
radiologist. The patient is undergoing total left hip arthroplasty.

Total fluoroscopy images: 3

Total fluoroscopy time: 4 seconds

Total dose: Radiation Exposure Index (as provided by the
fluoroscopic device): 0.134 mGy air Kerma

Please see intraoperative findings for further detail.
IMPRESSION: Intraoperative fluoroscopy for total left hip arthroplasty.

## 2021-09-16 SURGERY — HEMIARTHROPLASTY, HIP, DIRECT ANTERIOR APPROACH, FOR FRACTURE
Anesthesia: Monitor Anesthesia Care | Laterality: Left

## 2021-09-16 MED ORDER — SODIUM CHLORIDE 0.9 % IR SOLN
Status: DC | PRN
  Administered 2021-09-16: 3000 mL
  Administered 2021-09-16: 250 mL

## 2021-09-16 MED ORDER — TRANEXAMIC ACID-NACL 1000-0.7 MG/100ML-% IV SOLN
1000.0000 mg | INTRAVENOUS | Status: AC
  Administered 2021-09-16: 1000 mg via INTRAVENOUS

## 2021-09-16 MED ORDER — LACTATED RINGERS IV SOLN: INTRAVENOUS | Status: DC

## 2021-09-16 MED ORDER — OXYCODONE HCL 5 MG PO TABS
2.5000 mg | ORAL_TABLET | Freq: Four times a day (QID) | ORAL | Status: DC | PRN
  Administered 2021-09-18: 2.5 mg via ORAL
  Filled 2021-09-16: qty 1

## 2021-09-16 MED ORDER — ONDANSETRON HCL 4 MG PO TABS: 4.0000 mg | ORAL_TABLET | Freq: Four times a day (QID) | ORAL | Status: DC | PRN

## 2021-09-16 MED ORDER — TRANEXAMIC ACID-NACL 1000-0.7 MG/100ML-% IV SOLN
1000.0000 mg | Freq: Once | INTRAVENOUS | Status: AC
  Administered 2021-09-16: 1000 mg via INTRAVENOUS
  Filled 2021-09-16: qty 100

## 2021-09-16 MED ORDER — CHLORHEXIDINE GLUCONATE 0.12 % MT SOLN: 15.0000 mL | Freq: Once | OROMUCOSAL | Status: DC

## 2021-09-16 MED ORDER — FENTANYL CITRATE (PF) 100 MCG/2ML IJ SOLN: 25.0000 ug | INTRAMUSCULAR | Status: DC | PRN

## 2021-09-16 MED ORDER — OXYCODONE HCL 5 MG PO TABS: 5.0000 mg | ORAL_TABLET | Freq: Four times a day (QID) | ORAL | Status: DC

## 2021-09-16 MED ORDER — METOCLOPRAMIDE HCL 5 MG/ML IJ SOLN: 5.0000 mg | Freq: Three times a day (TID) | INTRAMUSCULAR | Status: DC | PRN

## 2021-09-16 MED ORDER — 0.9 % SODIUM CHLORIDE (POUR BTL) OPTIME
TOPICAL | Status: DC | PRN
  Administered 2021-09-16: 1000 mL

## 2021-09-16 MED ORDER — PROPOFOL 10 MG/ML IV BOLUS
INTRAVENOUS | Status: DC | PRN
  Administered 2021-09-16 (×5): 10 mg via INTRAVENOUS

## 2021-09-16 MED ORDER — METOCLOPRAMIDE HCL 5 MG PO TABS: 5.0000 mg | ORAL_TABLET | Freq: Three times a day (TID) | ORAL | Status: DC | PRN

## 2021-09-16 MED ORDER — ONDANSETRON HCL 4 MG/2ML IJ SOLN
INTRAMUSCULAR | Status: AC
  Filled 2021-09-16: qty 2

## 2021-09-16 MED ORDER — CHLORHEXIDINE GLUCONATE 4 % EX LIQD: 60.0000 mL | Freq: Once | CUTANEOUS | Status: DC

## 2021-09-16 MED ORDER — POVIDONE-IODINE 10 % EX SWAB: 2.0000 "application " | Freq: Once | CUTANEOUS | Status: DC

## 2021-09-16 MED ORDER — PROPOFOL 10 MG/ML IV BOLUS
INTRAVENOUS | Status: AC
  Filled 2021-09-16: qty 20

## 2021-09-16 MED ORDER — BUPIVACAINE IN DEXTROSE 0.75-8.25 % IT SOLN
INTRATHECAL | Status: DC | PRN
  Administered 2021-09-16: 1.6 mL via INTRATHECAL

## 2021-09-16 MED ORDER — OXYCODONE HCL 5 MG PO TABS: 2.5000 mg | ORAL_TABLET | Freq: Four times a day (QID) | ORAL | Status: DC | PRN

## 2021-09-16 MED ORDER — CEFAZOLIN SODIUM-DEXTROSE 2-4 GM/100ML-% IV SOLN
INTRAVENOUS | Status: AC
  Filled 2021-09-16: qty 100

## 2021-09-16 MED ORDER — BUPIVACAINE-EPINEPHRINE 0.5% -1:200000 IJ SOLN
INTRAMUSCULAR | Status: AC
  Filled 2021-09-16: qty 1

## 2021-09-16 MED ORDER — FENTANYL CITRATE (PF) 250 MCG/5ML IJ SOLN
INTRAMUSCULAR | Status: AC
  Filled 2021-09-16: qty 5

## 2021-09-16 MED ORDER — ASPIRIN 81 MG PO TBEC
81.0000 mg | DELAYED_RELEASE_TABLET | Freq: Two times a day (BID) | ORAL | Status: DC
  Administered 2021-09-16 – 2021-09-18 (×3): 81 mg via ORAL
  Filled 2021-09-16 (×3): qty 1

## 2021-09-16 MED ORDER — OXYCODONE HCL 5 MG PO TABS
2.5000 mg | ORAL_TABLET | Freq: Four times a day (QID) | ORAL | Status: DC
  Administered 2021-09-16 – 2021-09-17 (×2): 2.5 mg via ORAL
  Filled 2021-09-16 (×2): qty 1

## 2021-09-16 MED ORDER — SODIUM CHLORIDE (PF) 0.9 % IJ SOLN
INTRAMUSCULAR | Status: AC
  Filled 2021-09-16: qty 50

## 2021-09-16 MED ORDER — SODIUM CHLORIDE 0.9 % IV SOLN: INTRAVENOUS | Status: DC

## 2021-09-16 MED ORDER — ACETAMINOPHEN 500 MG PO TABS: 1000.0000 mg | ORAL_TABLET | Freq: Once | ORAL | Status: DC

## 2021-09-16 MED ORDER — ONDANSETRON HCL 4 MG/2ML IJ SOLN
INTRAMUSCULAR | Status: DC | PRN
  Administered 2021-09-16: 4 mg via INTRAVENOUS

## 2021-09-16 MED ORDER — OXYCODONE HCL 5 MG PO TABS
5.0000 mg | ORAL_TABLET | Freq: Once | ORAL | Status: AC
  Administered 2021-09-16: 5 mg via ORAL
  Filled 2021-09-16: qty 1

## 2021-09-16 MED ORDER — PHENYLEPHRINE HCL-NACL 20-0.9 MG/250ML-% IV SOLN
INTRAVENOUS | Status: DC | PRN
  Administered 2021-09-16: 50 ug/min via INTRAVENOUS

## 2021-09-16 MED ORDER — PROPOFOL 500 MG/50ML IV EMUL
INTRAVENOUS | Status: DC | PRN
  Administered 2021-09-16: 35 ug/kg/min via INTRAVENOUS

## 2021-09-16 MED ORDER — ONDANSETRON HCL 4 MG/2ML IJ SOLN: 4.0000 mg | Freq: Four times a day (QID) | INTRAMUSCULAR | Status: DC | PRN

## 2021-09-16 MED ORDER — SENNA 8.6 MG PO TABS
1.0000 | ORAL_TABLET | Freq: Two times a day (BID) | ORAL | Status: DC
  Administered 2021-09-16 – 2021-09-17 (×2): 8.6 mg via ORAL
  Filled 2021-09-16 (×2): qty 1

## 2021-09-16 MED ORDER — DOCUSATE SODIUM 100 MG PO CAPS
100.0000 mg | ORAL_CAPSULE | Freq: Two times a day (BID) | ORAL | Status: DC
  Administered 2021-09-16 – 2021-09-17 (×2): 100 mg via ORAL
  Filled 2021-09-16 (×2): qty 1

## 2021-09-16 MED ORDER — PHENOL 1.4 % MT LIQD: 1.0000 | OROMUCOSAL | Status: DC | PRN

## 2021-09-16 MED ORDER — LACTATED RINGERS IV SOLN: INTRAVENOUS | Status: DC | PRN

## 2021-09-16 MED ORDER — CHLORHEXIDINE GLUCONATE 0.12 % MT SOLN
OROMUCOSAL | Status: AC
  Filled 2021-09-16: qty 15

## 2021-09-16 MED ORDER — ORAL CARE MOUTH RINSE: 15.0000 mL | Freq: Once | OROMUCOSAL | Status: DC

## 2021-09-16 MED ORDER — ACETAMINOPHEN 500 MG PO TABS
ORAL_TABLET | ORAL | Status: AC
  Filled 2021-09-16: qty 2

## 2021-09-16 MED ORDER — TRANEXAMIC ACID-NACL 1000-0.7 MG/100ML-% IV SOLN
INTRAVENOUS | Status: AC
  Filled 2021-09-16: qty 100

## 2021-09-16 MED ORDER — CEFAZOLIN SODIUM-DEXTROSE 2-4 GM/100ML-% IV SOLN
2.0000 g | Freq: Four times a day (QID) | INTRAVENOUS | Status: AC
  Administered 2021-09-16 – 2021-09-17 (×2): 2 g via INTRAVENOUS
  Filled 2021-09-16 (×2): qty 100

## 2021-09-16 MED ORDER — IRRISEPT - 450ML BOTTLE WITH 0.05% CHG IN STERILE WATER, USP 99.95% OPTIME
TOPICAL | Status: DC | PRN
  Administered 2021-09-16: 450 mL

## 2021-09-16 MED ORDER — KETOROLAC TROMETHAMINE 30 MG/ML IJ SOLN
INTRAMUSCULAR | Status: AC
Start: 2021-09-16 — End: ?
  Filled 2021-09-16: qty 1

## 2021-09-16 MED ORDER — MENTHOL 3 MG MT LOZG: 1.0000 | LOZENGE | OROMUCOSAL | Status: DC | PRN

## 2021-09-16 MED ORDER — SODIUM CHLORIDE (PF) 0.9 % IJ SOLN
INTRAMUSCULAR | Status: DC | PRN
  Administered 2021-09-16: 61 mL

## 2021-09-16 MED ORDER — CEFAZOLIN SODIUM-DEXTROSE 2-4 GM/100ML-% IV SOLN
2.0000 g | INTRAVENOUS | Status: AC
  Administered 2021-09-16: 2 g via INTRAVENOUS

## 2021-09-16 SURGICAL SUPPLY — 68 items
ALCOHOL 70% 16 OZ (MISCELLANEOUS) ×2 IMPLANT
BAG COUNTER SPONGE SURGICOUNT (BAG) ×2 IMPLANT
BLADE CLIPPER SURG (BLADE) IMPLANT
CHLORAPREP W/TINT 26 (MISCELLANEOUS) ×2 IMPLANT
COVER SURGICAL LIGHT HANDLE (MISCELLANEOUS) ×2 IMPLANT
DERMABOND ADVANCED (GAUZE/BANDAGES/DRESSINGS) ×1
DERMABOND ADVANCED .7 DNX12 (GAUZE/BANDAGES/DRESSINGS) ×2 IMPLANT
DRAPE C-ARM 42X72 X-RAY (DRAPES) ×2 IMPLANT
DRAPE STERI IOBAN 125X83 (DRAPES) ×2 IMPLANT
DRAPE U-SHAPE 47X51 STRL (DRAPES) ×6 IMPLANT
DRSG AQUACEL AG ADV 3.5X10 (GAUZE/BANDAGES/DRESSINGS) ×2 IMPLANT
ELECT BLADE 4.0 EZ CLEAN MEGAD (MISCELLANEOUS) ×2
ELECT PENCIL ROCKER SW 15FT (MISCELLANEOUS) ×2 IMPLANT
ELECT REM PT RETURN 9FT ADLT (ELECTROSURGICAL) ×2
ELECTRODE BLDE 4.0 EZ CLN MEGD (MISCELLANEOUS) ×1 IMPLANT
ELECTRODE REM PT RTRN 9FT ADLT (ELECTROSURGICAL) ×1 IMPLANT
EVACUATOR 1/8 PVC DRAIN (DRAIN) IMPLANT
GLOVE BIO SURGEON STRL SZ8.5 (GLOVE) ×4 IMPLANT
GLOVE BIOGEL M 7.0 STRL (GLOVE) ×2 IMPLANT
GLOVE BIOGEL PI IND STRL 7.5 (GLOVE) ×1 IMPLANT
GLOVE BIOGEL PI IND STRL 8.5 (GLOVE) ×1 IMPLANT
GLOVE BIOGEL PI INDICATOR 7.5 (GLOVE) ×1
GLOVE BIOGEL PI INDICATOR 8.5 (GLOVE) ×1
GOWN STRL REUS W/ TWL LRG LVL3 (GOWN DISPOSABLE) ×2 IMPLANT
GOWN STRL REUS W/ TWL XL LVL3 (GOWN DISPOSABLE) ×1 IMPLANT
GOWN STRL REUS W/TWL 2XL LVL3 (GOWN DISPOSABLE) ×2 IMPLANT
GOWN STRL REUS W/TWL LRG LVL3 (GOWN DISPOSABLE) ×4
GOWN STRL REUS W/TWL XL LVL3 (GOWN DISPOSABLE) ×2
HANDPIECE INTERPULSE COAX TIP (DISPOSABLE) ×2
HEAD MOD COCR 28MM HD -6MM NK (Orthopedic Implant) ×1 IMPLANT
HOOD PEEL AWAY FACE SHEILD DIS (HOOD) ×4 IMPLANT
JET LAVAGE IRRISEPT WOUND (IRRIGATION / IRRIGATOR) ×2
KIT BASIN OR (CUSTOM PROCEDURE TRAY) ×2 IMPLANT
KIT TURNOVER KIT B (KITS) ×2 IMPLANT
LAVAGE JET IRRISEPT WOUND (IRRIGATION / IRRIGATOR) ×1 IMPLANT
MANIFOLD NEPTUNE II (INSTRUMENTS) ×2 IMPLANT
MARKER SKIN DUAL TIP RULER LAB (MISCELLANEOUS) ×4 IMPLANT
NDL SPNL 18GX3.5 QUINCKE PK (NEEDLE) ×1 IMPLANT
NEEDLE SPNL 18GX3.5 QUINCKE PK (NEEDLE) ×2 IMPLANT
NS IRRIG 1000ML POUR BTL (IV SOLUTION) ×2 IMPLANT
PACK TOTAL JOINT (CUSTOM PROCEDURE TRAY) ×2 IMPLANT
PACK UNIVERSAL I (CUSTOM PROCEDURE TRAY) ×2 IMPLANT
PAD ARMBOARD 7.5X6 YLW CONV (MISCELLANEOUS) ×4 IMPLANT
RINGBLOC BI POLAR 28X41MM (Orthopedic Implant) ×1 IMPLANT
SAW OSC TIP CART 19.5X105X1.3 (SAW) ×2 IMPLANT
SEALER BIPOLAR AQUA 6.0 (INSTRUMENTS) ×1 IMPLANT
SET HNDPC FAN SPRY TIP SCT (DISPOSABLE) ×1 IMPLANT
SOL PREP POV-IOD 4OZ 10% (MISCELLANEOUS) ×2 IMPLANT
STAPLER VISISTAT 35W (STAPLE) ×1 IMPLANT
STEM FEM CMTLS HIGH OS 8X136 (Stem) IMPLANT
STEM FEM CMTLS HO 8X136 (Stem) ×2 IMPLANT
SUT ETHIBOND NAB CT1 #1 30IN (SUTURE) ×4 IMPLANT
SUT MNCRL AB 3-0 PS2 18 (SUTURE) ×2 IMPLANT
SUT MON AB 2-0 CT1 36 (SUTURE) ×2 IMPLANT
SUT VIC AB 0 CTX 36 (SUTURE) ×2
SUT VIC AB 0 CTX36XBRD ANTBCTR (SUTURE) IMPLANT
SUT VIC AB 1 CT1 27 (SUTURE) ×2
SUT VIC AB 1 CT1 27XBRD ANBCTR (SUTURE) ×1 IMPLANT
SUT VIC AB 2-0 CT1 27 (SUTURE) ×2
SUT VIC AB 2-0 CT1 TAPERPNT 27 (SUTURE) ×1 IMPLANT
SUT VLOC 180 0 24IN GS25 (SUTURE) ×2 IMPLANT
SYR 50ML LL SCALE MARK (SYRINGE) ×2 IMPLANT
TOWEL GREEN STERILE (TOWEL DISPOSABLE) ×2 IMPLANT
TOWEL GREEN STERILE FF (TOWEL DISPOSABLE) ×2 IMPLANT
TRAY CATH 16FR W/PLASTIC CATH (SET/KITS/TRAYS/PACK) IMPLANT
TRAY FOLEY W/BAG SLVR 16FR (SET/KITS/TRAYS/PACK)
TRAY FOLEY W/BAG SLVR 16FR ST (SET/KITS/TRAYS/PACK) IMPLANT
WATER STERILE IRR 1000ML POUR (IV SOLUTION) ×6 IMPLANT

## 2021-09-16 NOTE — Progress Notes (Signed)
Daily Progress Note Intern Pager: [phone]  Patient name: Tracy [name] Medical record number: [id number] Date of birth: [birth year] Age: 88 y.o. Gender: female  Primary Care Provider: Charlane Ferretti, MD Consultants: Orthopedic surgery Code Status: DNR/DNR  Pt Overview and Major Events to Date:  6/11: Admitted 6/12: To OR for left hemiarthroplasty  Assessment and Plan:  Tracy [name] is a 88 year old female presenting with femoral neck fracture after fall from standing without loss of consciousness nor head trauma. Pertinent PMH/PSH includes multiple strokes complicated by vascular dementia, CKD stage IV, HTN, and HLD.   * Displaced fracture of left femoral neck ([place]) Patient presented with mechanical fall from standing as she turned around to walk without her walker.  X-ray of left femur in the ED with displaced fracture in the neck of the proximal left femur. Orthopedics consulted in the ED who advised that patient will require hemiarthroplasty; appreciate assistance in patient's care. - Plan for left hemiarthroplasty today, per orthopedics -We will follow-up Ortho recs postoperatively - Pain control with Roxicodone 5 mg every 6 hours, 2.5 mg every 6 hours as needed breakthrough pain, and Tylenol 650 mg every 6 hours -Fall precautions -Delirium precautions  Essential hypertension Severely elevated, asymptomatic.  Likely secondary to medication nonadherence (did not take her antihypertensive yesterday AM PTA) and pain. - Hold home losartan 25 mg for planned surgery today  Chronic kidney disease, stage 4 (severe) (HCC) Avoid nephrotoxic agents.  Will obtain VQ scan in light of renal function supports to prevent contrast from CT scan.  Fall Presented with a mechanical fall from standing after tripping while turning around from walker.  Hypoxia Patient was not hypoxic on admission.  Took 1 dose of fentanyl in ED, and desat to 79% approximately 20 minutes after medication.   Restored sats with 2 LNC.  Suspect due to fentanyl dose, but in light of patient's age and long bone fracture, want to rule out fat embolus as a source of hypoxia. - VQ scan pending in light of patient's renal function, would prefer not to order CT scan and introduce contrast.   Chronic conditions for which Home medications resumed: Gout-allopurinol 200 mg daily History of CVA HLD-aspirin 81 mg and Lipitor 20 mg HTN-losartan 25 mg Dementia-memantine 10 mg GERD-Protonix 40 mg   FEN/GI: Regular diet to resume after procedure PPx: SQ heparin Dispo:Pending PT recommendations  pending clinical improvement . Barriers include post-op recommendations and PT/OT evaluation.   Subjective:  Patient reports no pain when lying still, but significant pain when being moved or moving.  She reports no chest pain, nausea/vomiting, dyspnea, nor difficulties voiding.  She reports no acute concerns or complaints today.  Objective: Temp:  [97.8 F (36.6 C)-98.8 F (37.1 C)] 98.8 F (37.1 C) (06/12 1135) Pulse Rate:  [88-102] 95 (06/12 1135) Resp:  [13-33] 16 (06/12 1135) BP: (156-209)/(80-116) 188/97 (06/12 1135) SpO2:  [93 %-100 %] 93 % (06/12 1135) Weight:  [92 lb 2.4 oz (41.8 kg)] 92 lb 2.4 oz (41.8 kg) (06/11 2036) Physical Exam: General: Well-appearing but frail elderly female lying in bed in NAD Cardiovascular: RRR, no murmur/rub/gallops Respiratory: CTA x 2; normal WOB on RA Abdomen: Soft, NT/ND Extremities: Pain with minimal movement of LLE; nonedematous BLE  Laboratory: Most recent CBC Lab Results  Component Value Date   WBC 15.1 (H) 09/16/2021   HGB 11.7 (L) 09/16/2021   HCT 36.4 09/16/2021   MCV 89.7 09/16/2021   PLT 233 09/16/2021   Most recent BMP  Latest Ref Rng & Units 09/16/2021    4:30 AM  BMP  Glucose 70 - 99 mg/dL 123   BUN 8 - 23 mg/dL 24   Creatinine 0.44 - 1.00 mg/dL 1.92   Sodium 135 - 145 mmol/L 141   Potassium 3.5 - 5.1 mmol/L 3.9   Chloride 98 - 111 mmol/L  107   CO2 22 - 32 mmol/L 27   Calcium 8.9 - 10.3 mg/dL 8.7       Imaging/Diagnostic Tests: No new images to review today  Rosezetta Schlatter, MD 09/16/2021, 12:49 PM  PGY-1, [place] Intern pager: [phone], text pages welcome Secure chat group [place]

## 2021-09-16 NOTE — Interval H&P Note (Signed)
History and Physical Interval Note:  09/16/2021 12:06 PM  Tracy [name]  has presented today for surgery, with the diagnosis of LEFT HIP FRACTURE.  The various methods of treatment have been discussed with the patient and family. After consideration of risks, benefits and other options for treatment, the patient has consented to  Procedure(s): ANTERIOR APPROACH HEMI HIP ARTHROPLASTY (Left) as a surgical intervention.  The patient's history has been reviewed, patient examined, no change in status, stable for surgery.  I have reviewed the patient's chart and labs.  Questions were answered to the patient's satisfaction.    The risks, benefits, and alternatives were discussed with the patient/niece. There are risks associated with the surgery including, but not limited to, problems with anesthesia (death), infection, instability (giving out of the joint), dislocation, differences in leg length/angulation/rotation, fracture of bones, loosening or failure of implants, hematoma (blood accumulation) which may require surgical drainage, blood clots, pulmonary embolism, nerve injury (foot drop and lateral thigh numbness), and blood vessel injury. The patient/niece understand these risks and elects to proceed.    Hilton Cork Julien Berryman

## 2021-09-16 NOTE — Anesthesia Procedure Notes (Signed)
Procedure Name: MAC Date/Time: 09/16/2021 12:27 PM  Performed by: Inda Coke, CRNAPre-anesthesia Checklist: Patient identified, Emergency Drugs available, Suction available, Timeout performed and Patient being monitored Patient Re-evaluated:Patient Re-evaluated prior to induction Oxygen Delivery Method: Simple face mask Induction Type: IV induction Dental Injury: Teeth and Oropharynx as per pre-operative assessment

## 2021-09-16 NOTE — Anesthesia Preprocedure Evaluation (Addendum)
Anesthesia Evaluation  Patient identified by MRN, date of birth, ID band Patient confused  General Assessment Comment:awake  Reviewed: Allergy & Precautions, NPO status , Patient's Chart, lab work & pertinent test results  Airway Mallampati: III  TM Distance: >3 FB Neck ROM: Full  Mouth opening: Limited Mouth Opening  Dental  (+) Dental Advisory Given, Teeth Intact,    Pulmonary neg pulmonary ROS,    breath sounds clear to auscultation       Cardiovascular hypertension, Pt. on medications (-) angina Rhythm:Regular + Systolic murmurs    Neuro/Psych CVA negative psych ROS   GI/Hepatic negative GI ROS, Neg liver ROS,   Endo/Other    Renal/GU CRFRenal diseaseLab Results      Component                Value               Date                      CREATININE               1.92 (H)            09/16/2021                Musculoskeletal LEFT HIP FRACTURE   Abdominal   Peds  Hematology  (+) Blood dyscrasia, anemia , Lab Results      Component                Value               Date                      WBC                      15.1 (H)            09/16/2021                HGB                      11.7 (L)            09/16/2021                HCT                      36.4                09/16/2021                MCV                      89.7                09/16/2021                PLT                      233                 09/16/2021            No results found for: "PTT" Lab Results      Component                Value  Date                      INR                      1.0                 09/15/2021              Anesthesia Other Findings   Reproductive/Obstetrics                            Anesthesia Physical Anesthesia Plan  ASA: 3  Anesthesia Plan: MAC and Spinal   Post-op Pain Management:    Induction:   PONV Risk Score and Plan: 2 and Treatment may vary due to age or  medical condition  Airway Management Planned: Nasal Cannula and Natural Airway  Additional Equipment:   Intra-op Plan:   Post-operative Plan:   Informed Consent:     Dental advisory given  Plan Discussed with: CRNA  Anesthesia Plan Comments:        Anesthesia Quick Evaluation

## 2021-09-16 NOTE — Op Note (Signed)
OPERATIVE REPORT  SURGEON: Rod Can, MD   ASSISTANT: Larene Pickett, PA-C  PREOPERATIVE DIAGNOSIS: Displaced Left femoral neck fracture.   POSTOPERATIVE DIAGNOSIS: Displaced Left femoral neck fracture.   PROCEDURE: Left hip hemiarthroplasty, anterior approach.   IMPLANTS: Biomet Taperloc Complete Reduced Distal stem, size 8 x 136 mm, high offset, with a 41 x 28 mm bipolar bearing and a 28 - 6 mm metal head ball.  ANESTHESIA:  MAC, Regional, and Spinal  ANTIBIOTICS: 2g ancef.  ESTIMATED BLOOD LOSS:-150 mL    DRAINS: None.  COMPLICATIONS: None   CONDITION: PACU - hemodynamically stable.   BRIEF CLINICAL NOTE: Tracy [name] is a 88 y.o. female with a displaced Left femoral neck fracture. The patient was admitted to the hospitalist service and underwent perioperative risk stratification and medical optimization. The risks, benefits, and alternatives to hemiarthroplasty were explained, and the patient elected to proceed.  PROCEDURE IN DETAIL: The patient was taken to the operating room and general anesthesia was induced on the hospital bed.  The patient was then positioned on the Hana table.  All bony prominences were well padded.  The hip was prepped and draped in the normal sterile surgical fashion.  A time-out was called verifying side and site of surgery. Antibiotics were given within 60 minutes of beginning the procedure.   Bikini incision was made, and the direct anterior approach to the hip was performed through the Hueter interval.  Superficial dissection was carried out lateral to the ASIS. Lateral femoral circumflex vessels were treated with the Auqumantys. The anterior capsule was exposed and an inverted T capsulotomy was made. Fracture hematoma was encountered and evacuated. The patient was found to have a comminuted Left subcapital femoral neck fracture.  Inferior pubofemoral ligament was released subperiosteally to the lesser trochanter. I freshened the femoral neck cut  with a saw.  I removed the femoral neck fragment.  A corkscrew was placed into the head and the head was removed.  This was passed to the back table and was measured.   Acetabular exposure was achieved.  I examined the articular cartilage which was intact.  The labrum was intact. A 41 mm trial head was placed and found to have excellent fit.   I then gained femoral exposure taking care to protect the abductors and greater trochanter.  The superior capsule was incised longitudinally, staying lateral to the posterior border of the femoral neck. External rotation, extension, and adduction were applied.  A cookie cutter was used to enter the femoral canal, and then the femoral canal finder was used to confirm location.  I then sequentially broached up to a size 8.  Calcar planer was used on the femoral neck remnant.  I placed a high offset neck and a 41 x 28 - 6 mm mm bipolar construct. The hip was reduced.  Leg lengths were checked fluoroscopically.  The hip was dislocated and trial components were removed.  I placed the real stem followed by the real spacer and head ball.  A single reduction maneuver was performed and the hip was reduced.  Fluoroscopy was used to confirm component position and leg lengths.  At 90 degrees of external rotation and extension, the hip was stable to an anterior directed force.   The wound was copiously irrigated with Irrisept solution and normal saline using pulse lavage.  Marcaine solution was injected into the periarticular soft tissue.  The wound was closed in layers using #1 Stratafix for the fascia, 2-0 Vicryl for the subcutaneous fat,  2-0 Monocryl for the deep dermal layer, and staples + Dermabond for the skin.  Once the glue was fully dried, an Aquacell Ag dressing was applied.  The patient was then awakened from anesthesia and transported to the recovery room in stable condition.  Sponge, needle, and instrument counts were correct at the end of the case x2.  The patient  tolerated the procedure well and there were no known complications.  Please note that a surgical assistant was a medical necessity for this procedure to perform it in a safe and expeditious manner. Assistant was necessary to provide appropriate retraction of vital neurovascular structures, to prevent femoral fracture, and to allow for anatomic placement of the prosthesis.

## 2021-09-16 NOTE — Progress Notes (Signed)
Patient's niece was asking for MD to call her today before surgery is done. She said, she has questions about the procedure. Her name is [name] and her # [id number].

## 2021-09-16 NOTE — Anesthesia Procedure Notes (Signed)
Spinal  Patient location during procedure: OR Start time: 09/16/2021 12:42 PM End time: 09/16/2021 12:50 PM Reason for block: surgical anesthesia Staffing Performed: anesthesiologist  Anesthesiologist: Oleta Mouse, MD Performed by: Oleta Mouse, MD Authorized by: Oleta Mouse, MD   Preanesthetic Checklist Completed: patient identified, IV checked, risks and benefits discussed, surgical consent, monitors and equipment checked, pre-op evaluation and timeout performed Spinal Block Patient position: left lateral decubitus Prep: DuraPrep Patient monitoring: heart rate, cardiac monitor, continuous pulse ox and blood pressure Approach: midline Location: L4-5 Injection technique: single-shot Needle Needle type: Pencan  Needle gauge: 24 G Needle length: 9 cm Assessment Sensory level: T6 Events: CSF return

## 2021-09-16 NOTE — Plan of Care (Signed)
  Problem: Nutrition: Goal: Adequate nutrition will be maintained Outcome: Progressing   Problem: Pain Managment: Goal: General experience of comfort will improve Outcome: Progressing   Problem: Safety: Goal: Ability to remain free from injury will improve Outcome: Progressing   

## 2021-09-16 NOTE — H&P (View-Only) (Signed)
Reason for Consult:Left hip fx Referring Physician: Todd McDiarmid Time called: [id number] Time at bedside: Tracy [name] is an 88 y.o. female.  HPI: [name] was at church yesterday and forgot her RW. When she turned around to go without it she lost her balance and fell. She had immediate left hip pain and could not get up. She was brought to the ED where x-rays showed a left hip fx and orthopedic surgery was consulted. She is a little confused this morning.  Past Medical History:  Diagnosis Date   Anemia due to stage 4 chronic kidney disease ([place]) 01/30/2021   Chronic kidney disease, stage 4 (severe) ([place]) 01/30/2021   Essential hypertension 01/30/2021   Mixed hyperlipidemia 01/30/2021   Occipital stroke ([place]) 2022    History reviewed. No pertinent surgical history.  Family History  Problem Relation Age of Onset   Cancer Mother    Dementia Sister    Heart disease Neg Hx     Social History:  reports that she has never smoked. She has never used smokeless tobacco. She reports current alcohol use. She reports that she does not use drugs.  Allergies: No Known Allergies  Medications: I have reviewed the patient's current medications.  Results for orders placed or performed during the hospital encounter of 09/15/21 (from the past 48 hour(s))  CBC with Differential     Status: Abnormal   Collection Time: 09/15/21 11:18 AM  Result Value Ref Range   WBC 10.9 (H) 4.0 - 10.5 K/uL   RBC 3.95 3.87 - 5.11 MIL/uL   Hemoglobin 11.6 (L) 12.0 - 15.0 g/dL   HCT 35.3 (L) 36.0 - 46.0 %   MCV 89.4 80.0 - 100.0 fL   MCH 29.4 26.0 - 34.0 pg   MCHC 32.9 30.0 - 36.0 g/dL   RDW 13.4 11.5 - 15.5 %   Platelets 255 150 - 400 K/uL   nRBC 0.0 0.0 - 0.2 %   Neutrophils Relative % 84 %   Neutro Abs 9.1 (H) 1.7 - 7.7 K/uL   Lymphocytes Relative 11 %   Lymphs Abs 1.2 0.7 - 4.0 K/uL   Monocytes Relative 4 %   Monocytes Absolute 0.4 0.1 - 1.0 K/uL   Eosinophils Relative 0 %   Eosinophils  Absolute 0.0 0.0 - 0.5 K/uL   Basophils Relative 0 %   Basophils Absolute 0.0 0.0 - 0.1 K/uL   Immature Granulocytes 1 %   Abs Immature Granulocytes 0.05 0.00 - 0.07 K/uL    Comment: Performed at [facility], [address], [place], [place] [address]  Protime-INR     Status: None   Collection Time: 09/15/21 11:18 AM  Result Value Ref Range   Prothrombin Time 12.9 11.4 - 15.2 seconds   INR 1.0 0.8 - 1.2    Comment: (NOTE) INR goal varies based on device and disease states. Performed at [facility], [place] [address], [place], [place] [address]   Basic metabolic panel     Status: Abnormal   Collection Time: 09/15/21 11:18 AM  Result Value Ref Range   Sodium 141 135 - 145 mmol/L   Potassium 4.8 3.5 - 5.1 mmol/L    Comment: SLIGHT HEMOLYSIS   Chloride 106 98 - 111 mmol/L   CO2 24 22 - 32 mmol/L   Glucose, Bld 107 (H) 70 - 99 mg/dL    Comment: Glucose reference range applies only to samples taken after fasting for at least 8 hours.  BUN 25 (H) 8 - 23 mg/dL   Creatinine, Ser 1.94 (H) 0.44 - 1.00 mg/dL   Calcium 9.3 8.9 - 10.3 mg/dL   GFR, Estimated 23 (L) >60 mL/min    Comment: (NOTE) Calculated using the CKD-EPI Creatinine Equation (2021)    Anion gap 11 5 - 15    Comment: Performed at [facility] [address] [place], [place] [address]  Type and screen [place]     Status: None   Collection Time: 09/15/21 11:50 AM  Result Value Ref Range   ABO/RH(D) O POS    Antibody Screen NEG    Sample Expiration      09/18/2021,2359 Performed at [facility], [place] [address] [place], [place] [address]   ABO/Rh     Status: None   Collection Time: 09/16/21  4:30 AM  Result Value Ref Range   ABO/RH(D)      O POS Performed at [facility] [address], [place], [place] [address]   Basic metabolic panel     Status: Abnormal   Collection Time: 09/16/21  4:30 AM  Result Value Ref Range   Sodium 141 135 - 145 mmol/L   Potassium  3.9 3.5 - 5.1 mmol/L    Comment: DELTA CHECK NOTED   Chloride 107 98 - 111 mmol/L   CO2 27 22 - 32 mmol/L   Glucose, Bld 123 (H) 70 - 99 mg/dL    Comment: Glucose reference range applies only to samples taken after fasting for at least 8 hours.   BUN 24 (H) 8 - 23 mg/dL   Creatinine, Ser 1.92 (H) 0.44 - 1.00 mg/dL   Calcium 8.7 (L) 8.9 - 10.3 mg/dL   GFR, Estimated 23 (L) >60 mL/min    Comment: (NOTE) Calculated using the CKD-EPI Creatinine Equation (2021)    Anion gap 7 5 - 15    Comment: Performed at [facility] [address], [place], [place] [address]  CBC     Status: Abnormal   Collection Time: 09/16/21  4:30 AM  Result Value Ref Range   WBC 15.1 (H) 4.0 - 10.5 K/uL   RBC 4.06 3.87 - 5.11 MIL/uL   Hemoglobin 11.7 (L) 12.0 - 15.0 g/dL   HCT 36.4 36.0 - 46.0 %   MCV 89.7 80.0 - 100.0 fL   MCH 28.8 26.0 - 34.0 pg   MCHC 32.1 30.0 - 36.0 g/dL   RDW 13.4 11.5 - 15.5 %   Platelets 233 150 - 400 K/uL   nRBC 0.0 0.0 - 0.2 %    Comment: Performed at [facility], [place] [address] [place], [place] [address]    DG Knee Left Port  Result Date: 09/16/2021 CLINICAL DATA:  Closed displaced fracture of the left femoral neck. EXAM: PORTABLE LEFT KNEE - 1-2 VIEW COMPARISON:  Left pelvis and left hip radiographs 09/15/2021 FINDINGS: Mildly decreased bone mineralization. Moderate medial compartment joint space narrowing. Moderate lateral and mild medial compartment chondrocalcinosis. No joint effusion. Minimal chronic enthesopathic change at the quadriceps insertion on the patella. No acute fracture is seen. No dislocation. IMPRESSION: Moderate medial compartment joint space narrowing. No acute fracture. Electronically Signed   By: Yvonne Kendall M.D.   On: 09/16/2021 08:27   DG Pelvis 1-2 Views  Result Date: 09/15/2021 CLINICAL DATA:  Trauma, fall EXAM: PELVIS - 1-2 VIEW COMPARISON:  None Available. FINDINGS: Comminuted fracture is seen in the neck of left femur.  There is superior  displacement of distal major fracture fragment. Degenerative changes are noted in the lower lumbar spine. IMPRESSION: Comminuted displaced fracture is seen in the neck of left femur. Electronically Signed   By: Elmer Picker M.D.   On: 09/15/2021 11:52   DG Femur Min 2 Views Left  Result Date: 09/15/2021 CLINICAL DATA:  Trauma, fall EXAM: LEFT FEMUR 2 VIEWS COMPARISON:  None Available. FINDINGS: Fracture is seen in the subcapital portion neck of left femur. There is superior displacement of distal major fracture fragment. There is varus deformity at the fracture site. There is no dislocation. IMPRESSION: Displaced fracture is seen in the neck of proximal left femur. Electronically Signed   By: Elmer Picker M.D.   On: 09/15/2021 11:51    Review of Systems  Unable to perform ROS: Mental status change   Blood pressure (!) 176/80, pulse 94, temperature 97.8 F (36.6 C), resp. rate 17, height 4\' 9"  (1.448 m), weight 41.8 kg, SpO2 94 %. Physical Exam Constitutional:      General: She is not in acute distress.    Appearance: She is well-developed. She is not diaphoretic.  HENT:     Head: Normocephalic and atraumatic.  Eyes:     General: No scleral icterus.       Right eye: No discharge.        Left eye: No discharge.     Conjunctiva/sclera: Conjunctivae normal.  Cardiovascular:     Rate and Rhythm: Normal rate and regular rhythm.  Pulmonary:     Effort: Pulmonary effort is normal. No respiratory distress.  Musculoskeletal:     Cervical back: Normal range of motion.     Comments: LLE No traumatic wounds, ecchymosis, or rash  Mild TTP hip  No knee or ankle effusion  Knee stable to varus/ valgus and anterior/posterior stress  Sens DPN, SPN, TN intact  Motor EHL, ext, flex, evers 5/5  DP 1+, PT 0, No significant edema  Skin:    General: Skin is warm and dry.  Neurological:     Mental Status: She is alert.  Psychiatric:        Mood and Affect: Mood normal.        Behavior:  Behavior normal.     Assessment/Plan: Left hip fx -- Plan hip hemi today with Dr. Lyla Glassing. Please keep NPO.    Lisette Abu, PA-C Orthopedic Surgery [phone] 09/16/2021, 8:48 AM

## 2021-09-16 NOTE — Hospital Course (Addendum)
Tracy [name] is a 88 y.o. female presenting with femoral neck fracture after mechanical fall from standing. Differential for presentation of this includes osteoporosis versus osteopenia in an elderly patient.    Displaced fracture of left femoral neck [facility])  Fall Patient presenting with mechanical fall from standing leaving home for church today.  Walks with walker at baseline, and forgot walker outside; fell as she turned around to walk without it.  X-ray of left femur in the ED with displaced fracture in the neck of the proximal left femur; redemonstrated on pelvic x-ray with no other acute changes.  Orthopedics consulted in the ED and completed left hemiarthroplasty on HD 2. Pain controlled with Roxicodone 2.5 mg every 6 hours, 2.5 mg every 6 hours as needed breakthrough pain, and Tylenol 650 mg every 6 hours.  Essential hypertension Severely elevated on admission, asymptomatic.  Likely secondary to medication nonadherence (did not take her antihypertensive on day of admission) and pain. Home losartan 25 mg resumed on day of admission, held for surgery, then resumed.   Chronic kidney disease, stage 4 (severe) (HCC) Avoid nephrotoxic agents.  Will obtain VQ scan in light of renal function supports to prevent contrast from CT scan.   Hypoxia, resolved Patient was not hypoxic on admission.  Took 1 dose of fentanyl in ED, and desat to 79% approximately 20 minutes after medication.  Restored sats with 2 LNC.  Suspect due to fentanyl dose, but in light of patient's age and long bone fracture, want to rule out fat embolus as a source of hypoxia.  Issues for follow-up:

## 2021-09-16 NOTE — TOC CAGE-AID Note (Signed)
Transition of Care [facility]) - CAGE-AID Screening   Patient Details  Name: Tracy [name] MRN: [id number] Date of Birth: [birth year]  Transition of Care [facility]) CM/SW Contact:    Edder Bellanca C Tarpley-Carter, [place] Phone Number: 09/16/2021, 3:38 PM   Clinical Narrative: Pt is unable to participate in Cage Aid. Pt is currently in surgery.  Renae Mottley Tarpley-Carter, MSW, LCSW-A Pronouns:  She/Her/Hers [facility]itions of Care Clinical Social Worker Direct Number:  [phone] [email or web address]  CAGE-AID Screening: Substance Abuse Screening unable to be completed due to: : Patient unable to participate

## 2021-09-16 NOTE — Transfer of Care (Signed)
Immediate Anesthesia Transfer of Care Note  Patient: Tracy [name]  Procedure(s) Performed: ANTERIOR APPROACH HEMI HIP ARTHROPLASTY (Left)  Patient Location: PACU  Anesthesia Type:MAC and Spinal  Level of Consciousness: awake, alert  and confused  Airway & Oxygen Therapy: Patient Spontanous Breathing  Post-op Assessment: Report given to RN and Post -op Vital signs reviewed and stable  Post vital signs: Reviewed and stable  Last Vitals:  Vitals Value Taken Time  BP 185/169 09/16/21 1417  Temp    Pulse 92 09/16/21 1419  Resp 24 09/16/21 1419  SpO2 79 % 09/16/21 1419  Vitals shown include unvalidated device data.  Last Pain:  Vitals:   09/16/21 1135  TempSrc: Oral  PainSc:       Patients Stated Pain Goal: 0 (06/00/45 9977)  Complications: No notable events documented.

## 2021-09-16 NOTE — Progress Notes (Signed)
FPTS Brief Progress Note  S: patient's pain is not controlled by oxycodone ir 2.5mg , will increase to 5 mg.   O: BP (!) 156/99 (BP Location: Left Arm)   Pulse 89   Temp 97.8 F (36.6 C)   Resp 18   Ht 4\' 9"  (1.448 m)   Wt 41.8 kg   SpO2 100%   BMI 19.94 kg/m     A/P: Left hip fx - Increase PO pain regimen to oxycodone 5 mg - NPO for left hip hemiarthroplasty tomorrow - Orders reviewed. Labs for AM ordered, which was adjusted as needed.   Gladys Damme, MD 09/16/2021, 2:53 AM PGY-3, [facility] Night Resident  Please page [phone] with questions.

## 2021-09-16 NOTE — Consult Note (Signed)
Reason for Consult:Left hip fx Referring Physician: Todd McDiarmid Time called: 1245 Time at bedside: [name] is an 88 y.o. female.  HPI: Tracy [name] was at church yesterday and forgot her RW. When she turned around to go without it she lost her balance and fell. She had immediate left hip pain and could not get up. She was brought to the ED where x-rays showed a left hip fx and orthopedic surgery was consulted. She is a little confused this morning.  Past Medical History:  Diagnosis Date   Anemia due to stage 4 chronic kidney disease ([place]) 01/30/2021   Chronic kidney disease, stage 4 (severe) ([place]) 01/30/2021   Essential hypertension 01/30/2021   Mixed hyperlipidemia 01/30/2021   Occipital stroke ([place]) 2022    History reviewed. No pertinent surgical history.  Family History  Problem Relation Age of Onset   Cancer Mother    Dementia Sister    Heart disease Neg Hx     Social History:  reports that she has never smoked. She has never used smokeless tobacco. She reports current alcohol use. She reports that she does not use drugs.  Allergies: No Known Allergies  Medications: I have reviewed the patient's current medications.  Results for orders placed or performed during the hospital encounter of 09/15/21 (from the past 48 hour(s))  CBC with Differential     Status: Abnormal   Collection Time: 09/15/21 11:18 AM  Result Value Ref Range   WBC 10.9 (H) 4.0 - 10.5 K/uL   RBC 3.95 3.87 - 5.11 MIL/uL   Hemoglobin 11.6 (L) 12.0 - 15.0 g/dL   HCT 35.3 (L) 36.0 - 46.0 %   MCV 89.4 80.0 - 100.0 fL   MCH 29.4 26.0 - 34.0 pg   MCHC 32.9 30.0 - 36.0 g/dL   RDW 13.4 11.5 - 15.5 %   Platelets 255 150 - 400 K/uL   nRBC 0.0 0.0 - 0.2 %   Neutrophils Relative % 84 %   Neutro Abs 9.1 (H) 1.7 - 7.7 K/uL   Lymphocytes Relative 11 %   Lymphs Abs 1.2 0.7 - 4.0 K/uL   Monocytes Relative 4 %   Monocytes Absolute 0.4 0.1 - 1.0 K/uL   Eosinophils Relative 0 %   Eosinophils  Absolute 0.0 0.0 - 0.5 K/uL   Basophils Relative 0 %   Basophils Absolute 0.0 0.0 - 0.1 K/uL   Immature Granulocytes 1 %   Abs Immature Granulocytes 0.05 0.00 - 0.07 K/uL    Comment: Performed at [facility], [address], [place], [place] [address]  Protime-INR     Status: None   Collection Time: 09/15/21 11:18 AM  Result Value Ref Range   Prothrombin Time 12.9 11.4 - 15.2 seconds   INR 1.0 0.8 - 1.2    Comment: (NOTE) INR goal varies based on device and disease states. Performed at [facility], [place] [address], [place], [place] [address]   Basic metabolic panel     Status: Abnormal   Collection Time: 09/15/21 11:18 AM  Result Value Ref Range   Sodium 141 135 - 145 mmol/L   Potassium 4.8 3.5 - 5.1 mmol/L    Comment: SLIGHT HEMOLYSIS   Chloride 106 98 - 111 mmol/L   CO2 24 22 - 32 mmol/L   Glucose, Bld 107 (H) 70 - 99 mg/dL    Comment: Glucose reference range applies only to samples taken after fasting for at least 8 hours.  BUN 25 (H) 8 - 23 mg/dL   Creatinine, Ser 1.94 (H) 0.44 - 1.00 mg/dL   Calcium 9.3 8.9 - 10.3 mg/dL   GFR, Estimated 23 (L) >60 mL/min    Comment: (NOTE) Calculated using the CKD-EPI Creatinine Equation (2021)    Anion gap 11 5 - 15    Comment: Performed at [facility] [address] [place], [place] [address]  Type and screen [place]     Status: None   Collection Time: 09/15/21 11:50 AM  Result Value Ref Range   ABO/RH(D) O POS    Antibody Screen NEG    Sample Expiration      09/18/2021,2359 Performed at [facility], [place] [address] [place], [place] [address]   ABO/Rh     Status: None   Collection Time: 09/16/21  4:30 AM  Result Value Ref Range   ABO/RH(D)      O POS Performed at [facility] [address] [place], [place] [address]   Basic metabolic panel     Status: Abnormal   Collection Time: 09/16/21  4:30 AM  Result Value Ref Range   Sodium 141 135 - 145 mmol/L   Potassium  3.9 3.5 - 5.1 mmol/L    Comment: DELTA CHECK NOTED   Chloride 107 98 - 111 mmol/L   CO2 27 22 - 32 mmol/L   Glucose, Bld 123 (H) 70 - 99 mg/dL    Comment: Glucose reference range applies only to samples taken after fasting for at least 8 hours.   BUN 24 (H) 8 - 23 mg/dL   Creatinine, Ser 1.92 (H) 0.44 - 1.00 mg/dL   Calcium 8.7 (L) 8.9 - 10.3 mg/dL   GFR, Estimated 23 (L) >60 mL/min    Comment: (NOTE) Calculated using the CKD-EPI Creatinine Equation (2021)    Anion gap 7 5 - 15    Comment: Performed at [facility] [address] [place], [place] [address]  CBC     Status: Abnormal   Collection Time: 09/16/21  4:30 AM  Result Value Ref Range   WBC 15.1 (H) 4.0 - 10.5 K/uL   RBC 4.06 3.87 - 5.11 MIL/uL   Hemoglobin 11.7 (L) 12.0 - 15.0 g/dL   HCT 36.4 36.0 - 46.0 %   MCV 89.7 80.0 - 100.0 fL   MCH 28.8 26.0 - 34.0 pg   MCHC 32.1 30.0 - 36.0 g/dL   RDW 13.4 11.5 - 15.5 %   Platelets 233 150 - 400 K/uL   nRBC 0.0 0.0 - 0.2 %    Comment: Performed at [facility], [place] [address] [place], [place] [address]    DG Knee Left Port  Result Date: 09/16/2021 CLINICAL DATA:  Closed displaced fracture of the left femoral neck. EXAM: PORTABLE LEFT KNEE - 1-2 VIEW COMPARISON:  Left pelvis and left hip radiographs 09/15/2021 FINDINGS: Mildly decreased bone mineralization. Moderate medial compartment joint space narrowing. Moderate lateral and mild medial compartment chondrocalcinosis. No joint effusion. Minimal chronic enthesopathic change at the quadriceps insertion on the patella. No acute fracture is seen. No dislocation. IMPRESSION: Moderate medial compartment joint space narrowing. No acute fracture. Electronically Signed   By: Yvonne Kendall M.D.   On: 09/16/2021 08:27   DG Pelvis 1-2 Views  Result Date: 09/15/2021 CLINICAL DATA:  Trauma, fall EXAM: PELVIS - 1-2 VIEW COMPARISON:  None Available. FINDINGS: Comminuted fracture is seen in the neck of left femur.  There is superior  displacement of distal major fracture fragment. Degenerative changes are noted in the lower lumbar spine. IMPRESSION: Comminuted displaced fracture is seen in the neck of left femur. Electronically Signed   By: Elmer Picker M.D.   On: 09/15/2021 11:52   DG Femur Min 2 Views Left  Result Date: 09/15/2021 CLINICAL DATA:  Trauma, fall EXAM: LEFT FEMUR 2 VIEWS COMPARISON:  None Available. FINDINGS: Fracture is seen in the subcapital portion neck of left femur. There is superior displacement of distal major fracture fragment. There is varus deformity at the fracture site. There is no dislocation. IMPRESSION: Displaced fracture is seen in the neck of proximal left femur. Electronically Signed   By: Elmer Picker M.D.   On: 09/15/2021 11:51    Review of Systems  Unable to perform ROS: Mental status change   Blood pressure (!) 176/80, pulse 94, temperature 97.8 F (36.6 C), resp. rate 17, height 4\' 9"  (1.448 m), weight 41.8 kg, SpO2 94 %. Physical Exam Constitutional:      General: She is not in acute distress.    Appearance: She is well-developed. She is not diaphoretic.  HENT:     Head: Normocephalic and atraumatic.  Eyes:     General: No scleral icterus.       Right eye: No discharge.        Left eye: No discharge.     Conjunctiva/sclera: Conjunctivae normal.  Cardiovascular:     Rate and Rhythm: Normal rate and regular rhythm.  Pulmonary:     Effort: Pulmonary effort is normal. No respiratory distress.  Musculoskeletal:     Cervical back: Normal range of motion.     Comments: LLE No traumatic wounds, ecchymosis, or rash  Mild TTP hip  No knee or ankle effusion  Knee stable to varus/ valgus and anterior/posterior stress  Sens DPN, SPN, TN intact  Motor EHL, ext, flex, evers 5/5  DP 1+, PT 0, No significant edema  Skin:    General: Skin is warm and dry.  Neurological:     Mental Status: She is alert.  Psychiatric:        Mood and Affect: Mood normal.        Behavior:  Behavior normal.     Assessment/Plan: Left hip fx -- Plan hip hemi today with Dr. Lyla Glassing. Please keep NPO.    Lisette Abu, PA-C Orthopedic Surgery [phone] 09/16/2021, 8:48 AM

## 2021-09-16 NOTE — Discharge Instructions (Addendum)
 Dr. Brian Swinteck Joint Replacement Specialist [facility] [address] [place], [place] [address] [phone]   TOTAL HIP REPLACEMENT POSTOPERATIVE DIRECTIONS    Hip Rehabilitation, Guidelines Following Surgery   WEIGHT BEARING Weight bearing as tolerated with assist device (walker, cane, etc) as directed, use it as long as suggested by your surgeon or therapist, typically at least 4-6 weeks.  The results of a hip operation are greatly improved after range of motion and muscle strengthening exercises. Follow all safety measures which are given to protect your hip. If any of these exercises cause increased pain or swelling in your joint, decrease the amount until you are comfortable again. Then slowly increase the exercises. Call your caregiver if you have problems or questions.   HOME CARE INSTRUCTIONS  Most of the following instructions are designed to prevent the dislocation of your new hip.  Remove items at home which could result in a fall. This includes throw rugs or furniture in walking pathways.  Continue medications as instructed at time of discharge. You may have some home medications which will be placed on hold until you complete the course of blood thinner medication. You may start showering once you are discharged home. Do not remove your dressing. Do not put on socks or shoes without following the instructions of your caregivers.   Sit on chairs with arms. Use the chair arms to help push yourself up when arising.  Arrange for the use of a toilet seat elevator so you are not sitting low.  Walk with walker as instructed.  You may resume a sexual relationship in one month or when given the OK by your caregiver.  Use walker as long as suggested by your caregivers.  You may put full weight on your legs and walk as much as is comfortable. Avoid periods of inactivity such as sitting longer than an hour when not asleep. This helps prevent blood  clots.  You may return to work once you are cleared by your surgeon.  Do not drive a car for 6 weeks or until released by your surgeon.  Do not drive while taking narcotics.  Wear elastic stockings for two weeks following surgery during the day but you may remove then at night.  Make sure you keep all of your appointments after your operation with all of your doctors and caregivers. You should call the office at the above phone number and make an appointment for approximately two weeks after the date of your surgery. Please pick up a stool softener and laxative for home use as long as you are requiring pain medications. ICE to the affected hip every three hours for 30 minutes at a time and then as needed for pain and swelling. Continue to use ice on the hip for pain and swelling from surgery. You may notice swelling that will progress down to the foot and ankle.  This is normal after surgery.  Elevate the leg when you are not up walking on it.   It is important for you to complete the blood thinner medication as prescribed by your doctor. Continue to use the breathing machine which will help keep your temperature down.  It is common for your temperature to cycle up and down following surgery, especially at night when you are not up moving around and exerting yourself.  The breathing machine keeps your lungs expanded and your temperature down.  RANGE OF MOTION AND STRENGTHENING EXERCISES  These exercises are designed to help you   keep full movement of your hip joint. Follow your caregiver's or physical therapist's instructions. Perform all exercises about fifteen times, three times per day or as directed. Exercise both hips, even if you have had only one joint replacement. These exercises can be done on a training (exercise) mat, on the floor, on a table or on a bed. Use whatever works the best and is most comfortable for you. Use music or television while you are exercising so that the exercises are a  pleasant break in your day. This will make your life better with the exercises acting as a break in routine you can look forward to.  ?Lying on your back, slowly slide your foot toward your buttocks, raising your knee up off the floor. Then slowly slide your foot back down until your leg is straight again.  ?Lying on your back spread your legs as far apart as you can without causing discomfort.  ?Lying on your side, raise your upper leg and foot straight up from the floor as far as is comfortable. Slowly lower the leg and repeat.  ?Lying on your back, tighten up the muscle in the front of your thigh (quadriceps muscles). You can do this by keeping your leg straight and trying to raise your heel off the floor. This helps strengthen the largest muscle supporting your knee.  ?Lying on your back, tighten up the muscles of your buttocks both with the legs straight and with the knee bent at a comfortable angle while keeping your heel on the floor.  ? ?SKILLED REHAB INSTRUCTIONS: ?If the patient is transferred to a skilled rehab facility following release from the hospital, a list of the current medications will be sent to the facility for the patient to continue.  When discharged from the skilled rehab facility, please have the facility set up the patient's Home Health Physical Therapy prior to being released. Also, the skilled facility will be responsible for providing the patient with their medications at time of release from the facility to include their pain medication and their blood thinner medication. If the patient is still at the rehab facility at time of the two week follow up appointment, the skilled rehab facility will also need to assist the patient in arranging follow up appointment in our office and any transportation needs. ? ?POST-OPERATIVE OPIOID TAPER INSTRUCTIONS: ?It is important to wean off of your opioid medication as soon as possible. If you do not need pain medication after your surgery it is ok  to stop day one. ?Opioids include: ?Codeine, Hydrocodone(Norco, Vicodin), Oxycodone(Percocet, oxycontin) and hydromorphone amongst others.  ?Long term and even short term use of opiods can cause: ?Increased pain response ?Dependence ?Constipation ?Depression ?Respiratory depression ?And more.  ?Withdrawal symptoms can include ?Flu like symptoms ?Nausea, vomiting ?And more ?Techniques to manage these symptoms ?Hydrate well ?Eat regular healthy meals ?Stay active ?Use relaxation techniques(deep breathing, meditating, yoga) ?Do Not substitute Alcohol to help with tapering ?If you have been on opioids for less than two weeks and do not have pain than it is ok to stop all together.  ?Plan to wean off of opioids ?This plan should start within one week post op of your joint replacement. ?Maintain the same interval or time between taking each dose and first decrease the dose.  ?Cut the total daily intake of opioids by one tablet each day ?Next start to increase the time between doses. ?The last dose that should be eliminated is the evening dose.  ? ? ?MAKE   SURE YOU:  Understand these instructions.  Will watch your condition.  Will get help right away if you are not doing well or get worse.  Pick up stool softner and laxative for home use following surgery while on pain medications. Do not remove your dressing. The dressing is waterproof--it is OK to take showers. Continue to use ice for pain and swelling after surgery. Do not use any lotions or creams on the incision until instructed by your surgeon. Total Hip Protocol.  Information on my medicine - ELIQUIS (apixaban)   Why was Eliquis prescribed for you? Eliquis was prescribed to treat blood clots that may have been found in the veins of your legs (deep vein thrombosis) or in your lungs (pulmonary embolism) and to reduce the risk of them occurring again.  What do You need to know about Eliquis ? You will take 5 mg (one 5 mg tablet) TWICE daily.   Eliquis may be taken with or without food.   Try to take the dose about the same time in the morning and in the evening. If you have difficulty swallowing the tablet whole please discuss with your pharmacist how to take the medication safely.  Take Eliquis exactly as prescribed and DO NOT stop taking Eliquis without talking to the doctor who prescribed the medication.  Stopping may increase your risk of developing a new blood clot.  Refill your prescription before you run out.  After discharge, you should have regular check-up appointments with your healthcare provider that is prescribing your Eliquis.    What do you do if you miss a dose? If a dose of ELIQUIS is not taken at the scheduled time, take it as soon as possible on the same day and twice-daily administration should be resumed. The dose should not be doubled to make up for a missed dose.  Important Safety Information A possible side effect of Eliquis is bleeding. You should call your healthcare provider right away if you experience any of the following: Bleeding from an injury or your nose that does not stop. Unusual colored urine (red or dark brown) or unusual colored stools (red or black). Unusual bruising for unknown reasons. A serious fall or if you hit your head (even if there is no bleeding).  Some medicines may interact with Eliquis and might increase your risk of bleeding or clotting while on Eliquis. To help avoid this, consult your healthcare provider or pharmacist prior to using any new prescription or non-prescription medications, including herbals, vitamins, non-steroidal anti-inflammatory drugs (NSAIDs) and supplements.  This website has more information on Eliquis (apixaban): http://www.eliquis.com/eliquis/home

## 2021-09-17 ENCOUNTER — Inpatient Hospital Stay (HOSPITAL_COMMUNITY): Payer: Medicare PPO

## 2021-09-17 ENCOUNTER — Encounter (HOSPITAL_COMMUNITY): Payer: Self-pay | Admitting: Orthopedic Surgery

## 2021-09-17 DIAGNOSIS — H53462 Homonymous bilateral field defects, left side: Secondary | ICD-10-CM

## 2021-09-17 DIAGNOSIS — M8000XA Age-related osteoporosis with current pathological fracture, unspecified site, initial encounter for fracture: Secondary | ICD-10-CM

## 2021-09-17 DIAGNOSIS — R569 Unspecified convulsions: Secondary | ICD-10-CM | POA: Diagnosis not present

## 2021-09-17 DIAGNOSIS — F01B0 Vascular dementia, moderate, without behavioral disturbance, psychotic disturbance, mood disturbance, and anxiety: Secondary | ICD-10-CM | POA: Diagnosis not present

## 2021-09-17 DIAGNOSIS — S72002A Fracture of unspecified part of neck of left femur, initial encounter for closed fracture: Secondary | ICD-10-CM | POA: Diagnosis not present

## 2021-09-17 DIAGNOSIS — M810 Age-related osteoporosis without current pathological fracture: Secondary | ICD-10-CM | POA: Diagnosis present

## 2021-09-17 HISTORY — DX: Age-related osteoporosis without current pathological fracture: M81.0

## 2021-09-17 HISTORY — DX: Homonymous bilateral field defects, left side: H53.462

## 2021-09-17 LAB — BASIC METABOLIC PANEL
Anion gap: 10 (ref 5–15)
BUN: 26 mg/dL — ABNORMAL HIGH (ref 8–23)
CO2: 22 mmol/L (ref 22–32)
Calcium: 8.3 mg/dL — ABNORMAL LOW (ref 8.9–10.3)
Chloride: 104 mmol/L (ref 98–111)
Creatinine, Ser: 2.18 mg/dL — ABNORMAL HIGH (ref 0.44–1.00)
GFR, Estimated: 20 mL/min — ABNORMAL LOW (ref >60)
Glucose, Bld: 123 mg/dL — ABNORMAL HIGH (ref 70–99)
Potassium: 3.7 mmol/L (ref 3.5–5.1)
Sodium: 136 mmol/L (ref 135–145)

## 2021-09-17 LAB — GLUCOSE, CAPILLARY
Glucose-Capillary: 163 mg/dL — ABNORMAL HIGH (ref 70–99)
Glucose-Capillary: 124 mg/dL — ABNORMAL HIGH (ref 70–99)

## 2021-09-17 LAB — CBC
HCT: 31 % — ABNORMAL LOW (ref 36.0–46.0)
Hemoglobin: 10 g/dL — ABNORMAL LOW (ref 12.0–15.0)
MCH: 29.2 pg (ref 26.0–34.0)
MCHC: 32.3 g/dL (ref 30.0–36.0)
MCV: 90.4 fL (ref 80.0–100.0)
Platelets: 213 10*3/uL (ref 150–400)
RBC: 3.43 MIL/uL — ABNORMAL LOW (ref 3.87–5.11)
RDW: 13.3 % (ref 11.5–15.5)
WBC: 13.4 10*3/uL — ABNORMAL HIGH (ref 4.0–10.5)
nRBC: 0 % (ref 0.0–0.2)

## 2021-09-17 LAB — ALT: ALT: 14 U/L (ref 0–44)

## 2021-09-17 LAB — AST: AST: 44 U/L — ABNORMAL HIGH (ref 15–41)

## 2021-09-17 LAB — AMMONIA: Ammonia: 16 umol/L (ref 9–35)

## 2021-09-17 LAB — MAGNESIUM: Magnesium: 2 mg/dL (ref 1.7–2.4)

## 2021-09-17 MED ORDER — BISACODYL 10 MG RE SUPP
10.0000 mg | Freq: Every day | RECTAL | Status: DC | PRN
Start: 2021-09-17 — End: 2021-09-24
  Administered 2021-09-20: 10 mg via RECTAL
  Filled 2021-09-17: qty 1

## 2021-09-17 MED ORDER — LORAZEPAM 2 MG/ML IJ SOLN
1.0000 mg | Freq: Once | INTRAMUSCULAR | Status: AC | PRN
  Administered 2021-09-17: 1 mg via INTRAVENOUS
  Filled 2021-09-17: qty 1

## 2021-09-17 MED ORDER — VALPROATE SODIUM 100 MG/ML IV SOLN
850.0000 mg | Freq: Once | INTRAVENOUS | Status: AC
  Administered 2021-09-17: 850 mg via INTRAVENOUS
  Filled 2021-09-17: qty 8.5

## 2021-09-17 MED ORDER — LORAZEPAM 2 MG/ML IJ SOLN: 1.0000 mg | Freq: Once | INTRAMUSCULAR | Status: DC | PRN

## 2021-09-17 MED ORDER — LORAZEPAM 2 MG/ML IJ SOLN
4.0000 mg | Freq: Once | INTRAMUSCULAR | Status: DC
Start: 2021-09-17 — End: 2021-09-17

## 2021-09-17 MED ORDER — VALPROIC ACID 250 MG PO CAPS
250.0000 mg | ORAL_CAPSULE | Freq: Three times a day (TID) | ORAL | Status: DC
  Filled 2021-09-17 (×2): qty 1

## 2021-09-17 MED ORDER — BISACODYL 10 MG RE SUPP
10.0000 mg | Freq: Once | RECTAL | Status: DC
Start: 2021-09-17 — End: 2021-09-17

## 2021-09-17 MED ORDER — LOSARTAN POTASSIUM 25 MG PO TABS
25.0000 mg | ORAL_TABLET | Freq: Every day | ORAL | Status: DC
  Administered 2021-09-17: 25 mg via ORAL
  Filled 2021-09-17: qty 1

## 2021-09-17 MED ORDER — AMLODIPINE BESYLATE 10 MG PO TABS
10.0000 mg | ORAL_TABLET | Freq: Every day | ORAL | Status: DC
  Administered 2021-09-17 – 2021-09-24 (×8): 10 mg via ORAL
  Filled 2021-09-17 (×8): qty 1

## 2021-09-17 MED ORDER — VALPROATE SODIUM 100 MG/ML IV SOLN
250.0000 mg | Freq: Three times a day (TID) | INTRAVENOUS | Status: DC
  Administered 2021-09-17 – 2021-09-18 (×3): 250 mg via INTRAVENOUS
  Filled 2021-09-17 (×5): qty 2.5

## 2021-09-17 MED ORDER — LORAZEPAM 2 MG/ML IJ SOLN
0.5000 mg | Freq: Once | INTRAMUSCULAR | Status: AC
Start: 2021-09-17 — End: 2021-09-17
  Administered 2021-09-17: 0.5 mg via INTRAVENOUS
  Filled 2021-09-17: qty 1

## 2021-09-17 NOTE — Procedures (Signed)
Patient Name: Tracy [name]  MRN: [id number]  Epilepsy Attending: Lora Havens  Referring Physician/Provider: Kerney Elbe, MD  Date: 09/17/2021 Duration: 23.31 mins  Patient history:  88 y.o. female with a PMHx of left PCA stroke in 2022, CKD4, HTN, gout and HLD who is s/p left hemiarthroplasty. Early this AM she was noted to be acutely confused, following which she had witnessed seizure activity lasting for about 3 minutes. EEG to evaluate for seizure.   Level of alertness: lethargic   AEDs during EEG study: VPA  Technical aspects: This EEG study was done with scalp electrodes positioned according to the 10-20 International system of electrode placement. Electrical activity was acquired at a sampling rate of 500Hz  and reviewed with a high frequency filter of 70Hz  and a low frequency filter of 1Hz . EEG data were recorded continuously and digitally stored.   Description: EEG showed continuous generalized and maximal right frontotemporal region 3 to 6 Hz theta-delta slowing. Hyperventilation and photic stimulation were not performed.     ABNORMALITY - Continuous slow, generalized and maximal right frontotemporal region  IMPRESSION: This study is is suggestive of cortical dysfunction arising from right frontotemporal region, nonspecific etiology but likely secondary to underlying structural abnormality, post-ictal state. Additionally there is  Moderate diffuse encephalopathy, nonspecific etiology. No seizures or epileptiform discharges were seen throughout the recording.  If suspicion for ictal- interictal activity remains a concern, a prolonged study can be considered.   Jonny Dearden Barbra Sarks

## 2021-09-17 NOTE — Progress Notes (Signed)
Daily Progress Note Intern Pager: [phone]  Patient name: Tracy [name] Medical record number: [id number] Date of birth: 30-Jan-1920 Age: 88 y.o. Gender: female  Primary Care Provider: Charlane Ferretti, MD Consultants: Orthopedic surgery, neurology Code Status: DNR  Pt Overview and Major Events to Date:  6/11: Admitted 6/12: To OR for left hemiarthroplasty 6/12: Witnessed seizure like activity; neurology consulted and EEG completed   Assessment and Plan:  Tracy [name] is a 89 year old female who presented with femoral neck fracture after fall from standing without loss of consciousness now POD 1 left hemiarthroplasty. Pertinent PMH/PSH includes multiple strokes complicated by vascular dementia, CKD stage IV, HTN, and HLD.   * Displaced fracture of left femoral neck ([place]) Patient presented with mechanical fall from standing as she turned around to walk without her walker.  X-ray of left femur in the ED with displaced fracture in the neck of the proximal left femur. Orthopedics consulted in the ED who advised that patient will require hemiarthroplasty; appreciate assistance in patient's care. - Plan for left hemiarthroplasty today, per orthopedics -We will follow-up Ortho recs postoperatively - Pain control with Roxicodone 5 mg every 6 hours, 2.5 mg every 6 hours as needed breakthrough pain, and Tylenol 650 mg every 6 hours -Fall precautions -Delirium precautions  Primary hypertension Severely elevated, asymptomatic.  Likely secondary to medication nonadherence (did not take her antihypertensive yesterday AM PTA) and pain. - Hold home losartan 25 mg for planned surgery today  Chronic renal failure syndrome Avoid nephrotoxic agents.  Will obtain VQ scan in light of renal function supports to prevent contrast from CT scan.  Witnessed seizure-like activity ([place]) Overnight, patient with witnessed 3-minute seizure-like activity episode, and 1 to 2-hour postictal state.  Neurology  consulted and made the following recommendations: - Depakote loading dose 850 mg IV, followed by scheduled 250 mg p.o. 3 times daily - Follow-up VPA level Wednesday a.m. -EEG to be obtained this a.m. - Magnesium, AST, ALT, and ammonia all largely WNL -Inpatient seizure precautions -Appreciate neurology's assistance in this patient's care  Cognitive impairment On assessment 6/12 AM, patient oriented to self (name but not age-stated 89), place, and year (initially stated 1993, but caught herself and stated 25 without prompting).  On admission, patient was oriented to situation.  Per family, this appears to be her baseline.  Fall Presented with a mechanical fall from standing after tripping while turning around from walker.  Hypoxia-resolved as of 09/17/2021 Patient was not hypoxic on admission.  Took 1 dose of fentanyl in ED, and desat to 79% approximately 20 minutes after medication.  Restored sats with 2 LNC.  Suspect due to fentanyl dose, but in light of patient's age and long bone fracture, considered VQ scan. - VQ scan not obtained, as symptoms resolved by the time schedule permitted scan to be completed.   Chronic conditions: Gout-allopurinol 200 mg daily History of CVA HLD-aspirin 81 mg and Lipitor 20 mg Dementia-memantine 10 mg GERD-Protonix 40 mg    FEN/GI: Regular PPx: SQ heparin Dispo:Pending PT recommendations  pending clinical improvement . Barriers include postop recommendations and PT/OT evaluation.   Subjective:  Overnight, patient had witnessed seizure-like activity lasting 3 minutes with a postictal state of approximately 1 to 2 hours.  This AM, patient had another seizure-like episode. Prior to the episode, she was confused, not following commands and with increased latency in answering one question (her first name), and with facial twitching. Then she had an episode where she had a L  upward gaze, clonus of bilateral upper extremities, and mouth smacking. She did  not bite her tongue nor was she incontinent of urine. This episode lasted about a minute and a half, and right after she began snoring. She was given 1mg  Ativan, and her breathing calmed. After the episode, she was not withdrawing to painful stimuli and had positive Babinski signs bilaterally.   Niece, [name], called with update on patient's status.  All questions answered.  Objective: Temp:  [97.8 F (36.6 C)-98.8 F (37.1 C)] 98.5 F (36.9 C) (06/13 1310) Pulse Rate:  [89-102] 94 (06/13 1310) Resp:  [15-23] 16 (06/13 1310) BP: (125-196)/(61-124) 136/62 (06/13 1310) SpO2:  [90 %-100 %] 97 % (06/13 1310) Physical Exam: General: Initially alert, but appears to look through this Probation officer.  Not tracking with eyes, not responding to commands prior to witnessed seizure-like episode as mentioned above.  After episode, patient with sonorous breathing, which calmed after receiving Ativan. Cardiovascular: Tachycardic rate, normal rhythm Respiratory: CTA x2.  Normal WOB on room air. Abdomen: Prior to episode, abdominal distention, but soft and nondistended after episode. Extremities: Nonedematous BLE Neuro: When this writer entered the room, patient advised that she was "fine."  She had increased latency of response when asked her name, but did offer her first name.  Shortly after, patient with facial twitches and nonresponsive, then began seizure-like episode.  Laboratory: Most recent CBC Lab Results  Component Value Date   WBC 13.4 (H) 09/17/2021   HGB 10.0 (L) 09/17/2021   HCT 31.0 (L) 09/17/2021   MCV 90.4 09/17/2021   PLT 213 09/17/2021   Most recent BMP    Latest Ref Rng & Units 09/17/2021    1:08 AM  BMP  Glucose 70 - 99 mg/dL 123   BUN 8 - 23 mg/dL 26   Creatinine 0.44 - 1.00 mg/dL 2.18   Sodium 135 - 145 mmol/L 136   Potassium 3.5 - 5.1 mmol/L 3.7   Chloride 98 - 111 mmol/L 104   CO2 22 - 32 mmol/L 22   Calcium 8.9 - 10.3 mg/dL 8.3     Other pertinent labs magnesium: 2.0,  ammonia 16, AST 44, ALT 14  Imaging/Diagnostic Tests: Postop imaging reinforced expected changes after total left hip arthroplasty  Rosezetta Schlatter, MD 09/17/2021, 2:03 PM  PGY-1, [place] Intern pager: [phone], text pages welcome Secure chat group [place]

## 2021-09-17 NOTE — Assessment & Plan Note (Deleted)
Stable Patient oriented to self, location, unable to tell the correct year.  Patient unaware of the reason that she has been hospitalized.

## 2021-09-17 NOTE — TOC CAGE-AID Note (Signed)
Transition of Care [facility]) - CAGE-AID Screening   Patient Details  Name: Tracy [name] MRN: [id number] Date of Birth: Sep 04, 1919  [facility]) CM/SW Contact:    Itsel Opfer C Tarpley-Carter, [place] Phone Number: 09/17/2021, 11:40 AM   Clinical Narrative: Pt is unable to participate in Cage Aid. Pt is a poor historian due to underlying vascular dementia.  Cortana Vanderford Tarpley-Carter, MSW, LCSW-A Pronouns:  She/Her/Hers [facility]itions of Care Clinical Social Worker Direct Number:  [phone] [email or web address]  CAGE-AID Screening: Substance Abuse Screening unable to be completed due to: : Patient unable to participate

## 2021-09-17 NOTE — Assessment & Plan Note (Addendum)
Overnight 6/12, patient with witnessed 3-minute seizure-like activity episode, and 1 to 2-hour postictal state.  Neurology consulted and made the following recommendations: Depakote 250 mg p.o. 3 times daily (VPA 43); magnesium, AST, ALT, and ammonia all largely WNL; inpatient seizure precautions.  Appreciate neurology's assistance in this patient's care. 6/13, patient with a second witnessed seizure-like episode lasting 1.5 minutes.  See progress note from this writer on 6/13 for detailed event.  Afterwards, EEG obtained which showed the following: Suggestive of cortical dysfunction from the R frontotemporal region and post-ictal state. No seizures or epileptiform discharges seen. No further episodes since 6/13.  -- Given patient's age and renal function in the need for longer term anti-epileptic therapy, continue Keppra 250 mg daily.

## 2021-09-17 NOTE — Progress Notes (Signed)
    Subjective:  Per chart patient had a witnessed seizure this morning lasting about 3 minutes per chart notes ~0432.   Patient is a poor historian due to underlying vascular dementia and seizure event this morning. Unsure of baseline.  Patient is alert this morning but unable to fully answer questions. She can grossly follow commands.  Patient does not currently report any pain.  Denies N/V/CP/SOB/Abd pain.    Objective:   VITALS:   Vitals:   09/17/21 0428 09/17/21 0429 09/17/21 0434 09/17/21 0528  BP: (!) 196/75 (!) 161/80 (!) 180/90 (!) 191/93  Pulse: 97 (!) 101 (!) 102 (!) 102  Resp: 20   18  Temp:    98.2 F (36.8 C)  TempSrc:    Oral  SpO2: 100% 100% 100% 95%  Weight:      Height:        Patient is lying comfortably in bed. NAD. Patient still appears to be confused. She seems to be attempting/searching for words. She is unable to fully answer questions but attempted DOB.  ABD soft Neurovascular intact Sensation intact distally Intact pulses distally Dorsiflexion/Plantar flexion intact Incision: dressing C/D/I No cellulitis present Compartment soft No reproducible pain throughout exam and painless log rolling of the hip.   Lab Results  Component Value Date   WBC 13.4 (H) 09/17/2021   HGB 10.0 (L) 09/17/2021   HCT 31.0 (L) 09/17/2021   MCV 90.4 09/17/2021   PLT 213 09/17/2021   BMET    Component Value Date/Time   NA 136 09/17/2021 0108   K 3.7 09/17/2021 0108   CL 104 09/17/2021 0108   CO2 22 09/17/2021 0108   GLUCOSE 123 (H) 09/17/2021 0108   BUN 26 (H) 09/17/2021 0108   CREATININE 2.18 (H) 09/17/2021 0108   CALCIUM 8.3 (L) 09/17/2021 0108   GFRNONAA 20 (L) 09/17/2021 0108     Assessment/Plan: 1 Day Post-Op   Principal Problem:   Displaced fracture of left femoral neck (HCC) Active Problems:   Primary hypertension   Anemia due to stage 4 chronic kidney disease (HCC)   Chronic renal failure syndrome   Hypoxia   Fall   Cognitive  impairment  S/p left hip hemiarthroplasty  WBAT with walker as able. DVT ppx: Aspirin, SCDs, TEDS PO pain control PT/OT: Ambulate and work with PT as able. Dispo: Patient under hospitalist care and dispo per their recommendation. Aspirin 81mg  BID for DVT ppx, patient currently on low dose oxycodone 2.5 for pain control.    Charlott Rakes, PA-C 09/17/2021, 7:02 AM  [facility]  [place] [address], [place], [place] [address] Phone: [phone] [email or web address] Facebook  [employer]

## 2021-09-17 NOTE — Progress Notes (Addendum)
Pt Had a seizure that lasted about 40min jerking motions and left sided gazed witnessed by MD in the room. RN gave 1mg  of ativan. Pt unresponsive to arousal and sternal rub she is maintaining stats of 100 on RA

## 2021-09-17 NOTE — Progress Notes (Signed)
PT Cancellation Note  Patient Details Name: Tracy [name] MRN: [id number] DOB: 1919-09-02   Cancelled Treatment:    Reason Eval/Treat Not Completed: Medical issues which prohibited therapy (discussed with RN; pt with recurrent seizures)  Wyona Almas, PT, DPT Acute Rehabilitation Services Office [place] 09/17/2021, 9:54 AM

## 2021-09-17 NOTE — Progress Notes (Signed)
FPTS Brief Progress Note  S: Went to bedside to see patient. Patient awake and appeared disheveled, with gown hanging down and chest exposed, and confused. Patient had no complaints at this time, but was laying in bed with her gown down.   O: BP (!) 178/99 (BP Location: Right Arm)   Pulse 98   Temp 98.5 F (36.9 C) (Oral)   Resp 18   Ht 4\' 9"  (1.448 m)   Wt 41.8 kg   SpO2 100%   BMI 19.94 kg/m   General: Active, awake, talkative, disheveled, African American woman  Card: RRR, NRMG Pulm: CTABL Abd: soft, NTTP, non-distended  A/P: Sundowning / delirium Patient appears to be mildly, acutely, delirious, but easily redirectable. She has scattered thought and her sentences are running over one another. Patient not combative and is compliant. Will encourage nursing staff to discourage patient from sleeping during the day, and continue to redirect patient. Nursing reported she's been like this the last couple of nights.  - Plans per day team - Orders reviewed. Labs for AM ordered, which was adjusted as needed.  - Continue delirium precautions   Holley Bouche, MD 09/17/2021, 4:12 AM PGY-1, [place] [facility] Night Resident  Please page [phone] with questions.

## 2021-09-17 NOTE — Significant Event (Addendum)
Rapid Response Event Note   Reason for Call :  Unresponsive, after seizure like activity lasting approximately 3 mins per RN.  Initial Focused Assessment:  On arrival, pt in bed not responding to noxious stimuli. Temp 98.3, HR  100, BP 161/80, RR 18, spO2 99% on NRB mask. Pt taken off O2 and spO2 remained 99-100%  Appears to be postictal. Primary team to bedside.   Interventions:  Ativan ordered PRN Neurology consult Depakote loading dose and scheduled q8h ordered EEG ordered  Plan of Care:  Continue to monitor pt status. Give PRN Ativan for seizure activity. Neurology to come assess pt.   Hold off on giving PO meds until pt alert and back to baseline and clears bedside swallow.  RN instructed to call with any changes or concerns.    Event Summary:   MD Notified: [place] Call Time: [place] Time: [place] Time: 8850  Sherilyn Dacosta, RN

## 2021-09-17 NOTE — Progress Notes (Signed)
OT Cancellation Note  Patient Details Name: Tracy [name] MRN: [id number] DOB: Nov 23, 1919   Cancelled Treatment:    Reason Eval/Treat Not Completed: Medical issues which prohibited therapy Pt with seizure early this AM. Pt discussed with RN who advised to hold therapy attempts for today. Will follow up tomorrow for OT eval.  [name] 09/17/2021, 10:53 AM

## 2021-09-17 NOTE — Progress Notes (Signed)
FPTS Brief Progress Note  S:Received secure chat from nurse asking me to call. Patient had 3 min witnessed seizure. Went to bedside to find Dr. Chauncey Reading and nurse in room. Patient quiet and and looking around room, turning head to voice. Called neurology, Dr. Cheral Marker on call, who recommended starting her on Depakote, and that he would come to see her.   O: BP (!) 180/90   Pulse (!) 102   Temp 98.3 F (36.8 C) (Oral)   Resp 20   Ht 4\' 9"  (1.448 m)   Wt 41.8 kg   SpO2 100%   BMI 19.94 kg/m   General: calm, qiuet, alert, disoriented to sounds, frail, elderly woman Resp: CTABL Card: RRR, NRMG Neuro: Unable to answer questions at this time, but patient attempting to produce words. patient following commands, but appears to be confused. Short attention span, patient easily distracted by sounds  A/P: Seizure Patient with witnessed 3 min seizure, and was post ictal on arrival of this MD to room. Patient appears to be recovering from postictal period, with speech, responsiveness, and attention improving. Etiology of seizure yet unknown as patient has been afebrile with no signs/symptoms of infection. Patient has no history of seizures.  -Depakote 850 mg loading dose -Depakote 250 mg q8h, first dose at 1400 -Neurology following, appreciate Shara Blazing, MD 09/17/2021, 4:52 AM PGY-1, [place] [facility] Night Resident  Please page [phone] with questions.

## 2021-09-17 NOTE — Progress Notes (Signed)
EEG complete - results pending 

## 2021-09-17 NOTE — Anesthesia Postprocedure Evaluation (Signed)
Anesthesia Post Note  Patient: Tracy [name]  Procedure(s) Performed: ANTERIOR APPROACH HEMI HIP ARTHROPLASTY (Left)     Patient location during evaluation: PACU Anesthesia Type: MAC and Spinal Level of consciousness: patient cooperative and awake Pain management: pain level controlled Vital Signs Assessment: post-procedure vital signs reviewed and stable Respiratory status: spontaneous breathing, nonlabored ventilation, respiratory function stable and patient connected to nasal cannula oxygen Cardiovascular status: stable and blood pressure returned to baseline Postop Assessment: no apparent nausea or vomiting Anesthetic complications: no   No notable events documented.  Last Vitals:  Vitals:   09/17/21 0434 09/17/21 0528  BP: (!) 180/90 (!) 191/93  Pulse: (!) 102 (!) 102  Resp:  18  Temp:  36.8 C  SpO2: 100% 95%    Last Pain:  Vitals:   09/17/21 0528  TempSrc: Oral  PainSc:                  [name]

## 2021-09-17 NOTE — Progress Notes (Signed)
At 0416, NT was getting patient vitals when she started having seizure like activity which lasted approximately 3 min then patient became unresponsive. MD notified and RRT called both of them at bedside. New order given (see MAR). Patient now responsive and vitals stable. We continue to monitor.

## 2021-09-17 NOTE — Consult Note (Addendum)
NEURO HOSPITALIST CONSULT NOTE   Requesting physician: Dr. Marcina Millard  Reason for Consult: Seizure  History obtained from:   Chart     HPI:                                                                                                                                          Tracy [name] is an 88 y.o. female with a PMHx of left PCA stroke in 2022, CKD4, HTN, gout and HLD who is s/p left hemiarthroplasty on Monday afternoon for a displaced left femoral neck fracture that was sustained in a mechanical fall from the standing position on Sunday. She did not strike her head in the fall. Early this AM she was noted to be acutely confused, following which she had witnessed seizure activity lasting for about 3 minutes. She was initially densely postictal being unresponsive to noxious stimuli, which improved within several minutes to an awake state with responsiveness to voice, but still with confusion. Neurology was consulted for evaluation and management of first-time seizure.   Past Medical History:  Diagnosis Date   Acute stroke due to occlusion of left posterior cerebral artery ([place]) 01/30/2021   Anemia due to stage 4 chronic kidney disease ([place]) 01/30/2021   Cerebral infarction due to carotid artery occlusion ([place]) 09/16/2021   Chronic kidney disease, stage 4 (severe) ([place]) 01/30/2021   Essential hypertension 01/30/2021   History of gout 09/16/2021   Mixed hyperlipidemia 01/30/2021   Occipital stroke ([place]) 2022    History reviewed. No pertinent surgical history.  Family History  Problem Relation Age of Onset   Cancer Mother    Dementia Sister    Heart disease Neg Hx              Social History:  reports that she has never smoked. She has never used smokeless tobacco. She reports current alcohol use. She reports that she does not use drugs.  No Known Allergies  MEDICATIONS:                                                                                                                      Prior to Admission:  Medications Prior to Admission  Medication Sig Dispense Refill Last Dose   allopurinol (ZYLOPRIM) 100 MG tablet Take  200 mg by mouth in the morning.   09/14/2021 at am   aspirin 81 MG EC tablet Take 81 mg by mouth in the morning.   09/14/2021 at 0900   atorvastatin (LIPITOR) 20 MG tablet Take 20 mg by mouth daily.   09/14/2021   Fiber Select Gummies CHEW Chew 1-2 tablets by mouth daily as needed (for mild constipation symptoms).   Past Month   losartan (COZAAR) 50 MG tablet Take 50 mg by mouth in the morning.   09/14/2021 at am   memantine (NAMENDA) 10 MG tablet TAKE 1 TABLET BY MOUTH TWICE A DAY (Patient taking differently: Take 10 mg by mouth in the morning.) 180 tablet 2 09/14/2021 at am   pantoprazole (PROTONIX) 40 MG tablet Take 40 mg by mouth daily before breakfast.   09/14/2021 at am   furosemide (LASIX) 40 MG tablet Take 1 tablet (40 mg total) by mouth daily. (Patient not taking: Reported on 09/16/2021) 30 tablet  Not Taking   Scheduled:  acetaminophen  650 mg Oral Q6H   allopurinol  200 mg Oral Daily   aspirin EC  81 mg Oral BID WC   atorvastatin  20 mg Oral Daily   docusate sodium  100 mg Oral BID   losartan  25 mg Oral Daily   memantine  10 mg Oral Daily   mupirocin ointment  1 application  Nasal BID   oxyCODONE  2.5 mg Oral Q6H   pantoprazole  40 mg Oral Daily   senna  1 tablet Oral BID   valproic acid  250 mg Oral Q8H   Continuous:  sodium chloride 50 mL/hr at 09/16/21 1624   valproate sodium      ROS:                                                                                                                                       Unable to obtain due to cognitive/communication deficit.    Blood pressure (!) 180/90, pulse (!) 102, temperature 98.3 F (36.8 C), temperature source Oral, resp. rate 20, height 4\' 9"  (1.448 m), weight 41.8 kg, SpO2 100 %.   General Examination:                                                                                                        Physical Exam  HEENT-  [place]/AT   Lungs- Respirations unlabored Extremities- Warm and well perfused   Neurological Examination Mental Status: Awake and alert. Minimal  speech output consists of stereotyped/repeated words and short phrases. Does not speak in full sentences except for "I hope you have a nice day" when examiner leaves the room. Glances erratically about the room suggestive of possible visual hallucinations. Does not answer any orientation questions. Not following commands. Will make eye contact.  Cranial Nerves: II: Blinks to threat on the right but not in left hemifield. PERRL.   III,IV, VI: No ptosis. Eyes are conjugate, with erratic saccades in different directions occurring frequently in addition to head turning towards the directions of the saccades.  V: Reacts to touch bilaterally VII: Smiles spontaneously and symmetrically. VIII: Hearing intact to vocal stimuli. IX,X: No hypophonia or hoarseness XI: Head is midline XII: Does not protrude tongue to command Motor/Sensory: Will spontaneously move BUE equally but does not follow motor commands. Intermittent myoclonic-like movements of upper extremities asynchronously during movement and with stimulation.  Will move right and left lower extremity slightly to noxious plantar stimulation without gross asymmetry. Intermittent jerking movements similar to BUE described above.  Deep Tendon Reflexes: 2+ bilateral upper extremities. Deferred BLE due to recent surgery.  Plantars: Right tonically upgoing. Left equivocal.  Cerebellar: Unable to assess Gait: Deferred due to recent hip surgery   Lab Results: Basic Metabolic Panel: Recent Labs  Lab 09/15/21 1118 09/16/21 0430 09/17/21 0108  NA 141 141 136  K 4.8 3.9 3.7  CL 106 107 104  CO2 24 27 22   GLUCOSE 107* 123* 123*  BUN 25* 24* 26*  CREATININE 1.94* 1.92* 2.18*  CALCIUM 9.3 8.7* 8.3*    CBC: Recent Labs  Lab  09/15/21 1118 09/16/21 0430 09/17/21 0108  WBC 10.9* 15.1* 13.4*  NEUTROABS 9.1*  --   --   HGB 11.6* 11.7* 10.0*  HCT 35.3* 36.4 31.0*  MCV 89.4 89.7 90.4  PLT 255 233 213    Cardiac Enzymes: No results for input(s): "CKTOTAL", "CKMB", "CKMBINDEX", "TROPONINI" in the last 168 hours.  Lipid Panel: No results for input(s): "CHOL", "TRIG", "HDL", "CHOLHDL", "VLDL", "LDLCALC" in the last 168 hours.  Imaging: Pelvis Portable  Result Date: 09/16/2021 CLINICAL DATA:  Postop EXAM: PORTABLE PELVIS 1 VIEWS COMPARISON:  Hip radiograph dated September 15, 2021 FINDINGS: Interval postsurgical changes from left total hip arthroplasty. Arthroplasty components appear in their expected alignment. No periprosthetic fracture is identified. Expected postoperative changes within the overlying soft tissues. IMPRESSION: Expected postsurgical changes of left total hip arthroplasty. Electronically Signed   By: Yetta Glassman M.D.   On: 09/16/2021 15:11   DG HIP UNILAT WITH PELVIS 1V LEFT  Result Date: 09/16/2021 CLINICAL DATA:  Intraoperative fluoroscopy for anterior approach left hip arthroplasty. EXAM: DG HIP (WITH OR WITHOUT PELVIS) 1V*L* COMPARISON:  Pelvis and left femur radiographs 09/15/2021 FINDINGS: Images were performed intraoperatively without the presence of a radiologist. The patient is undergoing total left hip arthroplasty. Total fluoroscopy images: 3 Total fluoroscopy time: 4 seconds Total dose: Radiation Exposure Index (as provided by the fluoroscopic device): 0.134 mGy air Kerma Please see intraoperative findings for further detail. IMPRESSION: Intraoperative fluoroscopy for total left hip arthroplasty. Electronically Signed   By: Yvonne Kendall M.D.   On: 09/16/2021 13:48   DG C-Arm 1-60 Min-No Report  Result Date: 09/16/2021 Fluoroscopy was utilized by the requesting physician.  No radiographic interpretation.   DG CHEST PORT 1 VIEW  Result Date: 09/16/2021 CLINICAL DATA:  Preoperative  evaluation EXAM: PORTABLE CHEST 1 VIEW COMPARISON:  None Available. FINDINGS: Mild interstitial changes are present. Small patchy density at the  right lung base. No pleural effusion. Cardiomediastinal contours are probably within normal limits for technique. IMPRESSION: Small patchy density at the right lung base reflecting atelectasis, scarring, or consolidation. Mild interstitial changes are probably chronic. Electronically Signed   By: Macy Mis M.D.   On: 09/16/2021 12:41   DG Knee Left Port  Result Date: 09/16/2021 CLINICAL DATA:  Closed displaced fracture of the left femoral neck. EXAM: PORTABLE LEFT KNEE - 1-2 VIEW COMPARISON:  Left pelvis and left hip radiographs 09/15/2021 FINDINGS: Mildly decreased bone mineralization. Moderate medial compartment joint space narrowing. Moderate lateral and mild medial compartment chondrocalcinosis. No joint effusion. Minimal chronic enthesopathic change at the quadriceps insertion on the patella. No acute fracture is seen. No dislocation. IMPRESSION: Moderate medial compartment joint space narrowing. No acute fracture. Electronically Signed   By: Yvonne Kendall M.D.   On: 09/16/2021 08:27   DG Pelvis 1-2 Views  Result Date: 09/15/2021 CLINICAL DATA:  Trauma, fall EXAM: PELVIS - 1-2 VIEW COMPARISON:  None Available. FINDINGS: Comminuted fracture is seen in the neck of left femur. There is superior displacement of distal major fracture fragment. Degenerative changes are noted in the lower lumbar spine. IMPRESSION: Comminuted displaced fracture is seen in the neck of left femur. Electronically Signed   By: Elmer Picker M.D.   On: 09/15/2021 11:52   DG Femur Min 2 Views Left  Result Date: 09/15/2021 CLINICAL DATA:  Trauma, fall EXAM: LEFT FEMUR 2 VIEWS COMPARISON:  None Available. FINDINGS: Fracture is seen in the subcapital portion neck of left femur. There is superior displacement of distal major fracture fragment. There is varus deformity at the  fracture site. There is no dislocation. IMPRESSION: Displaced fracture is seen in the neck of proximal left femur. Electronically Signed   By: Elmer Picker M.D.   On: 09/15/2021 11:51     Assessment: 88 y.o. female with a PMHx of left PCA stroke in 2022, CKD4, HTN, gout and HLD who is s/p left hemiarthroplasty. Early this AM she was noted to be acutely confused, following which she had witnessed seizure activity lasting for about 3 minutes. She was initially densely postictal being unresponsive to noxious stimuli, which improved within several minutes to an awake state with responsiveness to voice, but still with confusion. Neurology was consulted for evaluation and management of first-time seizure.  1. Exam reveals severe cognitive/communication deficit in an awake and alert patient, in conjunction with iIntermittent myoclonic-like movements of upper and lower extremities asynchronously during movement and with stimulation, but not at rest. Also noted is left homonymous hemianopsia from prior stroke.  2. Given prior stroke, which could serve as a seizure focus, treatment of her first time seizure with an anticonvulsant is indicated over the long term. 3. Has poor renal function, so Keppra would not be a first choice. Valproic acid is an excellent alternative. LFTs last year were normal.  4. DDx for her erratic limb movements includes seizure activity, agitation and toxic/metabolic encephalopathy with asterixis.    Recommendations: 1. Valproic acid loading dose of 850 mg IV (ordered) 2. Scheduled-dose valproic acid at 250 mg PO TID (ordered) 3. EEG in the AM (ordered) 4. Magnesium, AST, ALT and ammonia levels (ordered) 5. Valproic acid level to be drawn at 0800 on Wednesday.  6. Inpatient seizure precautions.  7. Would educate the patient and her family regarding outpatient seizure precautions: Per [facility] statutes, patients with seizures are not allowed to drive until  they have  been seizure-free for six  months. Use caution when using heavy equipment or power tools. Avoid working on ladders or at heights. Take showers instead of baths. Ensure the water temperature is not too high on the home water heater. Do not go swimming alone. When caring for infants or small children, sit down when holding, feeding, or changing them to minimize risk of injury to the child in the event you have a seizure. Also, maintain good sleep hygiene. Avoid alcohol.   Electronically signed: Dr. Kerney Elbe 09/17/2021, 4:49 AM

## 2021-09-18 ENCOUNTER — Inpatient Hospital Stay (HOSPITAL_COMMUNITY): Payer: Medicare PPO

## 2021-09-18 DIAGNOSIS — F01B0 Vascular dementia, moderate, without behavioral disturbance, psychotic disturbance, mood disturbance, and anxiety: Secondary | ICD-10-CM | POA: Diagnosis not present

## 2021-09-18 DIAGNOSIS — I6389 Other cerebral infarction: Secondary | ICD-10-CM

## 2021-09-18 DIAGNOSIS — M8000XA Age-related osteoporosis with current pathological fracture, unspecified site, initial encounter for fracture: Secondary | ICD-10-CM | POA: Diagnosis not present

## 2021-09-18 DIAGNOSIS — R569 Unspecified convulsions: Secondary | ICD-10-CM | POA: Diagnosis not present

## 2021-09-18 DIAGNOSIS — I639 Cerebral infarction, unspecified: Secondary | ICD-10-CM | POA: Diagnosis not present

## 2021-09-18 DIAGNOSIS — S72002A Fracture of unspecified part of neck of left femur, initial encounter for closed fracture: Secondary | ICD-10-CM

## 2021-09-18 LAB — BASIC METABOLIC PANEL
Anion gap: 9 (ref 5–15)
BUN: 28 mg/dL — ABNORMAL HIGH (ref 8–23)
CO2: 23 mmol/L (ref 22–32)
Calcium: 8.1 mg/dL — ABNORMAL LOW (ref 8.9–10.3)
Chloride: 104 mmol/L (ref 98–111)
Creatinine, Ser: 2.29 mg/dL — ABNORMAL HIGH (ref 0.44–1.00)
GFR, Estimated: 19 mL/min — ABNORMAL LOW (ref >60)
Glucose, Bld: 95 mg/dL (ref 70–99)
Potassium: 3.9 mmol/L (ref 3.5–5.1)
Sodium: 136 mmol/L (ref 135–145)

## 2021-09-18 LAB — ECHOCARDIOGRAM COMPLETE
AR max vel: 1.43 cm2
AV Area VTI: 1.4 cm2
AV Area mean vel: 1.35 cm2
AV Mean grad: 4 mmHg
AV Peak grad: 6.4 mmHg
Ao pk vel: 1.26 m/s
Area-P 1/2: 3.24 cm2
Height: 57 in
P 1/2 time: 387 msec
S' Lateral: 2.7 cm
Weight: 1474.44 oz

## 2021-09-18 LAB — CBC
HCT: 25.2 % — ABNORMAL LOW (ref 36.0–46.0)
Hemoglobin: 8.3 g/dL — ABNORMAL LOW (ref 12.0–15.0)
MCH: 29.3 pg (ref 26.0–34.0)
MCHC: 32.9 g/dL (ref 30.0–36.0)
MCV: 89 fL (ref 80.0–100.0)
Platelets: 159 10*3/uL (ref 150–400)
RBC: 2.83 MIL/uL — ABNORMAL LOW (ref 3.87–5.11)
RDW: 13.1 % (ref 11.5–15.5)
WBC: 11.9 10*3/uL — ABNORMAL HIGH (ref 4.0–10.5)
nRBC: 0 % (ref 0.0–0.2)

## 2021-09-18 LAB — VALPROIC ACID LEVEL: Valproic Acid Lvl: 43 ug/mL — ABNORMAL LOW (ref 50.0–100.0)

## 2021-09-18 IMAGING — MR MR MRA HEAD W/O CM
1 series · 19 of 48 positions shown · non-contrast
Comparison: Prior MRI from earlier the same day as well as previous
MRA from [DATE].

CLINICAL DATA: Follow-up examination for acute stroke.

EXAM:
MRA HEAD WITHOUT CONTRAST
TECHNIQUE: Angiographic images of the Circle of Willis were acquired using MRA
technique without intravenous contrast.

[Series 5: 3d cow · axial · 0.5mm · 0.41mm/px · z∈[-65,+29]mm · 19 of 204 slices shown]
[im 1/204]
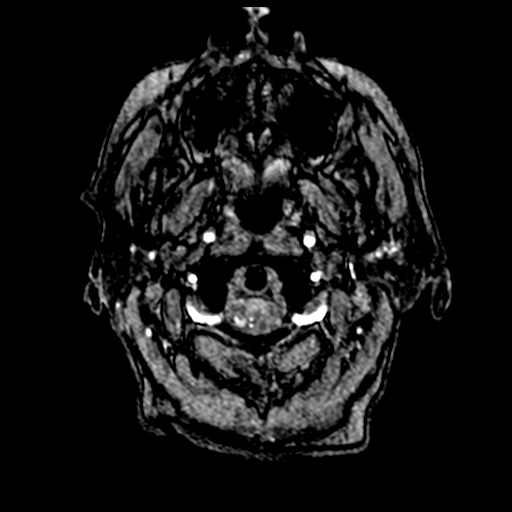
[im 5/204]
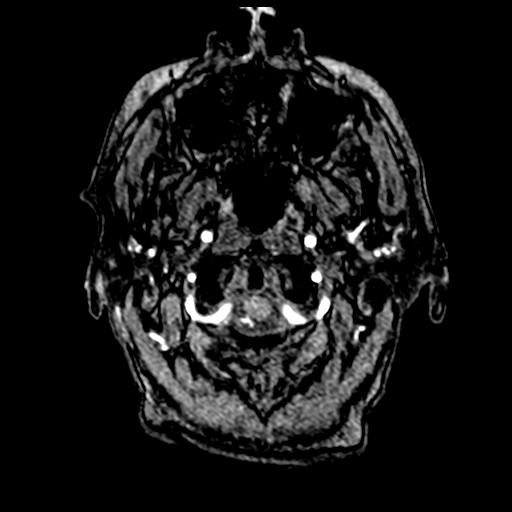
[im 9/204]
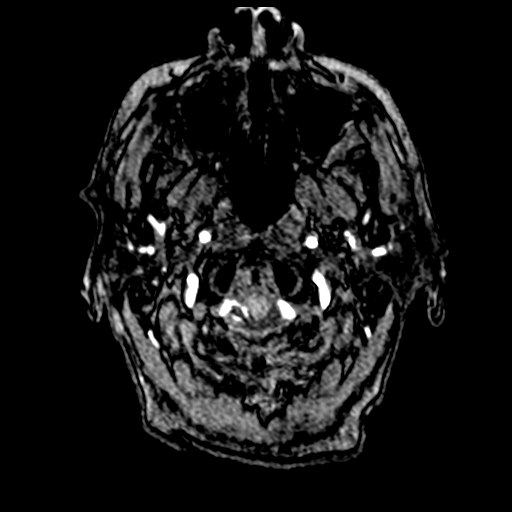
[im 13/204]
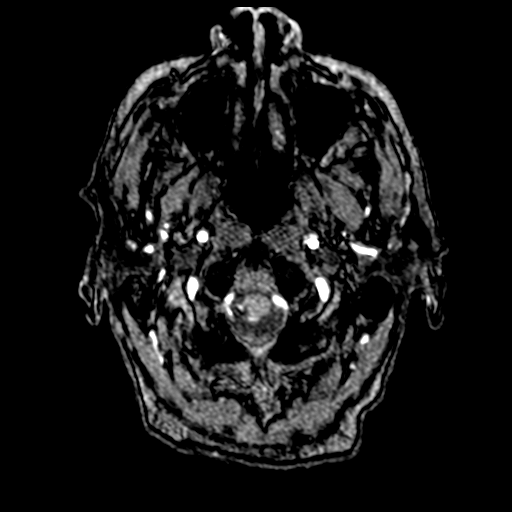
[im 18/204]
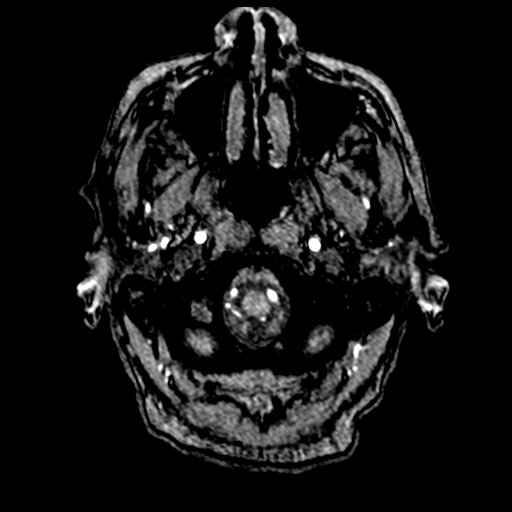
[im 22/204]
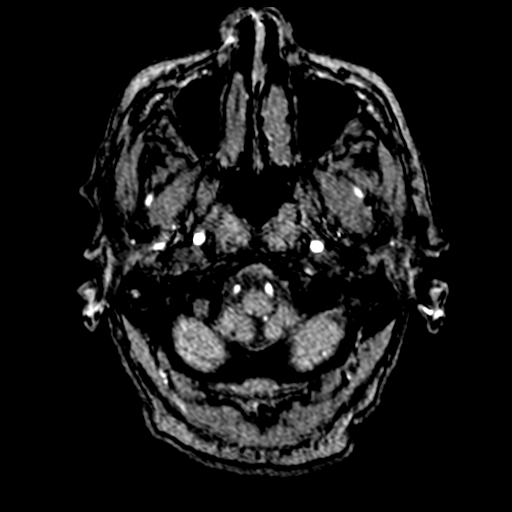
[im 26/204]
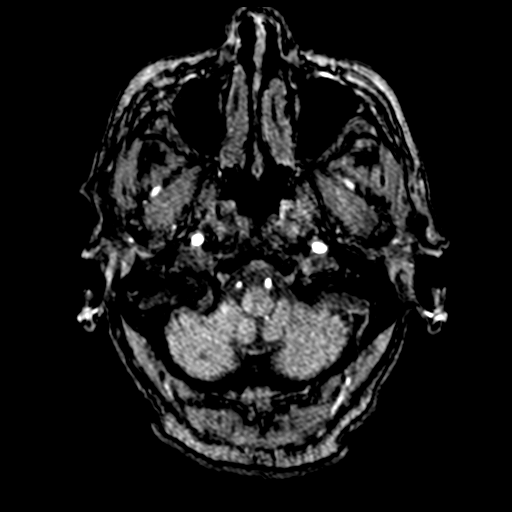
[im 31/204]
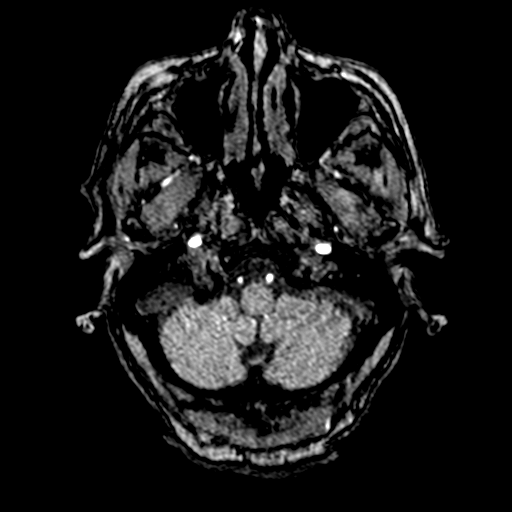
[im 35/204]
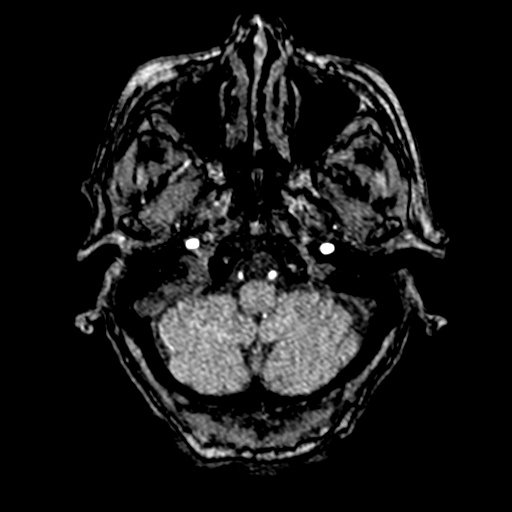
[im 39/204]
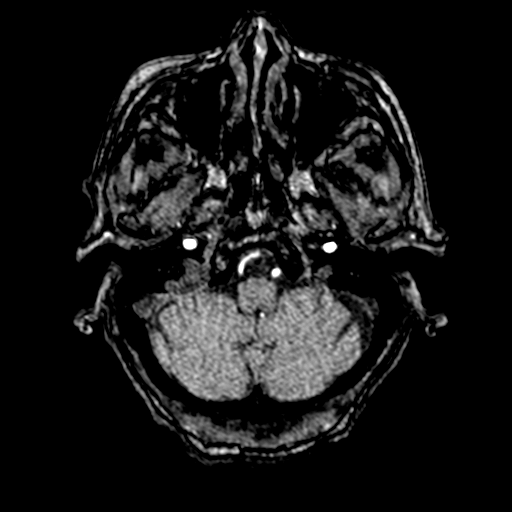
[im 44/204]
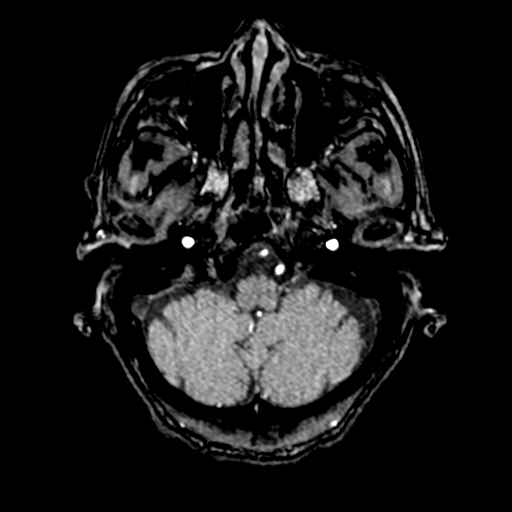
[im 65/204]
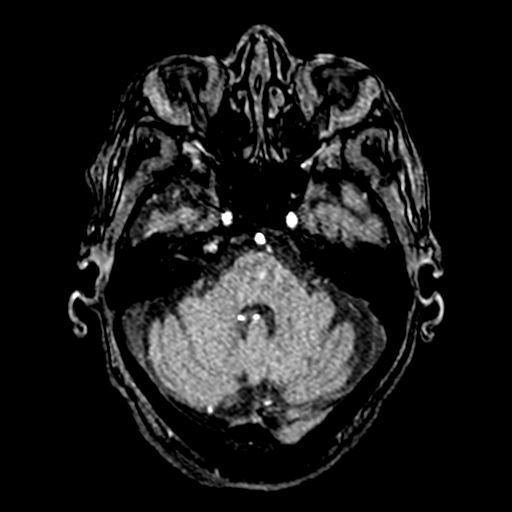
[im 91/204]
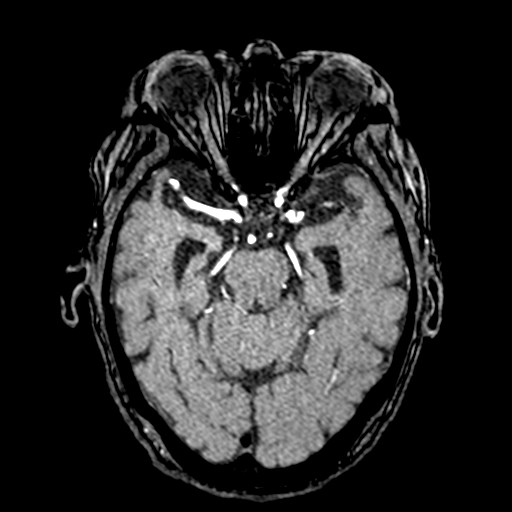
[im 104/204]
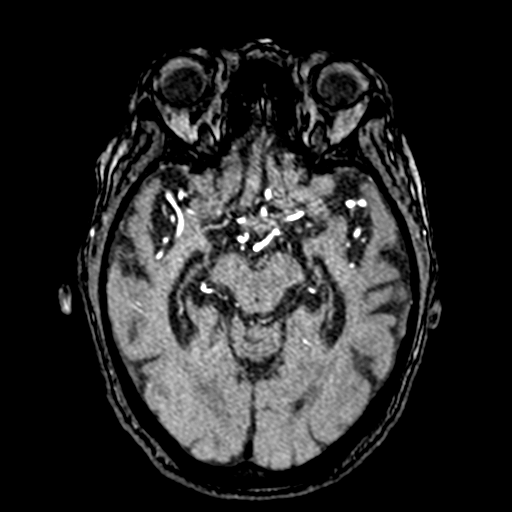
[im 117/204]
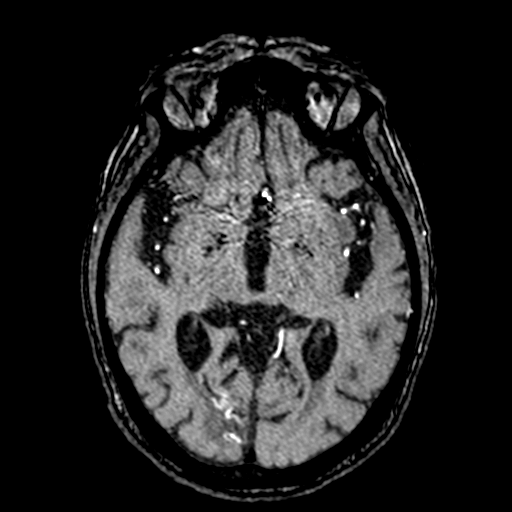
[im 143/204]
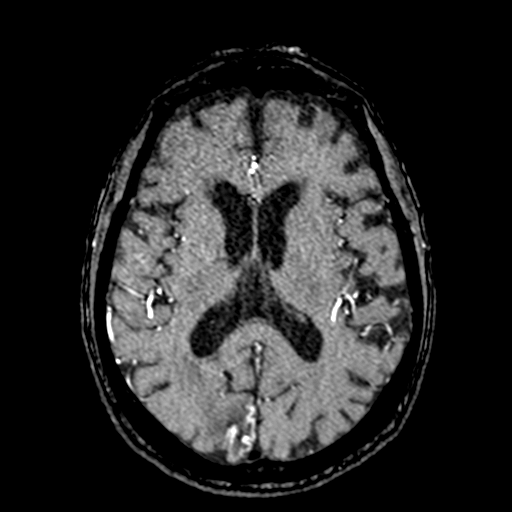
[im 169/204]
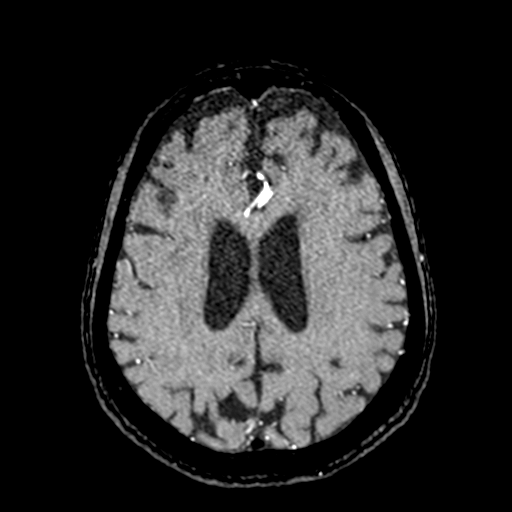
[im 173/204]
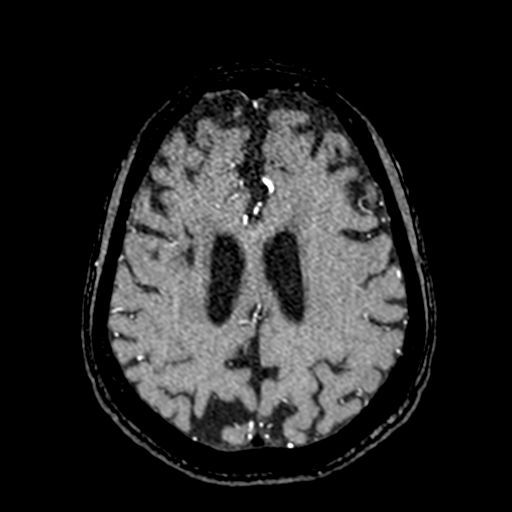
[im 195/204]
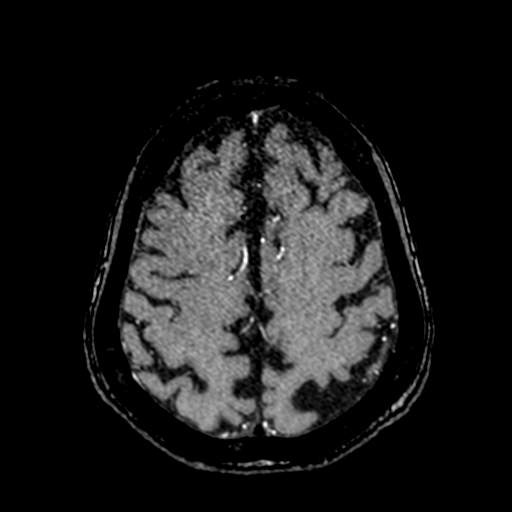

[19 of 48 positions shown; findings below may reference images not displayed]

FINDINGS: Anterior circulation: Both internal carotid arteries patent to the
termini without flow-limiting stenosis. Approximate 5 mm aneurysm
extending posteriorly and inferiorly from the supraclinoid left ICA,
relatively stable from prior when measured in a similar fashion.
This aneurysm appears somewhat irregular and possibly bilobed. A1
segments patent bilaterally. Normal anterior communicating artery
complex. Anterior cerebral arteries patent without stenosis. No M1
stenosis or occlusion. No proximal MCA branch occlusion. Distal MCA
branches perfused and symmetric.

Posterior circulation: Both vertebral arteries patent without
stenosis. Left vertebral artery dominant. Right PICA patent. Left
PICA origin not well seen. Basilar patent to its distal aspect
without stenosis. Superior cerebellar arteries patent bilaterally.
Both PCAs widely patent and well perfused or distal aspects.

Anatomic variants: As above.

Other: None.
IMPRESSION: 1. Negative intracranial MRA for large vessel occlusion or other
emergent finding. No hemodynamically significant or correctable
stenosis.
2. 5 mm left posterior communicating artery aneurysm, relatively
stable as compared to previous MRA from [DATE].

## 2021-09-18 IMAGING — MR MR HEAD W/O CM
16 of 17 series · 40 of 48 positions shown · non-contrast
Comparison: Prior MRI from [DATE].

CLINICAL DATA: Initial evaluation for neuro deficit, stroke
suspected.

EXAM:
MRI HEAD WITHOUT CONTRAST
TECHNIQUE: Multiplanar, multiecho pulse sequences of the brain and surrounding
structures were obtained without intravenous contrast.

[Series 5: DWI · axial · 3.0mm · 0.88mm/px · z∈[-60,+82]mm · 6 of 98 slices shown (1 of 4)]
[im 1/98]
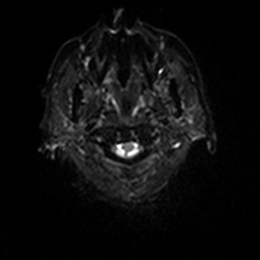
[im 20/98]
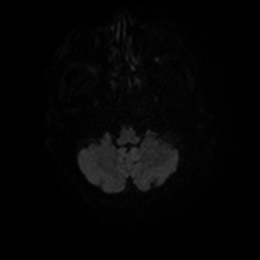
[im 39/98]
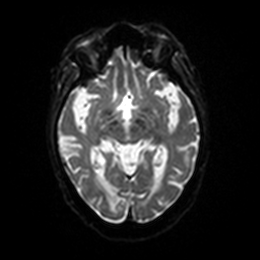
[im 59/98]
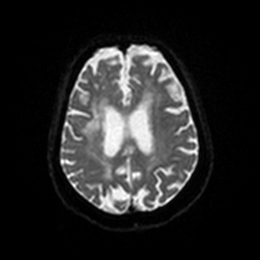
[im 78/98]
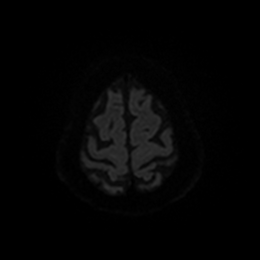
[im 98/98]
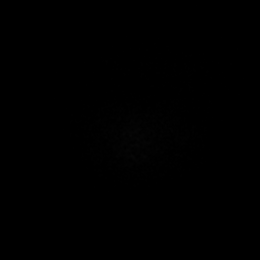

[Series 6: DWI · axial · 3.0mm · 0.88mm/px · z∈[-60,+82]mm · 2 of 49 slices shown (2 of 4)]
[im 1/49]
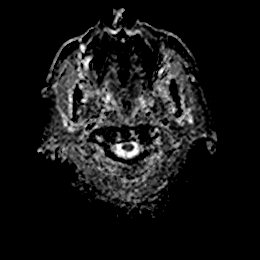
[im 49/49]
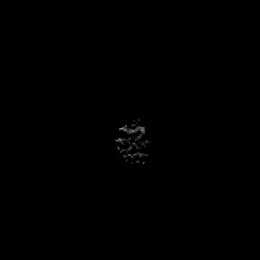

[Series 7: DWI · coronal · 4.0mm · 0.88mm/px · 3 of 72 slices shown (3 of 4)]
[im 1/72]
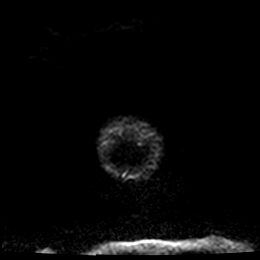
[im 36/72]
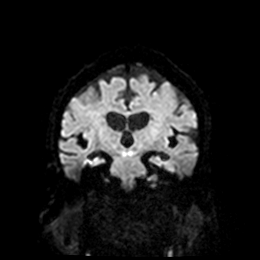
[im 72/72]
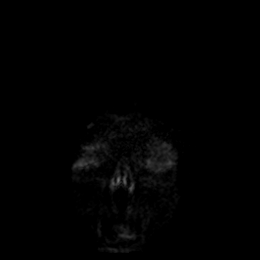

[Series 8: DWI · coronal · 4.0mm · 0.88mm/px · 2 of 36 slices shown (4 of 4)]
[im 1/36]
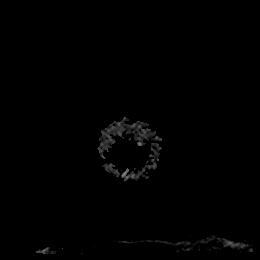
[im 36/36]
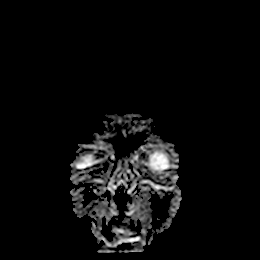

[Series 9: FLAIR · axial · 5.0mm · 0.45mm/px · 1 of 25 slices shown (1 of 2)]
[im 1/25]
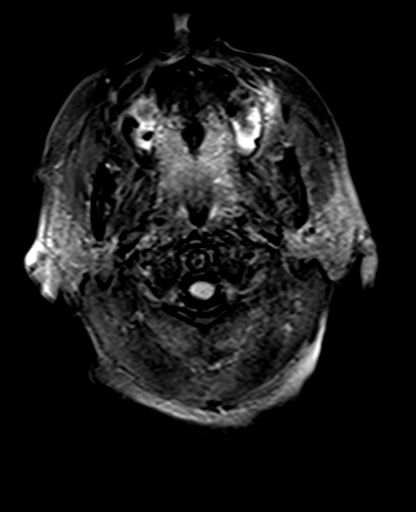

[Series 10: T2 · axial · 5.0mm · 0.72mm/px · 1 of 25 slices shown (1 of 3)]
[im 1/25]
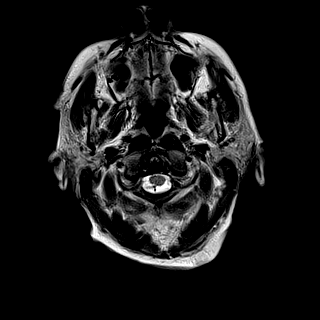

[Series 11: T1 · sagittal · 5.0mm · 0.75mm/px · 1 of 24 slices shown]
[im 1/24]
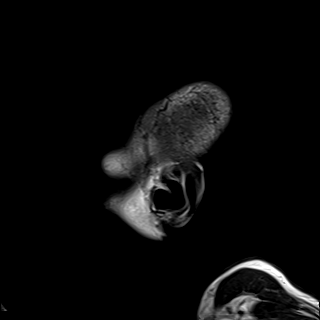

[Series 12: mag_images · axial · 3.0mm · 0.90mm/px · z∈[-62,+78]mm · 2 of 48 slices shown]
[im 1/48]
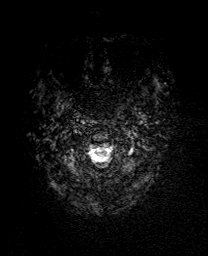
[im 48/48]
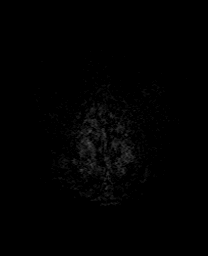

[Series 13: pha_images · axial · 3.0mm · 0.90mm/px · z∈[-62,+75]mm · 2 of 47 slices shown]
[im 1/47]
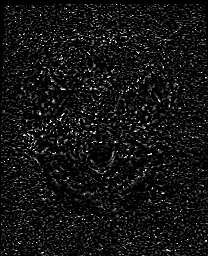
[im 47/47]
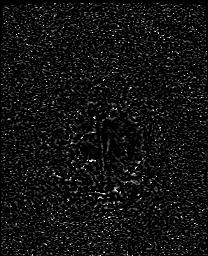

[Series 14: swi_images · axial · 3.0mm · 0.90mm/px · z∈[-62,+78]mm · 2 of 48 slices shown]
[im 1/48]
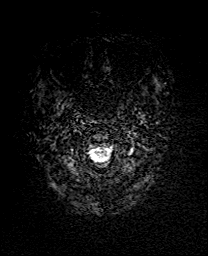
[im 48/48]
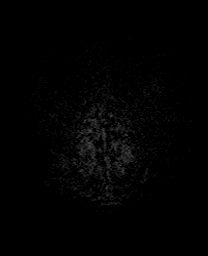

[Series 15: mip_images(sw) · axial · 24.0mm · 0.90mm/px · z∈[-52,+67]mm · 2 of 41 slices shown]
[im 1/41]
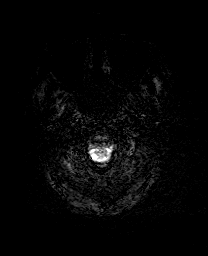
[im 41/41]
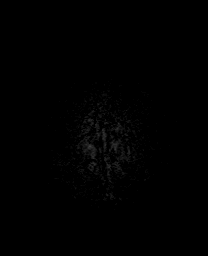

[Series 17: T2 · coronal · 5.0mm · 0.34mm/px · 1 of 30 slices shown (2 of 3)]
[im 1/30]
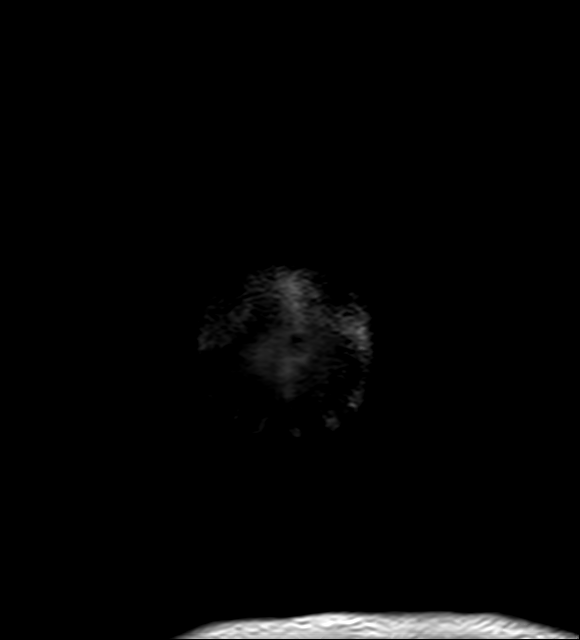

[Series 18: t1_mprage_tra_p2_iso · axial · 1.0mm · 0.98mm/px · z∈[-80,+78]mm · 8 of 160 slices shown]
[im 1/160]
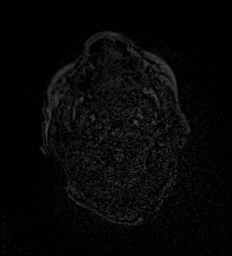
[im 23/160]
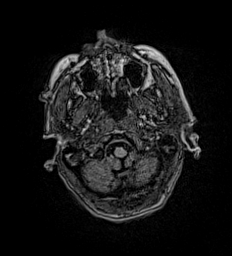
[im 46/160]
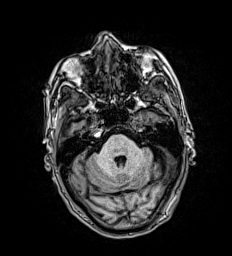
[im 69/160]
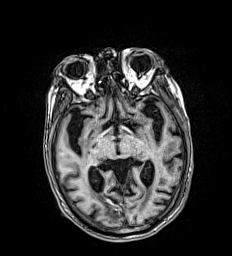
[im 91/160]
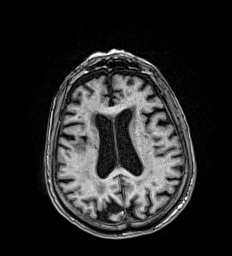
[im 114/160]
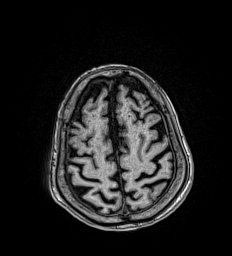
[im 137/160]
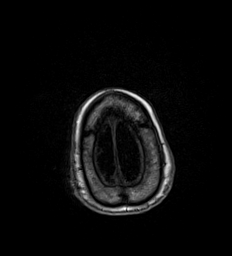
[im 160/160]
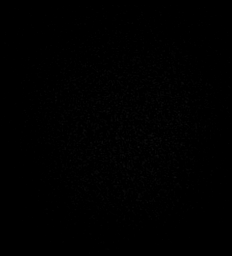

[Series 19: t1_mprage_tra_p2_iso_mpr_coronal · coronal · 1.0mm · 0.45mm/px · 3 of 190 slices shown]
[im 1/190]
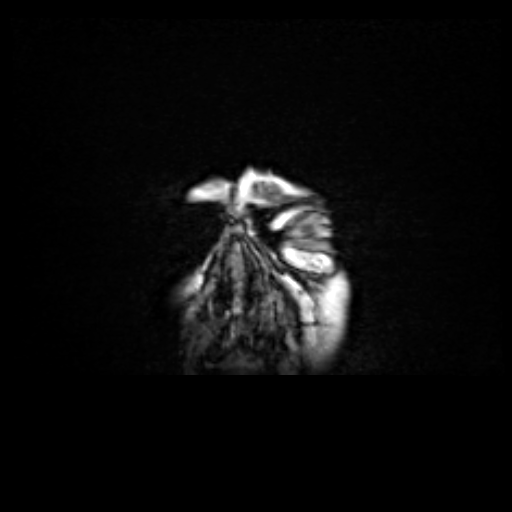
[im 24/190]
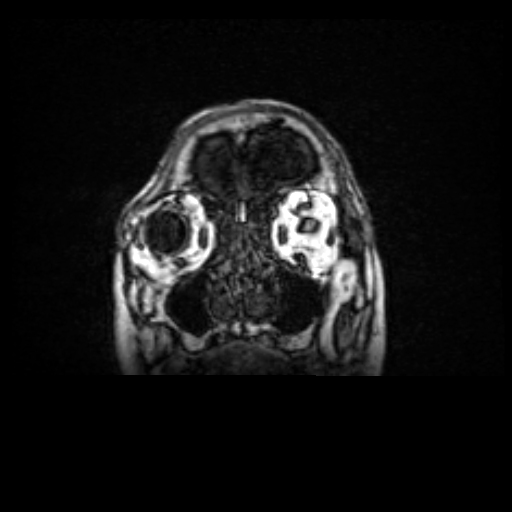
[im 48/190]
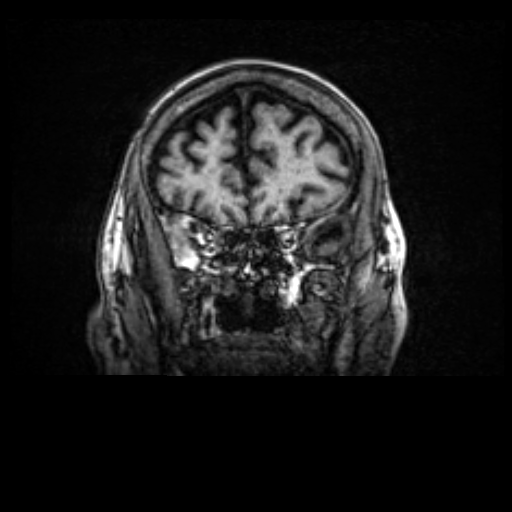

[Series 20: T2 · coronal · 3.0mm · 0.27mm/px · 2 of 38 slices shown (3 of 3)]
[im 1/38]
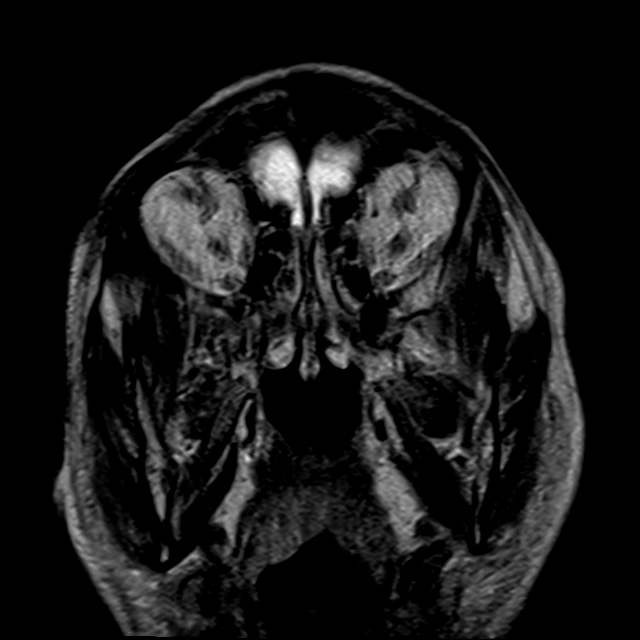
[im 38/38]
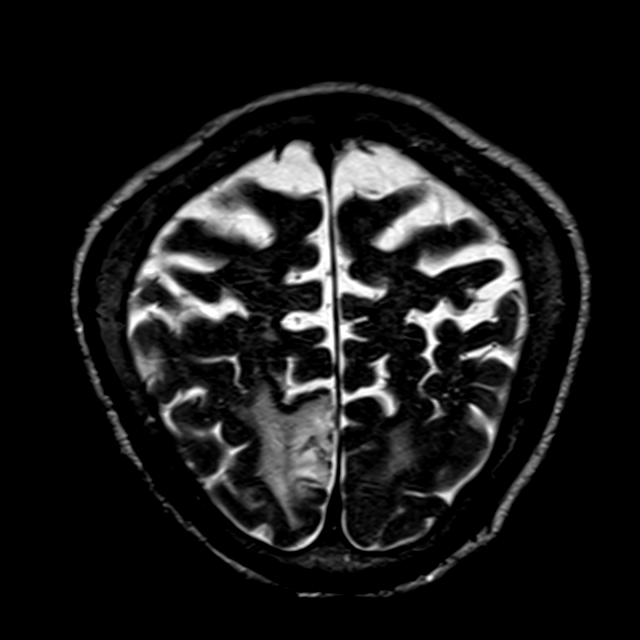

[Series 21: FLAIR · coronal · 3.0mm · 0.56mm/px · 2 of 38 slices shown (2 of 2)]
[im 1/38]
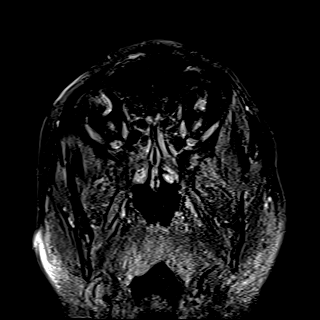
[im 38/38]
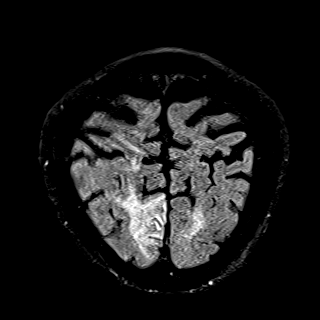

[40 of 48 positions shown; findings below may reference images not displayed]

FINDINGS: Brain: Cerebral volume within normal limits for age. Patchy and
confluent T2/FLAIR hyperintensity involving the periventricular and
deep white matter both cerebral hemispheres, most consistent with
chronic small vessel ischemic disease. Few scattered associated
remote lacunar infarcts present about the deep gray nuclei and left
cerebellum. Remote cortical subcortical right frontal lobe infarct
noted. Chronic right PCA distribution infarct with associated
laminar necrosis.

Few scattered subcentimeter foci of restricted diffusion seen
involving the cortical and subcortical right parieto-occipital
region (series 5, image 82, 84, 83, 76, 73), consistent with small
acute ischemic infarcts. Largest of these foci measures
approximately 6 mm. No associated hemorrhage or mass effect. These
are likely embolic in nature.

No other evidence for acute or subacute ischemia. No acute
intracranial hemorrhage. No mass lesion, midline shift or mass
effect. No hydrocephalus or extra-axial fluid collection. Pituitary
gland suprasellar region within normal limits.

Vascular: Major intracranial vascular flow voids are maintained.

Skull and upper cervical spine: Craniocervical junction within
normal limits. Bone marrow signal intensity normal. No scalp soft
tissue abnormality.

Sinuses/Orbits: Prior bilateral ocular lens replacement. Scattered
mucosal thickening noted about the ethmoidal air cells and maxillary
sinuses. No mastoid effusion. Inner ear structures grossly normal.

Other: None.
IMPRESSION: 1. Few scattered subcentimeter acute ischemic infarcts involving the
right parieto-occipital region as above, likely embolic in nature.
No associated hemorrhage or mass effect.
2. Chronic right MCA and PCA distribution infarcts.
3. Underlying chronic microvascular ischemic disease with a few
additional scattered remote lacunar infarcts about the deep gray
nuclei and left cerebellum.

## 2021-09-18 MED ORDER — CALCIUM CARBONATE 1250 (500 CA) MG PO TABS
1.0000 | ORAL_TABLET | Freq: Every day | ORAL | Status: DC
  Administered 2021-09-19 – 2021-09-24 (×6): 500 mg via ORAL
  Filled 2021-09-18 (×6): qty 1

## 2021-09-18 MED ORDER — SENNOSIDES-DOCUSATE SODIUM 8.6-50 MG PO TABS
1.0000 | ORAL_TABLET | Freq: Two times a day (BID) | ORAL | Status: DC
  Administered 2021-09-18 – 2021-09-23 (×10): 1 via ORAL
  Filled 2021-09-18 (×10): qty 1

## 2021-09-18 MED ORDER — ASPIRIN 81 MG PO TBEC: 81.0000 mg | DELAYED_RELEASE_TABLET | Freq: Two times a day (BID) | ORAL | Status: DC

## 2021-09-18 MED ORDER — NEPRO/CARBSTEADY PO LIQD
237.0000 mL | Freq: Three times a day (TID) | ORAL | Status: DC
  Administered 2021-09-18 – 2021-09-23 (×13): 237 mL via ORAL
  Filled 2021-09-18: qty 237

## 2021-09-18 MED ORDER — VALPROIC ACID 250 MG PO CAPS
250.0000 mg | ORAL_CAPSULE | Freq: Three times a day (TID) | ORAL | Status: DC
Start: 2021-09-18 — End: 2021-09-19
  Administered 2021-09-18 (×2): 250 mg via ORAL
  Filled 2021-09-18 (×3): qty 1

## 2021-09-18 MED ORDER — CHOLECALCIFEROL 10 MCG/ML (400 UNIT/ML) PO LIQD
1200.0000 [IU] | Freq: Every day | ORAL | Status: DC
  Administered 2021-09-18: 1200 [IU] via ORAL
  Filled 2021-09-18 (×2): qty 3

## 2021-09-18 MED ORDER — VITAMIN D 25 MCG (1000 UNIT) PO TABS
1000.0000 [IU] | ORAL_TABLET | Freq: Every day | ORAL | Status: DC
Start: 2021-09-19 — End: 2021-09-24
  Administered 2021-09-19 – 2021-09-24 (×6): 1000 [IU] via ORAL
  Filled 2021-09-18 (×6): qty 1

## 2021-09-18 MED ORDER — ASPIRIN 81 MG PO TBEC
81.0000 mg | DELAYED_RELEASE_TABLET | Freq: Two times a day (BID) | ORAL | Status: DC
Start: 2021-09-18 — End: 2021-09-19
  Administered 2021-09-18 – 2021-09-19 (×2): 81 mg via ORAL
  Filled 2021-09-18 (×2): qty 1

## 2021-09-18 NOTE — Assessment & Plan Note (Addendum)
Will follow up with neurology as outpatient for new CVA. Neurology s/o

## 2021-09-18 NOTE — Progress Notes (Addendum)
2D echocardiogram completed.  09/18/2021 3:37 PM Kelby Aline., MHA, RVT, RDCS, RDMS

## 2021-09-18 NOTE — Progress Notes (Signed)
FPTS Brief Progress Note  S:patient sleeping   O: BP (!) 138/50 (BP Location: Left Arm)   Pulse 97   Temp 98.3 F (36.8 C) (Oral)   Resp 16   Ht 4\' 9"  (1.448 m)   Wt 41.8 kg   SpO2 92%   BMI 19.94 kg/m     A/P: - Orders reviewed. Labs for AM ordered, which was adjusted as needed.   Gladys Damme, MD 09/18/2021, 1:32 AM PGY-3, [facility] Night Resident  Please page [phone] with questions.

## 2021-09-18 NOTE — Progress Notes (Signed)
Daily Progress Note Intern Pager: [phone]  Patient name: Tracy [name] Medical record number: [id number] Date of birth: 12/19/1919 Age: 88 y.o. Gender: female  Primary Care Provider: Charlane Ferretti, MD Consultants: Orthopedic surgery, neurology Code Status: DNR  Pt Overview and Major Events to Date:  6/11: Admitted 6/12: To OR for total left hip arthroplasty 6/12: Witnessed seizure like activity; neurology consulted and EEG completed 6/13: Second witnessed seizure-like activity lasting 1.5 minutes  Assessment and Plan:  Tracy [name] is a 88 year old female who presented with a femoral neck fracture after fall from standing without loss of consciousness who is now POD 2 left hemiarthroplasty. Pertinent PMH/PSH includes multiple strokes complicated by vascular dementia, CKD stage IV, HTN, and HLD.   * Displaced fracture of left femoral neck ([place]) Patient presented with mechanical fall from standing as she turned around to walk without her walker.  X-ray of left femur in the ED with displaced fracture in the neck of the proximal left femur. Orthopedics consulted in the ED who completed total left arthroplasty; appreciate assistance in patient's care. - Pain control with Tylenol 650 mg every 6 hours and Roxicodone 2.5 mg every 6 hours as needed breakthrough pain -PT/OT -Fall precautions -Delirium precautions  Primary hypertension 24-hour BP 121-138/50-70, most recent 137/70. Home losartan discontinued in light of renal function. - Continue amlodipine 10 mg daily  Chronic renal failure syndrome Avoid nephrotoxic agents.  Switched home losartan to amlodipine as aforementioned.  Creatinine today 2.79; baseline appears 1.9-2.2. - Continue maintenance IVF at 50 mL/h; will discontinue when patient increases p.o. intake.  Acute embolic stroke [facility]) MRI brain revealing few scattered subcentimeter acute ischemic infarcts of the right parieto-occipital region without hemorrhage or mass  effect.  Possible source of seizure-like activity; neurology following.  Osteoporosis Patient with L hip fracture after fall from standing without significant force. -Start cholecalciferol 1200 units daily  Witnessed seizure-like activity ([place]) Overnight 6/12, patient with witnessed 3-minute seizure-like activity episode, and 1 to 2-hour postictal state.  Neurology consulted and made the following recommendations: Depakote 250 mg p.o. 3 times daily (VPA 43); magnesium, AST, ALT, and ammonia all largely WNL; inpatient seizure precautions.  Appreciate neurology's assistance in this patient's care. 6/13, patient with a second witnessed seizure-like episode lasting 1.5 minutes.  See progress note from this writer on 6/13 for detailed event.  Afterwards, EEG obtained which showed the following: Suggestive of cortical dysfunction from the R frontotemporal region and post-ictal state. No seizures or epileptiform discharges seen.   Vascular dementia [facility]) On assessment 6/12 AM, patient oriented to self (name but not age-stated 89), place, and year (initially stated 1991, but caught herself and stated 25 without prompting).  On admission, patient was oriented to situation.  Per family, this appears to be her baseline.  This morning, patient oriented to self (including age), place (city and state), and year.  Fall Presented with a fall from standing after tripping while turning without the assistance of her walker.  Hypoxia-resolved as of 09/17/2021 Patient was not hypoxic on admission.  Took 1 dose of fentanyl in ED, and desat to 79% approximately 20 minutes after medication.  Restored sats with 2 LNC.  Suspect due to fentanyl dose, but in light of patient's age and long bone fracture, considered VQ scan. - VQ scan not obtained, as symptoms resolved by the time schedule permitted scan to be completed.   Chronic conditions: Gout-allopurinol 200 mg daily History of CVA HLD-aspirin 81 mg and Lipitor 20  mg Dementia-memantine 10 mg GERD-Protonix 40 mg    FEN/GI: Regular PPx: SCDs, teds Dispo:Pending PT recommendations  pending clinical improvement . Barriers include ambulation with PT.   Subjective:  This morning, patient is alert and oriented to person, time, and place.  She appears to be back at her baseline, and denies acute concerns or complaints today.  She reports that her pain is well controlled on current regimen and that she feels like moving around today.  Objective: Temp:  [98.2 F (36.8 C)-98.5 F (36.9 C)] 98.3 F (36.8 C) (06/14 0358) Pulse Rate:  [91-97] 91 (06/14 0358) Resp:  [16-18] 16 (06/14 0358) BP: (121-138)/(50-70) 137/70 (06/14 0358) SpO2:  [92 %-98 %] 98 % (06/14 0358) Physical Exam: General: Alert, cooperative, pleasant.  Appears to be back at baseline.  NAD Cardiovascular: RRR Respiratory: Normal WOB on room air Abdomen: Soft, NT/ND.  Palpable stool Extremities: Nonedematous BLE  Laboratory: Most recent CBC Lab Results  Component Value Date   WBC 11.9 (H) 09/18/2021   HGB 8.3 (L) 09/18/2021   HCT 25.2 (L) 09/18/2021   MCV 89.0 09/18/2021   PLT 159 09/18/2021   Most recent BMP    Latest Ref Rng & Units 09/18/2021    5:18 AM  BMP  Glucose 70 - 99 mg/dL 95   BUN 8 - 23 mg/dL 28   Creatinine 0.44 - 1.00 mg/dL 2.29   Sodium 135 - 145 mmol/L 136   Potassium 3.5 - 5.1 mmol/L 3.9   Chloride 98 - 111 mmol/L 104   CO2 22 - 32 mmol/L 23   Calcium 8.9 - 10.3 mg/dL 8.1      Imaging/Diagnostic Tests: MRI brain IMPRESSION: 1. Few scattered subcentimeter acute ischemic infarcts involving the right parieto-occipital region as above, likely embolic in nature. No associated hemorrhage or mass effect. 2. Chronic right MCA and PCA distribution infarcts. 3. Underlying chronic microvascular ischemic disease with a few additional scattered remote lacunar infarcts about the deep gray nuclei and left cerebellum.     Electronically Signed   By:  Jeannine Boga M.D.   On: 09/18/2021 02:43   Rosezetta Schlatter, MD 09/18/2021, 11:18 AM  PGY-1, [place] Intern pager: [phone], text pages welcome Secure chat group [place]

## 2021-09-18 NOTE — Evaluation (Signed)
Physical Therapy Evaluation Patient Details Name: Tracy [name] MRN: [id number] DOB: 09-27-1919 Today's Date: 09/18/2021  History of Present Illness  Pt is a 88 y/o F presenting to ED on 6/11 after fall at home. X ray of L femur revealing displaced fx of proximal femur. S/p L hip hemiarthroplasty anterior approach on 6/12, seizure like activity on 6/13. EEG revealing cortical dysfunction arising from R frontotemporal region and moderate diffuse encephalopathy. PMH includes L PCA CVA (2022), CKD 4, HTN, HLD, gout, and vascular dementia.  Clinical Impression  Patient presents with decreased mobility due to pain, decreased ROM And strength L LE.  She previously lived alone and mobilized with RW with aide available for IADL's as needed.  She needed +2 for OOB to chair and mod A overall for bed mobility.  She will benefit from skilled PT in the acute setting to allow return home with assist following STSNF level rehab stay.        Recommendations for follow up therapy are one component of a multi-disciplinary discharge planning process, led by the attending physician.  Recommendations may be updated based on patient status, additional functional criteria and insurance authorization.  Follow Up Recommendations Skilled nursing-short term rehab (<3 hours/day)    Assistance Recommended at Discharge Frequent or constant Supervision/Assistance  Patient can return home with the following  Two people to help with walking and/or transfers;Assistance with cooking/housework;Direct supervision/assist for medications management;Assist for transportation;Help with stairs or ramp for entrance;A lot of help with bathing/dressing/bathroom    Equipment Recommendations None recommended by PT  Recommendations for Other Services       Functional Status Assessment Patient has had a recent decline in their functional status and/or demonstrates limited ability to make significant improvements in function in a  reasonable and predictable amount of time     Precautions / Restrictions Precautions Precautions: Fall Restrictions Weight Bearing Restrictions: Yes LLE Weight Bearing: Weight bearing as tolerated      Mobility  Bed Mobility Overal bed mobility: Needs Assistance Bed Mobility: Sidelying to Sit, Sit to Sidelying   Sidelying to sit: Mod assist     Sit to sidelying: Mod assist General bed mobility comments: for trunk elevation/BLE mgmt    Transfers Overall transfer level: Needs assistance Equipment used: Rolling walker (2 wheels) Transfers: Sit to/from Stand, Bed to chair/wheelchair/BSC Sit to Stand: Mod assist, +2 physical assistance Stand pivot transfers: Mod assist, +2 physical assistance         General transfer comment: increased time and cuing for step sequencing    Ambulation/Gait               General Gait Details: NT due to pain  Stairs            Wheelchair Mobility    Modified Rankin (Stroke Patients Only)       Balance Overall balance assessment: Needs assistance Sitting-balance support: Feet unsupported, Bilateral upper extremity supported, Feet supported Sitting balance-Leahy Scale: Poor Sitting balance - Comments: posterior and R lateral lean, possibly to offload L hip Postural control: Right lateral lean, Posterior lean Standing balance support: Bilateral upper extremity supported, During functional activity, Reliant on assistive device for balance Standing balance-Leahy Scale: Poor Standing balance comment: reliant on external support                             Pertinent Vitals/Pain Pain Assessment Pain Score: 6  Pain Location: LLE with mobililty Pain Descriptors / Indicators:  Discomfort, Grimacing Pain Intervention(s): Monitored during session, Repositioned, Premedicated before session    [place] expects to be discharged to:: Private residence Living Arrangements: Alone Available Help at  Discharge: Family;Personal care attendant (aide is available during the day and assists with IADL's) Type of Home: House Home Access: Stairs to enter Entrance Stairs-Rails: Psychiatric nurse of Steps: 2   Home Layout: One level Home Equipment: Shower seat - built Medical sales representative (2 wheels)      Prior Function Prior Level of Function : Independent/Modified Independent;History of Falls (last six months)             Mobility Comments: RW at all times ADLs Comments: niece assits with grocery shopping, and med mgmt     Hand Dominance   Dominant Hand: Right    Extremity/Trunk Assessment   Upper Extremity Assessment Upper Extremity Assessment: Defer to OT evaluation    Lower Extremity Assessment Lower Extremity Assessment: LLE deficits/detail LLE Deficits / Details: AROM limited by pain with hip flexion, exends knee antigravity, ankle WFL, hip limited by pain    Cervical / Trunk Assessment Cervical / Trunk Assessment: Kyphotic  Communication   Communication: No difficulties  Cognition Arousal/Alertness: Awake/alert Behavior During Therapy: Flat affect Overall Cognitive Status: History of cognitive impairments - at baseline                                 General Comments: pt with increased time for command following, oriented to self and place, unable to recall why she came to hospital        General Comments General comments (skin integrity, edema, etc.): VSS on RA, family in the room initially for PLOF; incontinent of urine upon standing so in chair for bed change, back to bed due to testing per RN    Exercises     Assessment/Plan    PT Assessment Patient needs continued PT services  PT Problem List Decreased range of motion;Decreased mobility;Decreased knowledge of use of DME;Decreased strength;Decreased balance;Decreased activity tolerance;Decreased safety awareness       PT Treatment Interventions Functional mobility  training;DME instruction;Balance training;Gait training;Therapeutic activities;Patient/family education;Therapeutic exercise    PT Goals (Current goals can be found in the Care Plan section)  Acute Rehab PT Goals Patient Stated Goal: to improve mobility PT Goal Formulation: With patient/family Time For Goal Achievement: 10/02/21 Potential to Achieve Goals: Fair    Frequency Min 3X/week     Co-evaluation PT/OT/SLP Co-Evaluation/Treatment: Yes Reason for Co-Treatment: Complexity of the patient's impairments (multi-system involvement);For patient/therapist safety PT goals addressed during session: Mobility/safety with mobility;Balance;Proper use of DME         AM-PAC PT "6 Clicks" Mobility  Outcome Measure Help needed turning from your back to your side while in a flat bed without using bedrails?: A Lot Help needed moving from lying on your back to sitting on the side of a flat bed without using bedrails?: A Lot Help needed moving to and from a bed to a chair (including a wheelchair)?: Total Help needed standing up from a chair using your arms (e.g., wheelchair or bedside chair)?: Total Help needed to walk in hospital room?: Total Help needed climbing 3-5 steps with a railing? : Total 6 Click Score: 8    End of Session Equipment Utilized During Treatment: Gait belt Activity Tolerance: Patient limited by pain Patient left: in bed;with call bell/phone within reach;with bed alarm set   PT Visit Diagnosis:  Other abnormalities of gait and mobility (R26.89);History of falling (Z91.81);Muscle weakness (generalized) (M62.81);Pain Pain - Right/Left: Left Pain - part of body: Hip    Time: [id number] PT Time Calculation (min) (ACUTE ONLY): 26 min   Charges:   PT Evaluation $PT Eval Moderate Complexity: 1 Mod          Cyndi Delbra Zellars, PT Acute Rehabilitation Services [id number]:[phone] Office:[phone] 09/18/2021   [name] 09/18/2021, 9:22 PM

## 2021-09-18 NOTE — Plan of Care (Signed)
Patient is alert to herself most of the time. Consumed her dinner atleast 50%. No seizure noted during the shift. Not in pain. MRI done. Will continue to monitor.  Problem: Education: Goal: Knowledge of General Education information will improve Description: Including pain rating scale, medication(s)/side effects and non-pharmacologic comfort measures Outcome: Progressing   Problem: Clinical Measurements: Goal: Ability to maintain clinical measurements within normal limits will improve Outcome: Progressing Goal: Will remain free from infection Outcome: Progressing Goal: Respiratory complications will improve Outcome: Progressing Goal: Cardiovascular complication will be avoided Outcome: Progressing   Problem: Activity: Goal: Risk for activity intolerance will decrease Outcome: Progressing   Problem: Nutrition: Goal: Adequate nutrition will be maintained Outcome: Progressing   Problem: Pain Managment: Goal: General experience of comfort will improve Outcome: Progressing   Problem: Safety: Goal: Ability to remain free from injury will improve Outcome: Progressing   Problem: Skin Integrity: Goal: Risk for impaired skin integrity will decrease Outcome: Progressing

## 2021-09-18 NOTE — Progress Notes (Signed)
Carotid duplex bilateral study completed.   Please see CV Proc for preliminary results.   Edy Mcbane, RDMS, RVT  

## 2021-09-18 NOTE — Assessment & Plan Note (Addendum)
-  Continue vitamin D3 1000 units daily and calcium carbonate 500 mg daily

## 2021-09-18 NOTE — Evaluation (Signed)
Occupational Therapy Evaluation Patient Details Name: Tracy [name] MRN: [id number] DOB: [birth year] Today's Date: 09/18/2021   History of Present Illness Pt is a 88 y/o F presenting to ED on 6/11 after fall at home. X ray of L femur revealing displaced fx of proximal femur. S/p L hip hemiarthroplasty anterior approach on 6/12, seizure like activity on 6/13. EEG revealing cortical dysfunction arising from R frontotemporal region and moderate diffuse encephalopathy. PMH includes L PCA CVA (2022), CKD 4, HTN, HLD, gout, and vascular dementia.   Clinical Impression   Pt independent at baseline with ADLs and uses RW for functional mobility. Pt lives alone, but has care from an aide and family members who are there 8+hrs/day to provide supervision for pt and assist with some IADLs. Pt is alone at night. Pt currently oriented to self and place, following commands with increased time. Pt keeping eyes closed frequently during session, overshooting/undershooting when reaching for targets with BUE. Pt min-mod A for ADLs, mod A for bed mobility, and mod A +2 for transfers with RW. Pt presenting with impairments listed below, will follow acutely. Recommend SNF at d/c.      Recommendations for follow up therapy are one component of a multi-disciplinary discharge planning process, led by the attending physician.  Recommendations may be updated based on patient status, additional functional criteria and insurance authorization.   Follow Up Recommendations  Skilled nursing-short term rehab (<3 hours/day)    Assistance Recommended at Discharge Frequent or constant Supervision/Assistance  Patient can return home with the following A lot of help with walking and/or transfers;A lot of help with bathing/dressing/bathroom;Assistance with cooking/housework;Direct supervision/assist for medications management;Direct supervision/assist for financial management;Assist for transportation;Help with stairs or ramp for  entrance    Functional Status Assessment  Patient has had a recent decline in their functional status and demonstrates the ability to make significant improvements in function in a reasonable and predictable amount of time.  Equipment Recommendations  BSC/3in1    Recommendations for Other Services PT consult     Precautions / Restrictions Precautions Precautions: Fall Restrictions Weight Bearing Restrictions: Yes LLE Weight Bearing: Weight bearing as tolerated      Mobility Bed Mobility Overal bed mobility: Needs Assistance Bed Mobility: Sidelying to Sit, Sit to Sidelying   Sidelying to sit: Mod assist     Sit to sidelying: Mod assist General bed mobility comments: for trunk elevation/BLE mgmt    Transfers Overall transfer level: Needs assistance Equipment used: Rolling walker (2 wheels) Transfers: Sit to/from Stand, Bed to chair/wheelchair/BSC Sit to Stand: Mod assist, +2 physical assistance Stand pivot transfers: Mod assist, +2 physical assistance         General transfer comment: increased time and cuing for step sequencing      Balance Overall balance assessment: Needs assistance Sitting-balance support: Feet unsupported, Bilateral upper extremity supported, Feet supported Sitting balance-Leahy Scale: Poor Sitting balance - Comments: posterior and R lateral lean, possibly to offload L hip Postural control: Right lateral lean, Posterior lean Standing balance support: Bilateral upper extremity supported, During functional activity, Reliant on assistive device for balance Standing balance-Leahy Scale: Poor Standing balance comment: reliant on external support                           ADL either performed or assessed with clinical judgement   ADL Overall ADL's : Needs assistance/impaired Eating/Feeding: Modified independent;Sitting   Grooming: Minimal assistance;Sitting;Standing   Upper Body Bathing: Minimal assistance;Sitting;Standing  Lower Body Bathing: Moderate assistance;Sitting/lateral leans   Upper Body Dressing : Minimal assistance;Sitting   Lower Body Dressing: Sitting/lateral leans;Sit to/from stand;Maximal assistance   Toilet Transfer: Moderate assistance;+2 for physical assistance;Rolling walker (2 wheels);BSC/3in1;Stand-pivot Toilet Transfer Details (indicate cue type and reason): simulated x3 Toileting- Clothing Manipulation and Hygiene: Moderate assistance;Sitting/lateral lean;Sit to/from stand Toileting - Clothing Manipulation Details (indicate cue type and reason): for pericare     Functional mobility during ADLs: Moderate assistance;+2 for safety/equipment       Vision   Vision Assessment?: Vision impaired- to be further tested in functional context Additional Comments: hx of L visual field ?cut vs deficits. pt overshooting/undershooting when reaching for targets on R and L side during session; pt frequently closing eyes during session, difficulty participating in visual assessment     Perception     Praxis      Pertinent Vitals/Pain Pain Assessment Pain Assessment: Faces Pain Score: 6  Faces Pain Scale: Hurts even more Pain Location: LLE with mobililty Pain Descriptors / Indicators: Discomfort, Grimacing Pain Intervention(s): Limited activity within patient's tolerance, Monitored during session, Premedicated before session, Repositioned     Hand Dominance Right   Extremity/Trunk Assessment Upper Extremity Assessment Upper Extremity Assessment: Generalized weakness (R>L)   Lower Extremity Assessment Lower Extremity Assessment: Defer to PT evaluation       Communication Communication Communication: No difficulties   Cognition Arousal/Alertness: Awake/alert Behavior During Therapy: Flat affect Overall Cognitive Status: History of cognitive impairments - at baseline                                 General Comments: pt with increased time for command following,  oriented to self and place, unable to recall why she came to hospital     General Comments  VSS on RA; family in room to confirm PLOF    Exercises     Shoulder Instructions      Home Living Family/patient expects to be discharged to:: Private residence Living Arrangements: Alone Available Help at Discharge: Family;Personal care attendant (aide is available all day long, assists with some IADLs) Type of Home: House Home Access: Stairs to enter [employer] of Steps: 2   Home Layout: One level     Bathroom Shower/Tub: Teacher, early years/pre: Handicapped height Bathroom Accessibility: Yes How Accessible: Accessible via walker Home Equipment: Shower seat - built in          Prior Functioning/Environment Prior Level of Function : Independent/Modified Independent;History of Falls (last six months)             Mobility Comments: RW at all times ADLs Comments: niece assits with grocery shopping, and med mgmt        OT Problem List: Decreased strength;Decreased range of motion;Decreased activity tolerance;Impaired balance (sitting and/or standing);Impaired vision/perception;Decreased cognition;Decreased safety awareness;Decreased knowledge of use of DME or AE      OT Treatment/Interventions: Self-care/ADL training;Therapeutic exercise;Therapeutic activities;Patient/family education;Balance training;DME and/or AE instruction    OT Goals(Current goals can be found in the care plan section) Acute Rehab OT Goals Patient Stated Goal: none stated OT Goal Formulation: With patient Time For Goal Achievement: 10/02/21 Potential to Achieve Goals: Good ADL Goals Pt Will Perform Lower Body Dressing: with supervision;sit to/from stand;sitting/lateral leans Pt Will Transfer to Toilet: with min assist;ambulating;regular height toilet;bedside commode Additional ADL Goal #1: pt will complete bed mobility with min A in prep for ADLs Additional ADL Goal #2:  Pt will  be able to stand for 5 mins with min guard A in prep for ADLs  OT Frequency: Min 2X/week    Co-evaluation PT/OT/SLP Co-Evaluation/Treatment: Yes Reason for Co-Treatment: Complexity of the patient's impairments (multi-system involvement);For patient/therapist safety;To address functional/ADL transfers   OT goals addressed during session: ADL's and self-care;Proper use of Adaptive equipment and DME      AM-PAC OT "6 Clicks" Daily Activity     Outcome Measure Help from another person eating meals?: None Help from another person taking care of personal grooming?: A Little Help from another person toileting, which includes using toliet, bedpan, or urinal?: A Lot Help from another person bathing (including washing, rinsing, drying)?: A Lot Help from another person to put on and taking off regular upper body clothing?: A Lot Help from another person to put on and taking off regular lower body clothing?: A Lot 6 Click Score: 15   End of Session Equipment Utilized During Treatment: Gait belt;Rolling walker (2 wheels) Nurse Communication: Mobility status  Activity Tolerance: Patient tolerated treatment well Patient left: in bed;with call bell/phone within reach;with bed alarm set  OT Visit Diagnosis: Unsteadiness on feet (R26.81);Other abnormalities of gait and mobility (R26.89);Muscle weakness (generalized) (M62.81);Other symptoms and signs involving cognitive function                Time: 8616-8372 OT Time Calculation (min): 40 min Charges:  OT General Charges $OT Visit: 1 Visit OT Evaluation $OT Eval Moderate Complexity: 1 Mod OT Treatments $Self Care/Home Management : 8-22 mins  Lynnda Child, OTD, OTR/L Acute Rehab [phone]) [phone] [place] 09/18/2021, 2:33 PM

## 2021-09-18 NOTE — Progress Notes (Signed)
    Subjective:  Patient is a poor historian due to underlying vascular dementia  Patient does not report pain.  Woke patient up for exam this morning.   Objective:   VITALS:   Vitals:   09/17/21 1500 09/17/21 2029 09/18/21 0111 09/18/21 0358  BP: 130/60 (!) 121/56 (!) 138/50 137/70  Pulse: 97 95 97 91  Resp: 16 18 16 16   Temp: 98.2 F (36.8 C) 98.3 F (36.8 C) 98.3 F (36.8 C) 98.3 F (36.8 C)  TempSrc: Oral Oral Oral Oral  SpO2: 98% 92% 92% 98%  Weight:      Height:        Patient is lying comfortably in bed. Woke up patient for exam. NAD. Patient is oriented to name and month of birth. Patient denies pain but is a poor historian. Palpable pedal pulses. Motor function intact in LE bilaterally. Motor function intact.  Incision: Dressing C/D/I No cellulitis present Compartment soft.  Painless log rolling of the hip.   Lab Results  Component Value Date   WBC 11.9 (H) 09/18/2021   HGB 8.3 (L) 09/18/2021   HCT 25.2 (L) 09/18/2021   MCV 89.0 09/18/2021   PLT 159 09/18/2021   BMET    Component Value Date/Time   NA 136 09/18/2021 0518   K 3.9 09/18/2021 0518   CL 104 09/18/2021 0518   CO2 23 09/18/2021 0518   GLUCOSE 95 09/18/2021 0518   BUN 28 (H) 09/18/2021 0518   CREATININE 2.29 (H) 09/18/2021 0518   CALCIUM 8.1 (L) 09/18/2021 0518   GFRNONAA 19 (L) 09/18/2021 0518     Assessment/Plan: 2 Days Post-Op   Principal Problem:   Displaced fracture of left femoral neck (HCC) Active Problems:   Primary hypertension   Anemia due to stage 4 chronic kidney disease (HCC)   Chronic renal failure syndrome   Fall   Vascular dementia ([place])   Witnessed seizure-like activity ([place])   Osteoporosis   Left homonymous hemianopsia   WBAT with walker DVT ppx: Aspirin, SCDs, TEDS PO pain control: Scheduled tylenol. Oxycodone PRN pain. PT/OT: Patient unable to assess yesterday due to seizure activity from yesterday morning. PT to continue to follow.  Dispo: Patient  under hospitalist care and dispo per their recommendation. Aspirin 81mg  BID for DVT ppx x 6 weeks, low dose oxycodone 2.5 PRN pain.    Charlott Rakes, PA-C 09/18/2021, 7:42 AM  [facility]  [facility] [address], [place], [place] [address] Phone: [phone] [email or web address] Facebook  [employer]

## 2021-09-18 NOTE — Care Management Important Message (Signed)
Important Message  Patient Details  Name: Tracy [name] MRN: [id number] Date of Birth: 06-27-1919   Medicare Important Message Given:  Yes     Orbie Pyo 09/18/2021, 3:24 PM

## 2021-09-18 NOTE — Progress Notes (Addendum)
Neurology Progress Note LATIVIA [name] MR# [id number] 09/18/2021   S: no significant overnight events; no new complaints per patient, no family at the bedside   O: Current vital signs: BP 137/70 (BP Location: Left Arm)   Pulse 91   Temp 98.3 F (36.8 C) (Oral)   Resp 16   Ht 4\' 9"  (1.448 m)   Wt 41.8 kg   SpO2 98%   BMI 19.94 kg/m  Vital signs in last 24 hours: Temp:  [98.1 F (36.7 C)-98.5 F (36.9 C)] 98.3 F (36.8 C) (06/14 0358) Pulse Rate:  [91-97] 91 (06/14 0358) Resp:  [16-18] 16 (06/14 0358) BP: (121-138)/(50-70) 137/70 (06/14 0358) SpO2:  [92 %-100 %] 98 % (06/14 0358) GENERAL: Awake, alert in NAD HEENT: Normocephalic and atraumatic, moist mm LUNGS: symmetric excursions bilaterally with no audible wheezes ABDOMEN: Soft, nontender, nondistended  Ext: warm, well perfused NEURO:  Mental Status: AA&Ox3 3/3 registration, 0/3 recall.  Able to name 5 animals with 4 feet Language: speech is fluent without dysarthria or aphasia.  Intact and comprehension. PERR. EOMI, no facial asymmetry, facial sensation intact, hearing intact. No evidence of tongue atrophy or fibrillations, tongue/uvula/soft  Motor: 5/5 strength in all extremities. Tone: Tone and bulk is normal Sensation: Intact to light touch bilaterally Coordination: FTN intact bilaterally,  Gait - Deferred   Labs CBC    Component Value Date/Time   WBC 11.9 (H) 09/18/2021 0518   RBC 2.83 (L) 09/18/2021 0518   HGB 8.3 (L) 09/18/2021 0518   HCT 25.2 (L) 09/18/2021 0518   PLT 159 09/18/2021 0518   MCV 89.0 09/18/2021 0518   MCH 29.3 09/18/2021 0518   MCHC 32.9 09/18/2021 0518   RDW 13.1 09/18/2021 0518   LYMPHSABS 1.2 09/15/2021 1118   MONOABS 0.4 09/15/2021 1118   EOSABS 0.0 09/15/2021 1118   BASOSABS 0.0 09/15/2021 1118       Latest Ref Rng & Units 09/18/2021    5:18 AM 09/17/2021    1:08 AM 09/16/2021    4:30 AM  BMP  Glucose 70 - 99 mg/dL 95  123  123   BUN 8 - 23 mg/dL 28  26  24    Creatinine  0.44 - 1.00 mg/dL 2.29  2.18  1.92   Sodium 135 - 145 mmol/L 136  136  141   Potassium 3.5 - 5.1 mmol/L 3.9  3.7  3.9   Chloride 98 - 111 mmol/L 104  104  107   CO2 22 - 32 mmol/L 23  22  27    Calcium 8.9 - 10.3 mg/dL 8.1  8.3  8.7      Imaging I have reviewed images in epic and the results pertinent to this consultation are: MRI Brain showed Few scattered small infarcts in right parietal and occipital regions, likely embolic  Assessment: Tracy [name] is a 88 y.o. female PMHx right PCA stroke, CKD4, HTN, HLD, gout and recent left hemiarthroplasty for femoral neck fracture who presented with 3 minutes of witnessed seizure activity.  Patient denies further seizure activity, but memory is impaired and no family members are present for collateral.   EEG shows no cortical dysfunction arising from right frontotemporal region but no seizures or epileptiform discharges.  MRI brain reveals several new small embolic appearing infarcts in the right parietal and occipital lobes, likely embolic.  Patient will need acute stroke workup as well as 30 day cardiac monitor or loop recorder.  Recommendations: Continue valproic acid 250 mg PO TID Valproic acid level  today Inpatient seizure precautions Seizure precautions:Per [facility] statutes, patients with seizures are not allowed to drive until they have been seizure-free for six months and cleared by a physician. Use caution when using heavy equipment or power tools. Avoid working on ladders or at heights. Take showers instead of baths. Ensure the water temperature is not too high on the home water heater. Do not go swimming alone. Do not lock yourself in a room alone (i.e. bathroom). When caring for infants or small children, sit down when holding, feeding, or changing them to minimize risk of injury to the child in the event you have a seizure. Maintain good sleep hygiene. Avoid alcohol.  Begin stroke workup with MRA, carotid doppler, 2D  echocardiogram, lipid panel and A1C Consider 30 day cardiac monitor vs. Loop recorder  [place] , MSN, AGACNP-BC [facility] See Amion for schedule and pager information 09/18/2021 9:27 AM    If 7pm- 7am, please page neurology on call as listed in [place].

## 2021-09-19 ENCOUNTER — Other Ambulatory Visit (HOSPITAL_COMMUNITY): Payer: Self-pay

## 2021-09-19 ENCOUNTER — Inpatient Hospital Stay (HOSPITAL_COMMUNITY): Payer: Medicare PPO

## 2021-09-19 DIAGNOSIS — D631 Anemia in chronic kidney disease: Secondary | ICD-10-CM

## 2021-09-19 DIAGNOSIS — I639 Cerebral infarction, unspecified: Secondary | ICD-10-CM

## 2021-09-19 DIAGNOSIS — N189 Chronic kidney disease, unspecified: Secondary | ICD-10-CM

## 2021-09-19 DIAGNOSIS — N184 Chronic kidney disease, stage 4 (severe): Secondary | ICD-10-CM

## 2021-09-19 DIAGNOSIS — I82412 Acute embolism and thrombosis of left femoral vein: Secondary | ICD-10-CM | POA: Diagnosis not present

## 2021-09-19 DIAGNOSIS — F01B0 Vascular dementia, moderate, without behavioral disturbance, psychotic disturbance, mood disturbance, and anxiety: Secondary | ICD-10-CM

## 2021-09-19 DIAGNOSIS — S72002A Fracture of unspecified part of neck of left femur, initial encounter for closed fracture: Secondary | ICD-10-CM

## 2021-09-19 LAB — BASIC METABOLIC PANEL
Anion gap: 11 (ref 5–15)
BUN: 32 mg/dL — ABNORMAL HIGH (ref 8–23)
CO2: 22 mmol/L (ref 22–32)
Calcium: 8.2 mg/dL — ABNORMAL LOW (ref 8.9–10.3)
Chloride: 103 mmol/L (ref 98–111)
Creatinine, Ser: 2.26 mg/dL — ABNORMAL HIGH (ref 0.44–1.00)
GFR, Estimated: 19 mL/min — ABNORMAL LOW (ref >60)
Glucose, Bld: 110 mg/dL — ABNORMAL HIGH (ref 70–99)
Potassium: 4.1 mmol/L (ref 3.5–5.1)
Sodium: 136 mmol/L (ref 135–145)

## 2021-09-19 LAB — CBC
HCT: 26 % — ABNORMAL LOW (ref 36.0–46.0)
Hemoglobin: 8.9 g/dL — ABNORMAL LOW (ref 12.0–15.0)
MCH: 29.7 pg (ref 26.0–34.0)
MCHC: 34.2 g/dL (ref 30.0–36.0)
MCV: 86.7 fL (ref 80.0–100.0)
Platelets: 181 10*3/uL (ref 150–400)
RBC: 3 MIL/uL — ABNORMAL LOW (ref 3.87–5.11)
RDW: 13.1 % (ref 11.5–15.5)
WBC: 11.3 10*3/uL — ABNORMAL HIGH (ref 4.0–10.5)
nRBC: 0 % (ref 0.0–0.2)

## 2021-09-19 LAB — HEMOGLOBIN A1C
Hgb A1c MFr Bld: 5.4 % (ref 4.8–5.6)
Mean Plasma Glucose: 108.28 mg/dL

## 2021-09-19 LAB — LIPID PANEL
Cholesterol: 127 mg/dL (ref 0–200)
HDL: 72 mg/dL (ref >40)
LDL Cholesterol: 43 mg/dL (ref 0–99)
Total CHOL/HDL Ratio: 1.8 RATIO
Triglycerides: 62 mg/dL (ref <150)
VLDL: 12 mg/dL (ref 0–40)

## 2021-09-19 MED ORDER — LEVETIRACETAM 250 MG PO TABS
250.0000 mg | ORAL_TABLET | Freq: Every day | ORAL | Status: DC
  Administered 2021-09-19 – 2021-09-24 (×6): 250 mg via ORAL
  Filled 2021-09-19 (×6): qty 1

## 2021-09-19 MED ORDER — ASPIRIN 81 MG PO TBEC: 81.0000 mg | DELAYED_RELEASE_TABLET | Freq: Two times a day (BID) | ORAL | Status: DC

## 2021-09-19 MED ORDER — APIXABAN 2.5 MG PO TABS: 2.5000 mg | ORAL_TABLET | Freq: Two times a day (BID) | ORAL | Status: DC

## 2021-09-19 MED ORDER — APIXABAN 5 MG PO TABS
5.0000 mg | ORAL_TABLET | Freq: Once | ORAL | Status: AC
  Administered 2021-09-19: 5 mg via ORAL
  Filled 2021-09-19: qty 1

## 2021-09-19 MED ORDER — CLOPIDOGREL BISULFATE 75 MG PO TABS: 75.0000 mg | ORAL_TABLET | Freq: Every day | ORAL | Status: DC

## 2021-09-19 MED ORDER — VALPROIC ACID 250 MG/5ML PO SOLN
250.0000 mg | Freq: Three times a day (TID) | ORAL | Status: DC
  Administered 2021-09-19: 250 mg via ORAL
  Filled 2021-09-19 (×2): qty 5

## 2021-09-19 MED ORDER — ASPIRIN 81 MG PO TBEC: 81.0000 mg | DELAYED_RELEASE_TABLET | Freq: Every day | ORAL | Status: DC

## 2021-09-19 MED ORDER — APIXABAN 5 MG PO TABS
5.0000 mg | ORAL_TABLET | Freq: Two times a day (BID) | ORAL | Status: DC
  Administered 2021-09-19 – 2021-09-24 (×10): 5 mg via ORAL
  Filled 2021-09-19 (×10): qty 1

## 2021-09-19 NOTE — TOC Initial Note (Signed)
Transition of Care [facility]) - Initial/Assessment Note    Patient Details  Name: Tracy [name] MRN: [id number] Date of Birth: 14-Aug-1919  [facility] CM/SW Contact:    Geralynn Ochs, LCSW Phone Number: 09/19/2021, 3:33 PM  Clinical Narrative:           CSW spoke with patient's niece, Jackelyn Poling, on the phone, and met with nephew at bedside to discuss recommendation for SNF. Both are in agreement, requesting a SNF in [place] area. CSW faxed out referral, will follow with bed offers.        Expected Discharge Plan: Skilled Nursing Facility Barriers to Discharge: Continued Medical Work up, Ship broker   Patient Goals and CMS Choice Patient states their goals for this hospitalization and ongoing recovery are:: patient unable to participate in goal setting, not oriented CMS Medicare.gov Compare Post Acute Care list provided to:: Patient Represenative (must comment) Choice offered to / list presented to : [place] / Guardian  Expected Discharge Plan and Services Expected Discharge Plan: [place] Acute Care Choice: [place] Living arrangements for the past 2 months: Single Family Home                                      Prior Living Arrangements/Services Living arrangements for the past 2 months: Single Family Home Lives with:: Self Patient language and need for interpreter reviewed:: No Do you feel safe going back to the place where you live?: Yes      Need for Family Participation in Patient Care: Yes (Comment) Care giver support system in place?: No (comment) Current home services: [facility] Criminal Activity/Legal Involvement Pertinent to Current Situation/Hospitalization: No - Comment as needed  Activities of Daily Living Home Assistive Devices/Equipment: Walker (specify type) ADL Screening (condition at time of admission) Patient's cognitive ability adequate to safely complete daily activities?:  Yes Is the patient deaf or have difficulty hearing?: No Does the patient have difficulty seeing, even when wearing glasses/contacts?: No Does the patient have difficulty concentrating, remembering, or making decisions?: No Patient able to express need for assistance with ADLs?: Yes Does the patient have difficulty dressing or bathing?: Yes Independently performs ADLs?: No Communication: Independent Dressing (OT): Needs assistance Is this a change from baseline?: Change from baseline, expected to last >3 days Grooming: Needs assistance Is this a change from baseline?: Change from baseline, expected to last >3 days Feeding: Independent Bathing: Needs assistance Is this a change from baseline?: Change from baseline, expected to last >3 days Toileting: Needs assistance Is this a change from baseline?: Change from baseline, expected to last >3days In/Out Bed: Needs assistance Is this a change from baseline?: Change from baseline, expected to last >3 days Walks in Home: Needs assistance Is this a change from baseline?: Change from baseline, expected to last >3 days Does the patient have difficulty walking or climbing stairs?: Yes Weakness of Legs: Left Weakness of Arms/Hands: None  Permission Sought/Granted Permission sought to share information with : Facility Sport and exercise psychologist, Family Supports Permission granted to share information with : Yes, Verbal Permission Granted  Share Information with NAME: Ferne Coe  Permission granted to share info w AGENCY: SNF  Permission granted to share info w Relationship: Niece, Nephew     Emotional Assessment   Attitude/Demeanor/Rapport: Unable to Assess Affect (typically observed): Unable to Assess Orientation: : Oriented to Self Alcohol /  Substance Use: Not Applicable Psych Involvement: No (comment)  Admission diagnosis:  Femur fracture ([place]) [S72.90XA] Fall, initial encounter [W19.XXXA] Closed fracture of neck of left femur,  initial encounter ([place]) [S72.002A] Patient Active Problem List   Diagnosis Date Noted   Acute deep vein thrombosis (DVT) of left femoral vein ([place]) 48/25/0037   Acute embolic stroke ([place]) 04/88/8916   Witnessed seizure-like activity ([place]) 09/17/2021   Osteoporosis 09/17/2021   Left homonymous hemianopsia 09/17/2021   Vascular dementia ([place]) 09/16/2021   Displaced fracture of left femoral neck ([place]) 09/15/2021   Fall 09/15/2021   Primary hypertension 01/30/2021   Anemia due to stage 4 chronic kidney disease ([place]) 01/30/2021   Chronic renal failure syndrome 01/30/2021   Hyperlipidemia, unspecified 01/30/2021   PCP:  Charlane Ferretti, MD Pharmacy:   [id number] [place] [id number]: [phone]: [phone]    Social Determinants of Health (SDOH) Interventions    Readmission Risk Interventions     No data to display

## 2021-09-19 NOTE — Progress Notes (Signed)
Bilateral LE venous duplex study completed. Please see CV Proc for preliminary results.  Landra Howze BS, RVT 09/19/2021 11:45 AM

## 2021-09-19 NOTE — Assessment & Plan Note (Addendum)
LE Doppler with evidence of L femoral vein DVT and sporadic flow. No contraindications to DOAC.  Concerns this a.m. about patient's hemoglobin decline to 7.7 from 8.9 yesterday.  DAPT discontinued - Follow-up p.m. CBC - Continue Eliquis 5 mg twice daily x3 months; will consider return to DAPT if concerns for bleeding with drop in hemoglobin.

## 2021-09-19 NOTE — Plan of Care (Signed)
  Problem: Education: Goal: Knowledge of General Education information will improve Description: Including pain rating scale, medication(s)/side effects and non-pharmacologic comfort measures Outcome: Progressing   Problem: Clinical Measurements: Goal: Respiratory complications will improve Outcome: Progressing Goal: Cardiovascular complication will be avoided Outcome: Progressing   Problem: Activity: Goal: Risk for activity intolerance will decrease Outcome: Progressing   Problem: Nutrition: Goal: Adequate nutrition will be maintained Outcome: Progressing   Problem: Pain Managment: Goal: General experience of comfort will improve Outcome: Progressing   Problem: Safety: Goal: Ability to remain free from injury will improve Outcome: Progressing

## 2021-09-19 NOTE — TOC Benefit Eligibility Note (Signed)
Patient Tracy [name], English as a foreign language completed.    The patient is currently admitted and upon discharge could be taking Eliquis 2.5 mg.  The current 30 day co-pay is, $40.00.   The patient is insured through [place], [place] Patient Advocate Specialist [place] Patient Advocate Team Direct Number: [phone]  Fax: [phone]

## 2021-09-19 NOTE — Progress Notes (Signed)
Daily Progress Note Intern Pager: [phone]  Patient name: Tracy [name] Medical record number: [id number] Date of birth: 10-28-1919 Age: 88 y.o. Gender: female  Primary Care Provider: Charlane Ferretti, MD Consultants: Orthopedic surgery, neurology Code Status: DNR  Pt Overview and Major Events to Date:  6/11: Admitted 6/12: To OR for total left hip arthroplasty 6/12: Witnessed seizure like activity; neurology consulted and EEG completed 6/13: Second witnessed seizure-like activity lasting 1.5 minutes 6/14: MRI brain with few scattered acute ischemic infarcts of right parieto-occipital region. 6/15: LE Doppler with acute DVT of left femoral vein  Assessment and Plan:  Tracy [name] is a 88 year old female who presented with a femoral neck fracture after fall from standing without loss of consciousness who is now POD 3 total left arthroplasty. Pertinent PMH/PSH includes multiple strokes complicated by vascular dementia, CKD stage IV, HTN, and HLD.   * Displaced fracture of left femoral neck ([place]) Patient presented with mechanical fall from standing as she turned around to walk without her walker.  X-ray of left femur in the ED with displaced fracture in the neck of the proximal left femur. Orthopedics consulted in the ED who completed total left arthroplasty; appreciate assistance in patient's care.  PT/OT recommending SNF placement; family in agreement.  - Pain control with Tylenol 650 mg every 6 hours and Roxicodone 2.5 mg every 6 hours as needed breakthrough pain -Fall precautions -Delirium precautions  Primary hypertension 24-hour BP 127-137/59-67, most recent 127/65. Home losartan discontinued in light of renal function. - Continue amlodipine 10 mg daily  Chronic renal failure syndrome Avoid nephrotoxic agents.  Switched home losartan to amlodipine as aforementioned.  Creatinine today 2.26; baseline appears 1.9-2.2. - Continue maintenance IVF at 50 mL/h; will discontinue  when patient increases p.o. intake.  Acute deep vein thrombosis (DVT) of left femoral vein (HCC) LE Doppler with evidence of L femoral vein DVT and sporadic flow. No contraindications to DOAC. -Discontinue DAPT - Start Eliquis 5 mg twice daily x3 months  Acute embolic stroke [facility]) MRI brain revealing few scattered subcentimeter acute ischemic infarcts of the right parieto-occipital region without hemorrhage or mass effect.  May be a sequela of left femoral vein DVT versus cardiac arrhythmia versus PFO.  Possible source of seizure-like activity; neurology following. - Stroke team to complete transcranial Doppler with bubble study today to rule out PFO - EEG with sinus rhythm on admission; and no abnormal heart sounds auscultated TTE with LVEF 55 to 60%, no wall motion abnormalities, trivial mitral and mild aortic valve regurgitation.  Will consider cardiac monitoring upon discharge.  Osteoporosis Patient with L hip fracture after fall from standing without significant force. -Continue vitamin D3 1000 units daily and calcium carbonate 500 mg daily  Witnessed seizure-like activity (Tracy [name]) Overnight 6/12, patient with witnessed 3-minute seizure-like activity episode, and 1 to 2-hour postictal state.  Neurology consulted and made the following recommendations: Depakote 250 mg p.o. 3 times daily (VPA 43); magnesium, AST, ALT, and ammonia all largely WNL; inpatient seizure precautions.  Appreciate neurology's assistance in this patient's care. 6/13, patient with a second witnessed seizure-like episode lasting 1.5 minutes.  See progress note from this writer on 6/13 for detailed event.  Afterwards, EEG obtained which showed the following: Suggestive of cortical dysfunction from the R frontotemporal region and post-ictal state. No seizures or epileptiform discharges seen.   Vascular dementia [facility]) On assessment 6/12 AM, patient oriented to self (name but not age-stated 89), place, and year (initially  stated 1991,  but caught herself and stated 25 without prompting).  On admission, patient was oriented to situation.  Per family, this appears to be her baseline.  Patient continues to maintain her baseline.  Fall Presented with a fall from standing after tripping while turning without the assistance of her walker.  Hypoxia-resolved as of 09/17/2021 Patient was not hypoxic on admission.  Took 1 dose of fentanyl in ED, and desat to 79% approximately 20 minutes after medication.  Restored sats with 2 LNC.  Suspect due to fentanyl dose, but in light of patient's age and long bone fracture, considered VQ scan. - VQ scan not obtained, as symptoms resolved by the time schedule permitted scan to be completed.   Chronic conditions: Gout-allopurinol 200 mg daily History of CVA HLD-Daily aspirin discontinued in light of starting Eliquis; continue Lipitor 20 mg Dementia-memantine 10 mg GERD-Protonix 40 mg    FEN/GI: Regular PPx: Eliquis Dispo:SNF  pending bed offer and insurance authorization . Barriers include bed offer.   Subjective:  This morning, patient remains at baseline mentation, denies acute concerns or complaints today.  She reports no pain.  Niece, [name], called with update, and all questions answered.  [name] is in agreement that SNF is the best option for patient moving forward.  Objective: Temp:  [97.5 F (36.4 C)-98.1 F (36.7 C)] 98.1 F (36.7 C) (06/15 0743) Pulse Rate:  [66-97] 85 (06/15 0743) Resp:  [15-19] 18 (06/15 0743) BP: (127-137)/(59-67) 127/65 (06/15 0743) SpO2:  [97 %-100 %] 100 % (06/15 0743) Physical Exam: General: Alert, cooperative, pleasant elderly female patient in NAD Cardiovascular: RRR Respiratory: Normal WOB on room air Abdomen: Soft, NT/ND Extremities: Nonedematous BLE  Laboratory: Most recent CBC Lab Results  Component Value Date   WBC 11.3 (H) 09/19/2021   HGB 8.9 (L) 09/19/2021   HCT 26.0 (L) 09/19/2021   MCV 86.7 09/19/2021   PLT  181 09/19/2021   Most recent BMP    Latest Ref Rng & Units 09/19/2021    6:33 AM  BMP  Glucose 70 - 99 mg/dL 110   BUN 8 - 23 mg/dL 32   Creatinine 0.44 - 1.00 mg/dL 2.26   Sodium 135 - 145 mmol/L 136   Potassium 3.5 - 5.1 mmol/L 4.1   Chloride 98 - 111 mmol/L 103   CO2 22 - 32 mmol/L 22   Calcium 8.9 - 10.3 mg/dL 8.2      Imaging/Diagnostic Tests: MRI brain IMPRESSION: 1. Few scattered subcentimeter acute ischemic infarcts involving the right parieto-occipital region as above, likely embolic in nature. No associated hemorrhage or mass effect. 2. Chronic right MCA and PCA distribution infarcts. 3. Underlying chronic microvascular ischemic disease with a few additional scattered remote lacunar infarcts about the deep gray nuclei and left cerebellum.     Electronically Signed   By: Jeannine Boga M.D.   On: 09/18/2021 02:43  MR angio head IMPRESSION: 1. Negative intracranial MRA for large vessel occlusion or other emergent finding. No hemodynamically significant or correctable stenosis. 2. 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     Electronically Signed   By: Jeannine Boga M.D.   On: 09/18/2021 20:50   Rosezetta Schlatter, MD 09/19/2021, 1:57 PM  PGY-1, [place] Intern pager: [phone], text pages welcome Secure chat group [place]

## 2021-09-19 NOTE — Progress Notes (Signed)
  Progress Note   Date: 09/19/2021  Patient Name: Tracy [name]        MRN#: [id number]   Clarification of the pathological fracture(s): Osteoporotic pathological fracture of left femur

## 2021-09-19 NOTE — Progress Notes (Addendum)
STROKE TEAM PROGRESS NOTE   ATTENDING NOTE: I reviewed above note and agree with the assessment and plan. Pt was seen and examined.   88 year old female with history of hypertension, hyperlipidemia, CKD 4, and dementia had a fall on 6/10 with left femoral neck fracture status post left hemiarthroplasty.  Had episode of confusion and witnessed seizure for 3 minutes.  EEG no seizure, MRI showed right PCA/MCA and MCA/ACA several punctate infarcts.  MRA negative but 5 mm left P-comm aneurysm stable from prior.  EF 55 to 60%, carotid Doppler negative.  LDL 43, A1c 5.4.  LE venous Doppler showed left femoral vein, popliteal vein, posterior tibial vein DVT.  Creatinine 1.94-2.29-2.26.  WBC 15.1-13.4-11.9-11.3.  Hemoglobin 11.7-10.0-8.3-8.9.  History of stroke in 01/2021 with left vision decrease, generalized weakness and fall.  CT and MRI showed right PCA acute infarct, right frontal chronic infarct.  MRA head and neck unremarkable.  EF 65 to 70%.  LDL 79, A1c 6.0.  She was discharged on DAPT for 3 weeks and Lipitor.  Recommend 30-day CardioNet monitoring to rule out A-fib.  On exam today, patient nephew at bedside.  Patient sitting in bed for lunch, stated that she feels good and at her baseline.  Patient awake alert, orientated to people, place, told me age was 85s.  No aphasia, able to name and repeat, follows all simple commands.  No dysarthria.  Neurological exam otherwise intact grossly.  Etiology for patient's seizure likely due to stroke.  Etiology for stroke could be due to DVT in the setting of PFO versus cardioembolic source like occult A-fib.  Given current extensive left lower extremity DVT, will check TCD bubble study to evaluate PFO.  Agree with DOAC anticoagulation as stroke signs are small, will DC aspirin and Plavix.  Okay to continue Depakote for seizure control.  Continue Lipitor 20.  PT therapy recommend SNF.  We will follow.  Discussed with primary team.  For detailed assessment and plan,  please refer to below as I have made changes wherever appropriate.   Tracy Hawking, MD PhD Stroke Neurology 09/19/2021 9:14 PM    SUBJECTIVE (INTERVAL HISTORY) [name] with PMHX of left PCA stroke in 2022,HTN, CKD IV, HLD and now s/p left hemiarthroplasty on Monday 2/2 displaced left femoral neck fracture. She had a mechanical fall and denied any LOC or head trauma. Pt was found confused and then reported having a witnessed seizure  that lasted for 3 minutes. She was densely postictal and unresponsive to noxious stimulation and then improved with some confusion. Pt was seen by neurology team for first time seizure activity  loaded with valproic acid 850 mg IV (load) and EEG. Her EEG showed no seizures or epileptiform discharges. Patient also had a stroke workup which included MRI BRAIN and revealed few scattered subcentimeter acute ischemic infarcts involving the right parieto-occipital region which is likely embolic in nature. Chronic RMCA and PCA distribution infarcts. MRA Head was negative  for large vessel occlusion or other findings. 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 She has just completed lower extremity doppler No headaches, visual changes, speech difficulty, paresthesia or weakness   OBJECTIVE Vitals:   09/19/21 0014 09/19/21 0352 09/19/21 0743 09/19/21 1500  BP: 131/67 (!) 130/59 127/65 131/72  Pulse: 93 95 85 93  Resp: 15 19 18 18   Temp: 98 F (36.7 C) 97.8 F (36.6  C) 98.1 F (36.7 C) 98 F (36.7 C)  TempSrc: Oral Oral Oral   SpO2: 99% 100% 100% 97%  Weight:      Height:        CBC:  Recent Labs  Lab 09/15/21 1118 09/16/21 0430 09/18/21 0518 09/19/21 0633  WBC 10.9*   < > 11.9* 11.3*  NEUTROABS 9.1*  --   --   --   HGB 11.6*   < > 8.3* 8.9*  HCT 35.3*   < > 25.2* 26.0*  MCV 89.4   < > 89.0 86.7  PLT 255   < > 159 181   < > = values  in this interval not displayed.    Basic Metabolic Panel:  Recent Labs  Lab 09/17/21 0558 09/18/21 0518 09/19/21 0633  NA  --  136 136  K  --  3.9 4.1  CL  --  104 103  CO2  --  23 22  GLUCOSE  --  95 110*  BUN  --  28* 32*  CREATININE  --  2.29* 2.26*  CALCIUM  --  8.1* 8.2*  MG 2.0  --   --     Lipid Panel:  Recent Labs  Lab 09/19/21 0633  CHOL 127  TRIG 62  HDL 72  CHOLHDL 1.8  VLDL 12  LDLCALC 43   HgbA1c:  Lab Results  Component Value Date   HGBA1C 5.4 09/19/2021   Urine Drug Screen: No results found for: "LABOPIA", "COCAINSCRNUR", "LABBENZ", "AMPHETMU", "THCU", "LABBARB"  Alcohol Level No results found for: "ETH"  IMAGING  Results for orders placed or performed during the hospital encounter of 09/15/21  MR BRAIN WO CONTRAST   Narrative   CLINICAL DATA:  Initial evaluation for neuro deficit, stroke suspected.  EXAM: MRI HEAD WITHOUT CONTRAST  TECHNIQUE: Multiplanar, multiecho pulse sequences of the brain and surrounding structures were obtained without intravenous contrast.  COMPARISON:  Prior MRI from 01/30/2021.  FINDINGS: Brain: Cerebral volume within normal limits for age. Patchy and confluent T2/FLAIR hyperintensity involving the periventricular and deep white matter both cerebral hemispheres, most consistent with chronic small vessel ischemic disease. Few scattered associated remote lacunar infarcts present about the deep gray nuclei and left cerebellum. Remote cortical subcortical right frontal lobe infarct noted. Chronic right PCA distribution infarct with associated laminar necrosis.  Few scattered subcentimeter foci of restricted diffusion seen involving the cortical and subcortical right parieto-occipital region (series 5, image 82, 84, 83, 76, 73), consistent with small acute ischemic infarcts. Largest of these foci measures approximately 6 mm. No associated hemorrhage or mass effect. These are likely embolic in nature.  No  other evidence for acute or subacute ischemia. No acute intracranial hemorrhage. No mass lesion, midline shift or mass effect. No hydrocephalus or extra-axial fluid collection. Pituitary gland suprasellar region within normal limits.  Vascular: Major intracranial vascular flow voids are maintained.  Skull and upper cervical spine: Craniocervical junction within normal limits. Bone marrow signal intensity normal. No scalp soft tissue abnormality.  Sinuses/Orbits: Prior bilateral ocular lens replacement. Scattered mucosal thickening noted about the ethmoidal air cells and maxillary sinuses. No mastoid effusion. Inner ear structures grossly normal.  Other: None.  IMPRESSION: 1. Few scattered subcentimeter acute ischemic infarcts involving the right parieto-occipital region as above, likely embolic in nature. No associated hemorrhage or mass effect. 2. Chronic right MCA and PCA distribution infarcts. 3. Underlying chronic microvascular ischemic disease with a few additional scattered remote lacunar infarcts about the deep gray nuclei and left  cerebellum.   Electronically Signed   By: Jeannine Boga M.D.   On: 09/18/2021 02:43   MR ANGIO HEAD WO CONTRAST   Narrative   CLINICAL DATA:  Follow-up examination for acute stroke.  EXAM: MRA HEAD WITHOUT CONTRAST  TECHNIQUE: Angiographic images of the Circle of Willis were acquired using MRA technique without intravenous contrast.  COMPARISON:  Prior MRI from earlier the same day as well as previous MRA from 01/30/2021.  FINDINGS: Anterior circulation: Both internal carotid arteries patent to the termini without flow-limiting stenosis. Approximate 5 mm aneurysm extending posteriorly and inferiorly from the supraclinoid left ICA, relatively stable from prior when measured in a similar fashion. This aneurysm appears somewhat irregular and possibly bilobed. A1 segments patent bilaterally. Normal anterior communicating  artery complex. Anterior cerebral arteries patent without stenosis. No M1 stenosis or occlusion. No proximal MCA branch occlusion. Distal MCA branches perfused and symmetric.  Posterior circulation: Both vertebral arteries patent without stenosis. Left vertebral artery dominant. Right PICA patent. Left PICA origin not well seen. Basilar patent to its distal aspect without stenosis. Superior cerebellar arteries patent bilaterally. Both PCAs widely patent and well perfused or distal aspects.  Anatomic variants: As above.  Other: None.  IMPRESSION: 1. Negative intracranial MRA for large vessel occlusion or other emergent finding. No hemodynamically significant or correctable stenosis. 2. 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   Electronically Signed   By: Jeannine Boga M.D.   On: 09/18/2021 20:50   Results for orders placed or performed during the hospital encounter of 01/29/21  CT Head Wo Contrast   Narrative   CLINICAL DATA:  Head trauma.  EXAM: CT HEAD WITHOUT CONTRAST  TECHNIQUE: Contiguous axial images were obtained from the base of the skull through the vertex without intravenous contrast.  COMPARISON:  CT head report only from 03/10/2001.  FINDINGS: Brain: There is hypodensity with loss of gray-white matter distinction in the right occipital region. Gray-white matter distinction is otherwise preserved. No evidence of hemorrhage, hydrocephalus, extra-axial collection or mass lesion/mass effect. There is mild diffuse atrophy. There is a small old infarct in the right frontal lobe white matter. Prominent bilateral basal ganglia calcifications are seen. There is mild periventricular white matter hypodensity, likely chronic small vessel ischemic change.  Vascular: No hyperdense vessel or unexpected calcification.  Skull: Normal. Negative for fracture or focal  lesion.  Sinuses/Orbits: No acute finding.  Other: None.  IMPRESSION: 1. 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 No hemorrhage or midline shift. 2. Mild diffuse atrophy and mild chronic small vessel ischemic change. 3. These results were called by telephone at the time of interpretation on 01/29/2021 at 11:05 pm to provider Dr. Pearline Cables, who verbally acknowledged these results.   Electronically Signed   By: [name] M.D.   On: 01/29/2021 23:06    NEURO EXAM: Mental Status: AA&Ox3 3/3   Able to name 5 animals, correct year, POTUS and year stated 1921. Language: speech is fluent without dysarthria or aphasia.  Intact and comprehension. PERR. EOMI, no facial asymmetry, facial sensation intact, hearing intact. No evidence of tongue atrophy or fibrillations, tongue/uvula/soft  Motor: 5/5 strength in all extremities. Tone: Tone and bulk is normal Sensation: Intact to light touch bilaterally Coordination: FTN intact bilaterally,  Gait - Deferred   ASSESSMENT/PLAN [name] is a 88 y.o. female with history of  left PCA stroke in 2022, CKD4, HTN, gout and HLD who is s/p  left hemiarthroplasty. Post operatively she was noted to be acutely confused, and  witnessed seizure activity lasting for about 3 minutes. She was initially densely postictal being unresponsive to noxious stimuli, which improved within several minutes to an awake state with responsiveness to voice, but still with confusion. She was evaluated for fist time seizure activity with negative EEG. MRI brain ordered for possible stroke and revealed embolic type stroke Stroke:   right parieto-occipital region  infarct which is  embolic secondary to cardiac sources Neuro check q 4 hours 2D Echo completed and reveals LVEF @ 55-60 %, No left or right atrial dilitation PVL Doppler lower Extremity bilateral reveals deep vein thrombosis involving the left femoral ,popliteal,and left posterior tibial  veins Start Eliquis 5 mg po q 12 hours ( this was discussed with Dr Erlinda Hong secondary to her present stroke) TCD Bubble study to r/o PFO (Ordered) LDL current @ 43 HgbA1c current @ 5.4% PT/OT consult Diet Order             Diet Heart Room service appropriate? Yes; Fluid consistency: Thin  Diet effective now                  aspirin 81 mg daily prior to admission, now on Eliquis (apixaban) daily.  Patient counseled to be compliant with her antithrombotic medications Ongoing aggressive stroke risk factor management Disposition:  pending  Hypertension Stable  Long-term BP goal normotensive  Hyperlipidemia Home meds: Lipitor 20 mg qhs resumed in hospital LDL 43, goal < 70 Continue statin at discharge    Other Stroke Risk Factors Advanced age HTN HDL  Hx stroke left PCA stroke 2022    Other Active Problems Left femoral head fracture   Hospital day # 4    To contact Stroke Continuity provider, please refer to [email or web address] After hours, contact General Neurology

## 2021-09-19 NOTE — Progress Notes (Signed)
FPTS Brief Progress Note  S: sleeping   O: BP 131/67 (BP Location: Left Arm)   Pulse 93   Temp 98 F (36.7 C) (Oral)   Resp 15   Ht 4\' 9"  (1.448 m)   Wt 41.8 kg   SpO2 99%   BMI 19.94 kg/m     A/P: - Orders reviewed. Labs for AM ordered, which was adjusted as needed.   Gladys Damme, MD 09/19/2021, 3:34 AM PGY-3, [facility] Night Resident  Please page [phone] with questions.

## 2021-09-19 NOTE — NC FL2 (Signed)
[place] LEVEL OF CARE SCREENING TOOL     IDENTIFICATION  Patient Name: Tracy [name] Birthdate: [birth year] Sex: female Admission Date (Current Location): 09/15/2021  [place] and [place] Number:  Herbalist and Address:  [facility] [place], [place] [address], [place], [place] [address]      Provider Number: [id number]  Attending Physician Name and Address:  McDiarmid, Blane Ohara, MD  Relative Name and Phone Number:       Current Level of Care: Hospital Recommended Level of Care: [place] Prior Approval Number:    Date Approved/Denied:   PASRR Number: [id number]  Discharge Plan: SNF    Current Diagnoses: Patient Active Problem List   Diagnosis Date Noted   Acute deep vein thrombosis (DVT) of left femoral vein ([place]) 30/10/6224   Acute embolic stroke ([place]) 33/35/4562   Witnessed seizure-like activity ([place]) 09/17/2021   Osteoporosis 09/17/2021   Left homonymous hemianopsia 09/17/2021   Vascular dementia ([place]) 09/16/2021   Displaced fracture of left femoral neck ([place]) 09/15/2021   Fall 09/15/2021   Primary hypertension 01/30/2021   Anemia due to stage 4 chronic kidney disease ([place]) 01/30/2021   Chronic renal failure syndrome 01/30/2021   Hyperlipidemia, unspecified 01/30/2021    Orientation RESPIRATION BLADDER Height & Weight     Self  Normal Incontinent Weight: 92 lb 2.4 oz (41.8 kg) Height:  4\' 9"  (144.8 cm)  BEHAVIORAL SYMPTOMS/MOOD NEUROLOGICAL BOWEL NUTRITION STATUS      Continent Diet (heart healthy)  AMBULATORY STATUS COMMUNICATION OF NEEDS Skin   Extensive Assist Verbally Surgical wounds (closed left hip, hydrocolloid dressing)                       Personal Care Assistance Level of Assistance  Bathing, Feeding, Dressing Bathing Assistance: Limited assistance Feeding assistance: Limited assistance Dressing Assistance: Limited assistance     Functional Limitations Info  Sight Sight Info:  Impaired        SPECIAL CARE FACTORS FREQUENCY  PT (By licensed PT), OT (By licensed OT)     PT Frequency: 5x/wk OT Frequency: 5x/wk            Contractures Contractures Info: Not present    Additional Factors Info  Code Status, Allergies Code Status Info: DNR Allergies Info: NKA           Current Medications (09/19/2021):  This is the current hospital active medication list Current Facility-Administered Medications  Medication Dose Route Frequency Provider Last Rate Last Admin   0.9 %  sodium chloride infusion   Intravenous Continuous Rod Can, MD   Stopped at 09/18/21 1227   acetaminophen (TYLENOL) tablet 650 mg  650 mg Oral Q6H Swinteck, Aaron Edelman, MD   650 mg at 09/19/21 5638   allopurinol (ZYLOPRIM) tablet 200 mg  200 mg Oral Daily Swinteck, Aaron Edelman, MD   200 mg at 09/19/21 9373   amLODipine (NORVASC) tablet 10 mg  10 mg Oral Daily McDiarmid, Blane Ohara, MD   10 mg at 09/19/21 4287   apixaban (ELIQUIS) tablet 5 mg  5 mg Oral BID Rosezetta Schlatter, MD   5 mg at 09/19/21 1457   apixaban (ELIQUIS) tablet 5 mg  5 mg Oral Once Rosezetta Schlatter, MD       atorvastatin (LIPITOR) tablet 20 mg  20 mg Oral Daily Rod Can, MD   20 mg at 09/19/21 6811   bisacodyl (DULCOLAX) suppository 10 mg  10 mg Rectal Daily PRN McDiarmid,  Blane Ohara, MD       calcium carbonate (OS-CAL - dosed in mg of elemental calcium) tablet 500 mg of elemental calcium  1 tablet Oral Q breakfast Alcus Dad, MD   500 mg of elemental calcium at 09/19/21 9509   cholecalciferol (VITAMIN D3) tablet 1,000 Units  1,000 Units Oral Daily McDiarmid, Blane Ohara, MD   1,000 Units at 09/19/21 0923   feeding supplement (NEPRO CARB STEADY) liquid 237 mL  237 mL Oral TID BM Alcus Dad, MD   237 mL at 09/18/21 2215   levETIRAcetam (KEPPRA) tablet 250 mg  250 mg Oral Daily McDiarmid, Blane Ohara, MD   250 mg at 09/19/21 0924   LORazepam (ATIVAN) injection 1 mg  1 mg Intravenous Once PRN Wells Guiles, DO       memantine  [facility]) tablet 10 mg  10 mg Oral Daily Swinteck, Aaron Edelman, MD   10 mg at 09/19/21 3267   menthol-cetylpyridinium (CEPACOL) lozenge 3 mg  1 lozenge Oral PRN Swinteck, Aaron Edelman, MD       Or   phenol (CHLORASEPTIC) mouth spray 1 spray  1 spray Mouth/Throat PRN Swinteck, Aaron Edelman, MD       metoCLOPramide (REGLAN) tablet 5-10 mg  5-10 mg Oral Q8H PRN Swinteck, Aaron Edelman, MD       Or   metoCLOPramide (REGLAN) injection 5-10 mg  5-10 mg Intravenous Q8H PRN Swinteck, Aaron Edelman, MD       ondansetron [facility]) tablet 4 mg  4 mg Oral Q6H PRN Swinteck, Aaron Edelman, MD       Or   ondansetron [facility]) injection 4 mg  4 mg Intravenous Q6H PRN Swinteck, Aaron Edelman, MD       oxyCODONE (Oxy IR/ROXICODONE) immediate release tablet 2.5 mg  2.5 mg Oral Q6H PRN Rod Can, MD   2.5 mg at 09/18/21 1247   pantoprazole (PROTONIX) EC tablet 40 mg  40 mg Oral Daily Rod Can, MD   40 mg at 09/19/21 1245   senna-docusate (Senokot-S) tablet 1 tablet  1 tablet Oral BID McDiarmid, Blane Ohara, MD   1 tablet at 09/19/21 8099     Discharge Medications: Please see discharge summary for a list of discharge medications.  Relevant Imaging Results:  Relevant Lab Results:   Additional Information SS#: [id number]  Geralynn Ochs, LCSW

## 2021-09-20 ENCOUNTER — Inpatient Hospital Stay (HOSPITAL_COMMUNITY): Payer: Medicare PPO

## 2021-09-20 DIAGNOSIS — R569 Unspecified convulsions: Secondary | ICD-10-CM | POA: Diagnosis not present

## 2021-09-20 DIAGNOSIS — I82412 Acute embolism and thrombosis of left femoral vein: Secondary | ICD-10-CM | POA: Diagnosis not present

## 2021-09-20 DIAGNOSIS — S72002A Fracture of unspecified part of neck of left femur, initial encounter for closed fracture: Secondary | ICD-10-CM | POA: Diagnosis not present

## 2021-09-20 DIAGNOSIS — I639 Cerebral infarction, unspecified: Secondary | ICD-10-CM | POA: Diagnosis not present

## 2021-09-20 LAB — CBC
HCT: 22.7 % — ABNORMAL LOW (ref 36.0–46.0)
HCT: 28.2 % — ABNORMAL LOW (ref 36.0–46.0)
Hemoglobin: 7.7 g/dL — ABNORMAL LOW (ref 12.0–15.0)
Hemoglobin: 9.6 g/dL — ABNORMAL LOW (ref 12.0–15.0)
MCH: 29.5 pg (ref 26.0–34.0)
MCH: 29.5 pg (ref 26.0–34.0)
MCHC: 33.9 g/dL (ref 30.0–36.0)
MCHC: 34 g/dL (ref 30.0–36.0)
MCV: 87 fL (ref 80.0–100.0)
MCV: 86.8 fL (ref 80.0–100.0)
Platelets: 176 10*3/uL (ref 150–400)
Platelets: 247 10*3/uL (ref 150–400)
RBC: 2.61 MIL/uL — ABNORMAL LOW (ref 3.87–5.11)
RBC: 3.25 MIL/uL — ABNORMAL LOW (ref 3.87–5.11)
RDW: 12.8 % (ref 11.5–15.5)
RDW: 12.9 % (ref 11.5–15.5)
WBC: 9.1 10*3/uL (ref 4.0–10.5)
WBC: 10.8 10*3/uL — ABNORMAL HIGH (ref 4.0–10.5)
nRBC: 0 % (ref 0.0–0.2)
nRBC: 0 % (ref 0.0–0.2)

## 2021-09-20 LAB — BASIC METABOLIC PANEL
Anion gap: 5 (ref 5–15)
BUN: 35 mg/dL — ABNORMAL HIGH (ref 8–23)
CO2: 25 mmol/L (ref 22–32)
Calcium: 7.7 mg/dL — ABNORMAL LOW (ref 8.9–10.3)
Chloride: 106 mmol/L (ref 98–111)
Creatinine, Ser: 2.14 mg/dL — ABNORMAL HIGH (ref 0.44–1.00)
GFR, Estimated: 20 mL/min — ABNORMAL LOW (ref >60)
Glucose, Bld: 123 mg/dL — ABNORMAL HIGH (ref 70–99)
Potassium: 4.3 mmol/L (ref 3.5–5.1)
Sodium: 136 mmol/L (ref 135–145)

## 2021-09-20 MED ORDER — OXYCODONE HCL 5 MG PO TABS
2.5000 mg | ORAL_TABLET | Freq: Four times a day (QID) | ORAL | Status: DC | PRN
  Administered 2021-09-20: 2.5 mg via ORAL
  Filled 2021-09-20: qty 1

## 2021-09-20 MED ORDER — OXYCODONE HCL 5 MG PO TABS: 2.5000 mg | ORAL_TABLET | ORAL | 0 refills | Status: DC | PRN

## 2021-09-20 MED ORDER — APIXABAN 5 MG PO TABS: 5.0000 mg | ORAL_TABLET | Freq: Two times a day (BID) | ORAL | 1 refills | Status: AC

## 2021-09-20 NOTE — TOC Progression Note (Addendum)
Transition of Care [facility]) - Progression Note    Patient Details  Name: Tracy [name] MRN: [id number] Date of Birth: March 27, 1920  Transition [facility]) CM/SW [place], [place] Phone Number: 09/20/2021, 2:40 PM  Clinical Narrative:   CSW spoke with niece, Jackelyn Poling, to provide bed offers. Debbie to review and get back to CSW with choice. Debbie asked about [facility], but the referral is still pending review. CSW to try to reach [facility] about a decision on referral. CSW to follow.    Expected Discharge Plan: [place] Barriers to Discharge: Continued Medical Work up, Ship broker  Expected Discharge Plan and Services Expected Discharge Plan: [place] Choice: [place] arrangements for the past 2 months: Single Family Home                                       Social Determinants of Health (SDOH) Interventions    Readmission Risk Interventions     No data to display

## 2021-09-20 NOTE — Progress Notes (Signed)
Physical Therapy Treatment Patient Details Name: Tracy [name] MRN: [id number] DOB: January 15, 1920 Today's Date: 09/20/2021   History of Present Illness Pt is a 88 y/o F presenting to ED on 6/11 after fall at home. X ray of L femur revealing displaced fx of proximal femur. S/p L hip hemiarthroplasty anterior approach on 6/12, seizure like activity on 6/13. EEG revealing cortical dysfunction arising from R frontotemporal region and moderate diffuse encephalopathy. PMH includes L PCA CVA (2022), CKD 4, HTN, HLD, gout, and vascular dementia.  MRI 6/14 positive for Few scattered subcentimeter acute ischemic infarcts involving the  right parieto-occipital region.    PT Comments    Patient progressing with activity tolerance and able to take steps with walker to recliner.  Noted difficulty with L hand placement on walker needing cues and assist for proper placement.  Still with pain L hip, but improved from initial assessment.  Feel she will benefit from STSNF level rehab at d/c.     Recommendations for follow up therapy are one component of a multi-disciplinary discharge planning process, led by the attending physician.  Recommendations may be updated based on patient status, additional functional criteria and insurance authorization.  Follow Up Recommendations  Skilled nursing-short term rehab (<3 hours/day)     Assistance Recommended at Discharge Frequent or constant Supervision/Assistance  Patient can return home with the following Two people to help with walking and/or transfers;Assistance with cooking/housework;Direct supervision/assist for medications management;Assist for transportation;Help with stairs or ramp for entrance;A lot of help with bathing/dressing/bathroom   Equipment Recommendations  None recommended by PT    Recommendations for Other Services       Precautions / Restrictions Precautions Precautions: Fall Restrictions LLE Weight Bearing: Weight bearing as tolerated      Mobility  Bed Mobility Overal bed mobility: Needs Assistance Bed Mobility: Supine to Sit     Supine to sit: Mod assist     General bed mobility comments: increased time for bringing legs off bed and scooting hips with cues and lifting help for trunk    Transfers Overall transfer level: Needs assistance Equipment used: Rolling walker (2 wheels) Transfers: Sit to/from Stand, Bed to chair/wheelchair/BSC Sit to Stand: +2 physical assistance, Min assist   Step pivot transfers: Min assist, +2 physical assistance       General transfer comment: used RW to step to recliner with mild antalgia on L and cues for safety    Ambulation/Gait                   Stairs             Wheelchair Mobility    Modified Rankin (Stroke Patients Only)       Balance Overall balance assessment: Needs assistance Sitting-balance support: Feet supported Sitting balance-Leahy Scale: Fair Sitting balance - Comments: sitting EOB unaided   Standing balance support: Bilateral upper extremity supported, Reliant on assistive device for balance Standing balance-Leahy Scale: Poor Standing balance comment: reliant on external support                            Cognition Arousal/Alertness: Awake/alert Behavior During Therapy: Flat affect Overall Cognitive Status: History of cognitive impairments - at baseline                                          Exercises Total  Joint Exercises Ankle Circles/Pumps: AROM, Both, Supine, 10 reps Towel Squeeze: Strengthening, 5 reps, Supine, Both Short Arc Quad: AROM, Left, 10 reps, Supine Heel Slides: AAROM, AROM, Both, 5 reps, Supine    General Comments General comments (skin integrity, edema, etc.): incontinent of stool so removed pad, purewick and briefs and assisted for perineal hygiene in standing, RN aware need for new purewick      Pertinent Vitals/Pain Pain Assessment Pain Assessment: Faces Faces Pain  Scale: Hurts even more Pain Location: LLE with mobililty Pain Descriptors / Indicators: Discomfort, Grimacing Pain Intervention(s): Monitored during session, Limited activity within patient's tolerance, Repositioned    Home Living                          Prior Function            PT Goals (current goals can now be found in the care plan section) Progress towards PT goals: Progressing toward goals    Frequency    Min 3X/week      PT Plan Current plan remains appropriate    Co-evaluation              AM-PAC PT "6 Clicks" Mobility   Outcome Measure  Help needed turning from your back to your side while in a flat bed without using bedrails?: A Lot Help needed moving from lying on your back to sitting on the side of a flat bed without using bedrails?: A Lot Help needed moving to and from a bed to a chair (including a wheelchair)?: A Lot Help needed standing up from a chair using your arms (e.g., wheelchair or bedside chair)?: A Lot Help needed to walk in hospital room?: Total Help needed climbing 3-5 steps with a railing? : Total 6 Click Score: 10    End of Session Equipment Utilized During Treatment: Gait belt Activity Tolerance: Patient tolerated treatment well Patient left: in chair;with call bell/phone within reach;with chair alarm set   PT Visit Diagnosis: Other abnormalities of gait and mobility (R26.89);History of falling (Z91.81);Muscle weakness (generalized) (M62.81);Pain Pain - Right/Left: Left Pain - part of body: Hip     Time: 6962-9528 PT Time Calculation (min) (ACUTE ONLY): 25 min  Charges:  $Therapeutic Exercise: 8-22 mins $Therapeutic Activity: 8-22 mins                     Magda Kiel, PT Acute Rehabilitation Services UXLKG:[phone] Office:[phone] 09/20/2021    [name] 09/20/2021, 4:53 PM

## 2021-09-20 NOTE — Progress Notes (Signed)
Daily Progress Note Intern Pager: [phone]  Patient name: Tracy [name] Medical record number: [id number] Date of birth: 1919/09/24 Age: 88 y.o. Gender: female  Primary Care Provider: Charlane Ferretti, MD Consultants: Orthopedics, Neurology Code Status: DNR  Pt Overview and Major Events to Date:  6/11: Admitted 6/12: To OR for total left hip arthroplasty 6/12: Witnessed seizure like activity; neurology consulted and EEG completed 6/13: Second witnessed seizure-like activity lasting 1.5 minutes 6/14: MRI brain with few scattered acute ischemic infarcts of right parieto-occipital region. 6/15: LE Doppler with acute DVT of left femoral vein  Assessment and Plan:  Tracy [name] is a 88 year old female who presented with a femoral neck fracture after fall from standing.  POD 4 left total arthroplasty. Pertinent PMH/PSH includes multiple strokes complicated by vascular dementia, CKD stage IV, HTN, and HLD.   * Displaced fracture of left femoral neck ([place]) Patient presented with fall from standing as she turned around to walk without her walker.  X-ray of left femur in the ED with displaced fracture in the neck of the proximal left femur. Orthopedics consulted in the ED who completed left total arthroplasty; appreciate assistance in patient's care.  PT/OT recommending SNF placement; family in agreement.  - Pain control with Tylenol 650 mg every 6 hours and Roxicodone 2.5 mg every 6 hours as needed breakthrough pain -Fall precautions -Delirium precautions  Primary hypertension 24-hour BP 131-146/66-72, most recent 143/68. Home losartan discontinued in light of renal function. - Continue amlodipine 10 mg daily  Chronic renal failure syndrome Avoid nephrotoxic agents.  Switched home losartan to amlodipine as aforementioned.  Creatinine today 2.14; baseline appears 1.9-2.2. - Continue maintenance IVF at 50 mL/h; will discontinue when patient increases p.o. intake.  Acute deep vein  thrombosis (DVT) of left femoral vein (HCC) LE Doppler with evidence of L femoral vein DVT and sporadic flow. No contraindications to DOAC.  Concerns this a.m. about patient's hemoglobin decline to 7.7 from 8.9 yesterday.  DAPT discontinued - Follow-up p.m. CBC - Continue Eliquis 5 mg twice daily x3 months; will consider return to DAPT if concerns for bleeding with drop in hemoglobin.  Acute embolic stroke [facility]) MRI brain revealing few scattered subcentimeter acute ischemic infarcts of the right parieto-occipital region without hemorrhage or mass effect.  May be a sequela of left femoral vein DVT versus cardiac arrhythmia versus PFO.  Possible source of seizure-like activity; neurology following. - Stroke team to complete transcranial Doppler with bubble study today to rule out PFO - EEG with sinus rhythm on admission; and no abnormal heart sounds auscultated. TTE with LVEF 55 to 60%, no wall motion abnormalities, trivial mitral and mild aortic valve regurgitation.  Will consider cardiac monitoring upon discharge.  Osteoporosis Patient with L hip fracture after fall from standing without significant force. -Continue vitamin D3 1000 units daily and calcium carbonate 500 mg daily  Witnessed seizure-like activity ([place]) Overnight 6/12, patient with witnessed 3-minute seizure-like activity episode, and 1 to 2-hour postictal state.  Neurology consulted and made the following recommendations: Depakote 250 mg p.o. 3 times daily (VPA 43); magnesium, AST, ALT, and ammonia all largely WNL; inpatient seizure precautions.  Appreciate neurology's assistance in this patient's care. 6/13, patient with a second witnessed seizure-like episode lasting 1.5 minutes.  See progress note from this writer on 6/13 for detailed event.  Afterwards, EEG obtained which showed the following: Suggestive of cortical dysfunction from the R frontotemporal region and post-ictal state. No seizures or epileptiform discharges seen. No  further episodes since 6/13.  -- Given patient's age and renal function in the need for longer term anti-epileptic therapy, continue Keppra 250 mg daily.    Vascular dementia [facility]) On assessment 6/12 AM, patient oriented to self (name but not age-stated 89), place, and year (initially stated 1991, but caught herself and stated 25 without prompting).  On admission, patient was oriented to situation.  Per family, this appears to be her baseline.  Patient continues to maintain her baseline.  Fall Presented with a fall from standing after tripping while turning without the assistance of her walker.  Hypoxia-resolved as of 09/17/2021 Patient was not hypoxic on admission.  Took 1 dose of fentanyl in ED, and desat to 79% approximately 20 minutes after medication.  Restored sats with 2 LNC.  Suspect due to fentanyl dose, but in light of patient's age and long bone fracture, considered VQ scan. - VQ scan not obtained, as symptoms resolved by the time schedule permitted scan to be completed.    Chronic conditions: Gout-allopurinol 200 mg daily History of CVA HLD-Daily aspirin discontinued in light of starting Eliquis; continue Lipitor 20 mg Dementia-memantine 10 mg GERD-Protonix 40 mg   FEN/GI: Regular PPx: Eliquis Dispo:SNF  pending bed offer . Barriers include offer and insurance authorization.   Subjective:  Patient reports feeling well this morning, denies pain and acute concerns or complaints. She is pleasantly confused regarding situation, but is oriented to person and time.  Objective: Temp:  [97.8 F (36.6 C)-98 F (36.7 C)] 97.8 F (36.6 C) (06/16 1245) Pulse Rate:  [93-105] 97 (06/16 1245) Resp:  [17-19] 18 (06/16 1245) BP: (131-146)/(58-72) 146/58 (06/16 1245) SpO2:  [94 %-99 %] 99 % (06/16 1245) Physical Exam: General: Alert, cooperative, pleasant elderly female patient in NAD Cardiovascular: RRR Respiratory: Normal WOB on room air Abdomen: Soft, NT/ND Extremities:  Nonedematous BLE Neuro: Facial sensation intact and symmetric.  Sensation of right lower extremity intact to light touch alone, but unable to identify when both lower extremities touched at same time.  Laboratory: Most recent CBC Lab Results  Component Value Date   WBC 9.1 09/20/2021   HGB 7.7 (L) 09/20/2021   HCT 22.7 (L) 09/20/2021   MCV 87.0 09/20/2021   PLT 176 09/20/2021   Most recent BMP    Latest Ref Rng & Units 09/20/2021    2:31 AM  BMP  Glucose 70 - 99 mg/dL 123   BUN 8 - 23 mg/dL 35   Creatinine 0.44 - 1.00 mg/dL 2.14   Sodium 135 - 145 mmol/L 136   Potassium 3.5 - 5.1 mmol/L 4.3   Chloride 98 - 111 mmol/L 106   CO2 22 - 32 mmol/L 25   Calcium 8.9 - 10.3 mg/dL 7.7      Imaging/Diagnostic Tests: No new images to review  Rosezetta Schlatter, MD 09/20/2021, 1:53 PM  PGY-1, [place] Intern pager: [phone], text pages welcome Secure chat group [facility] Teaching Service

## 2021-09-20 NOTE — Plan of Care (Signed)
Pt tolerating well on eliquis, no acute event overnight. Neuro stable. Had TCD bubble study showed no PFO. The etiology of stroke still not clear, could be occult afib. However, since pt on anticoagulation, will defer further cardioembolic work up at this time.   Neurology will sign off. Please call with questions. Pt will follow up with stroke clinic Dr. Leonie Man at [facility] in about 4 weeks. Thanks for the consult.  Rosalin Hawking, MD PhD Stroke Neurology 09/20/2021 5:10 PM

## 2021-09-20 NOTE — Progress Notes (Signed)
    Subjective:   Patient is a poor historian due to underlying vascular dementia.  Patient does not report any pain.  Patient states she is doing well.  She denies any tingling and numbness in LE bilaterally.   Objective:   VITALS:   Vitals:   09/19/21 2018 09/20/21 0504 09/20/21 1245 09/20/21 1546  BP: (!) 146/66 (!) 143/68 (!) 146/58 132/62  Pulse: 95 (!) 105 97 95  Resp: 17 19 18 18   Temp: 97.8 F (36.6 C) 97.9 F (36.6 C) 97.8 F (36.6 C) 98.3 F (36.8 C)  TempSrc: Oral Oral Oral Oral  SpO2: 98% 94% 99% 98%  Weight:      Height:        Patient is sitting up in recliner. NAD. Patients niece at bedside.  ABD soft Neurovascular intact Sensation intact distally Intact pulses distally Dorsiflexion/Plantar flexion intact Incision: dressing C/D/I No cellulitis present Painless log rolling of the hip. No pain with palpation over greater trochanteric region.   Lab Results  Component Value Date   WBC 10.8 (H) 09/20/2021   HGB 9.6 (L) 09/20/2021   HCT 28.2 (L) 09/20/2021   MCV 86.8 09/20/2021   PLT 247 09/20/2021   BMET    Component Value Date/Time   NA 136 09/20/2021 0231   K 4.3 09/20/2021 0231   CL 106 09/20/2021 0231   CO2 25 09/20/2021 0231   GLUCOSE 123 (H) 09/20/2021 0231   BUN 35 (H) 09/20/2021 0231   CREATININE 2.14 (H) 09/20/2021 0231   CALCIUM 7.7 (L) 09/20/2021 0231   GFRNONAA 20 (L) 09/20/2021 0231     Assessment/Plan: 4 Days Post-Op   Principal Problem:   Displaced fracture of left femoral neck (HCC) Active Problems:   Primary hypertension   Anemia due to stage 4 chronic kidney disease (HCC)   Chronic renal failure syndrome   Fall   Vascular dementia [facility])   Witnessed seizure-like activity ([place])   Osteoporosis   Left homonymous hemianopsia   Acute embolic stroke ([place])   Acute deep vein thrombosis (DVT) of left femoral vein ([place])  Patient was found to have DVT in LLE 09/19/21 including the left femoral, popliteal,and left posterior  tibial veins. Patient was started on Eliquis 5mg  BID per hospitalist team. Happy to have their input and recommendations. Continue to monitor patients hemoglobin and adjust anticoagulation accordingly.   WBAT with walker DVT ppx: Eliquis 5mg  SCDs, TEDS PO pain control PT/OT Dispo: Patient under hospitalist care, disposition per their recommendation. Continue PT. Pain medication and DVT ppx printed in chart. Patient to follow-up with Dr. Lyla Glassing 2 weeks postoperatively.    Charlott Rakes, PA-C 09/20/2021, 5:08 PM  [facility]  [place] [address], [place], [place] [address] Phone: [phone] [email or web address] Facebook  [employer]

## 2021-09-20 NOTE — Progress Notes (Signed)
FPTS Brief Progress Note  S: sleeping   O: BP (!) 146/66 (BP Location: Left Arm)   Pulse 95   Temp 97.8 F (36.6 C) (Oral)   Resp 17   Ht 4\' 9"  (1.448 m)   Wt 41.8 kg   SpO2 98%   BMI 19.94 kg/m     A/P: - Orders reviewed. Labs for AM ordered, which was adjusted as needed.   Gladys Damme, MD 09/20/2021, 2:25 AM PGY-3, [facility] Night Resident  Please page [phone] with questions.

## 2021-09-21 DIAGNOSIS — I639 Cerebral infarction, unspecified: Secondary | ICD-10-CM | POA: Diagnosis not present

## 2021-09-21 DIAGNOSIS — S72002A Fracture of unspecified part of neck of left femur, initial encounter for closed fracture: Secondary | ICD-10-CM | POA: Diagnosis not present

## 2021-09-21 DIAGNOSIS — I82412 Acute embolism and thrombosis of left femoral vein: Secondary | ICD-10-CM | POA: Diagnosis not present

## 2021-09-21 LAB — CBC
HCT: 25.6 % — ABNORMAL LOW (ref 36.0–46.0)
HCT: 26.6 % — ABNORMAL LOW (ref 36.0–46.0)
Hemoglobin: 8.5 g/dL — ABNORMAL LOW (ref 12.0–15.0)
Hemoglobin: 8.9 g/dL — ABNORMAL LOW (ref 12.0–15.0)
MCH: 29 pg (ref 26.0–34.0)
MCH: 29.8 pg (ref 26.0–34.0)
MCHC: 33.2 g/dL (ref 30.0–36.0)
MCHC: 33.5 g/dL (ref 30.0–36.0)
MCV: 87.4 fL (ref 80.0–100.0)
MCV: 89 fL (ref 80.0–100.0)
Platelets: 253 10*3/uL (ref 150–400)
Platelets: 272 10*3/uL (ref 150–400)
RBC: 2.93 MIL/uL — ABNORMAL LOW (ref 3.87–5.11)
RBC: 2.99 MIL/uL — ABNORMAL LOW (ref 3.87–5.11)
RDW: 13 % (ref 11.5–15.5)
RDW: 13.2 % (ref 11.5–15.5)
WBC: 8.6 10*3/uL (ref 4.0–10.5)
WBC: 9 10*3/uL (ref 4.0–10.5)
nRBC: 0 % (ref 0.0–0.2)
nRBC: 0.2 % (ref 0.0–0.2)

## 2021-09-21 LAB — BASIC METABOLIC PANEL
Anion gap: 8 (ref 5–15)
BUN: 38 mg/dL — ABNORMAL HIGH (ref 8–23)
CO2: 25 mmol/L (ref 22–32)
Calcium: 8.1 mg/dL — ABNORMAL LOW (ref 8.9–10.3)
Chloride: 101 mmol/L (ref 98–111)
Creatinine, Ser: 2.06 mg/dL — ABNORMAL HIGH (ref 0.44–1.00)
GFR, Estimated: 21 mL/min — ABNORMAL LOW (ref >60)
Glucose, Bld: 114 mg/dL — ABNORMAL HIGH (ref 70–99)
Potassium: 4.3 mmol/L (ref 3.5–5.1)
Sodium: 134 mmol/L — ABNORMAL LOW (ref 135–145)

## 2021-09-21 NOTE — Progress Notes (Signed)
FPTS Brief Progress Note  S Saw patient at bedside this evening. Patient reports lower abdominal pain which started earlier this afternoon. RN at bedside reports pt had a BM today.   O: BP (!) 152/68 (BP Location: Left Arm)   Pulse 100   Temp 98.7 F (37.1 C) (Oral)   Resp 20   Ht 4\' 9"  (1.448 m)   Wt 41.8 kg   SpO2 100%   BMI 19.94 kg/m    General: Alert, no acute distress Cardio: Normal S1 and S2, RRR, no r/m/g Pulm: CTAB, normal work of breathing Abdomen: Bowel sounds normal. Abdomen soft, non distended Neuro: Cranial nerves grossly intact   A/P: Plan per day team  -Obtain UA  -Tylenol PRN for pain -Senna BID  Lattie Haw, MD 09/21/2021, 10:19 PM PGY-3, [place] [facility] Night Resident  Please page [phone] with questions.

## 2021-09-21 NOTE — Progress Notes (Signed)
Spoke to pt's niece and provided current SNF offers. Niece reports that after reviewing [facility] and [facility], they no longer want these facilities and request [facility]. Message sent to [facility] with [facility] admissions requesting they make a determination re ability to offer a bed. SW encouraged pt's niece to have a back up facility to [facility].   Wandra Feinstein, MSW, LCSW [phone] (coverage)

## 2021-09-21 NOTE — Progress Notes (Signed)
Daily Progress Note Intern Pager: [phone]  Patient name: Tracy [name] Medical record number: [id number] Date of birth: 03/21/1920 Age: 88 y.o. Gender: female  Primary Care Provider: Charlane Ferretti, MD Consultants: Tracy [name], Neuro (s/o) Code Status: DNR  Pt Overview and Major Events to Date:  6/11: Admitted 6/12: To OR for total left hip arthroplasty 6/12: Witnessed seizure like activity; neurology consulted and EEG completed 6/13: Second witnessed seizure-like activity lasting 1.5 minutes 6/14: MRI brain with few scattered acute ischemic infarcts of right parieto-occipital region. 6/15: LE Doppler with acute DVT of left femoral vein  Assessment and Plan:  Tracy [name] is a 88 year old female who presented with a femoral neck fracture after fall from standing.  POD 5 left total arthroplasty. Pertinent PMH/PSH includes multiple strokes complicated by vascular dementia, CKD stage IV, HTN, and HLD.   * Displaced fracture of left femoral neck ([place]) Patient presented with fall from standing as she turned around to walk without her walker.  X-ray of left femur with displaced fracture in the neck of the proximal left femur. Orthopedics completed left total arthroplasty, patient ok to weight bare as tolerated. Pain well controlled this morning, patient can weight bare as tolerated. Hgb drop from 9.6 to 8.5 this morning, no signs of anemia or bleeding, will recheck this afternoon. Transfusion threshold 7-8. PT/OT recommending SNF placement; family in agreement. -Pain control with Tylenol 650 mg every 6 hours and Roxicodone 2.5 mg every 6 hours as needed breakthrough pain -Fall precautions -Delirium precautions -PM CBC  Primary hypertension 24-hour BP range 130-150/50-70. Home losartan discontinued in light of renal function. - Continue amlodipine 10 mg daily  Chronic renal failure syndrome IVF d/cd yesterday. Creatinine improved today at 2.06 from 2.14 previously; baseline appears  1.9-2.2. -Continue to monitor -Avoid nephrotoxic agents.  -AM BMP  Acute deep vein thrombosis (DVT) of left femoral vein (HCC) LE Doppler with evidence of L femoral vein DVT. Patient d/c DAPT and started on DOAC. Hgb this am 8.5 but was previously 9.6. w/ no signs/symptoms of bleeding. Will continue to monitor.  - AM CBC - Continue Eliquis 5 mg twice daily x3 months; consider return to DAPT if concerns for bleeding with drop in hemoglobin.  Acute embolic stroke [facility]) MRI brain revealing few scattered subcentimeter acute ischemic infarcts of the right parieto-occipital region without hemorrhage or mass effect.  May be a sequela of left femoral vein DVT versus occult A. Fib. Transcranial Doppler w/ bubble study today negative for PFO. Stroke thought to be likely source of seizure-like activity; neurology signed off. -Continue apixiban 5 mg BID -Will consider cardiac monitoring upon discharge.  Osteoporosis Patient with L hip fracture after fall from standing without significant force. -Continue vitamin D3 1000 units daily and calcium carbonate 500 mg daily  Witnessed seizure-like activity ([place]) Overnight 6/12, patient with witnessed 3-minute seizure-like activity episode, and 1 to 2-hour postictal state.  Neurology consulted and started Depakote. 6/13, patient with a second witnessed seizure-like episode lasting 1.5 minutes. Afterwards, EEG obtained suggestive of cortical dysfunction from the R frontotemporal region and post-ictal state. No seizures or epileptiform discharges seen. No further episodes since 6/13. Patient later switched to [place] given age, renal function, and need for long term anti-epileptic therapy.  -- Continue Keppra 250 mg daily.    Vascular dementia [facility]) On assessment 6/12 AM, patient oriented to self (name but not age-stated 89), place, year (initially stated 1991, but caught herself and stated 25 without prompting), and situation.  Per family, this appears to be her  baseline.  Patient was alert and oriented to person, city, and reason for hospitalization on exam this morning. She is near her baseline.  Fall Presented with a fall from standing after tripping while turning without the assistance of her walker.  Hypoxia-resolved as of 09/17/2021 Patient was not hypoxic on admission.  Took 1 dose of fentanyl in ED, and desat to 79% approximately 20 minutes after medication.  Restored sats with 2 LNC.  Suspect due to fentanyl dose, but in light of patient's age and long bone fracture, considered VQ scan. - VQ scan not obtained, as symptoms resolved by the time schedule permitted scan to be completed.    FEN/GI: Regular diet PPx: Eliquis Dispo:SNF  pending bed offer . Barriers include bed offer/insurance.   Subjective:  Patient doing well this morning, with no complaints of pain.  Objective: Temp:  [97.8 F (36.6 C)-98.3 F (36.8 C)] 98 F (36.7 C) (06/17 0747) Pulse Rate:  [95-103] 95 (06/17 0747) Resp:  [17-19] 18 (06/17 0747) BP: (132-153)/(58-70) 152/69 (06/17 0747) SpO2:  [98 %-100 %] 100 % (06/17 0747) Physical Exam: General: Frail, elderly, well appearing, NAD, African American woman Cardiovascular: RRR, NRMG Respiratory: CTABL Abdomen: Soft, NT/ND Extremities: Warm, well perfused, cap refill < 2 sec, no edema, left hip with clean, dry, intact bandage, no swelling or erythema  Laboratory: Most recent CBC Lab Results  Component Value Date   WBC 8.6 09/21/2021   HGB 8.5 (L) 09/21/2021   HCT 25.6 (L) 09/21/2021   MCV 87.4 09/21/2021   PLT 253 09/21/2021   Most recent BMP    Latest Ref Rng & Units 09/21/2021    2:54 AM  BMP  Glucose 70 - 99 mg/dL 114   BUN 8 - 23 mg/dL 38   Creatinine 0.44 - 1.00 mg/dL 2.06   Sodium 135 - 145 mmol/L 134   Potassium 3.5 - 5.1 mmol/L 4.3   Chloride 98 - 111 mmol/L 101   CO2 22 - 32 mmol/L 25   Calcium 8.9 - 10.3 mg/dL 8.1      Tracy Bouche, MD 09/21/2021, 8:00 AM  PGY-1, [place] Intern pager: [phone], text pages welcome Secure chat group [facility] Teaching Service

## 2021-09-21 NOTE — Plan of Care (Signed)
  Problem: Activity: Goal: Risk for activity intolerance will decrease Outcome: Progressing   Problem: Nutrition: Goal: Adequate nutrition will be maintained Outcome: Progressing   Problem: Coping: Goal: Level of anxiety will decrease Outcome: Progressing   Problem: Pain Managment: Goal: General experience of comfort will improve Outcome: Progressing   

## 2021-09-21 NOTE — Progress Notes (Signed)
FPTS Brief Progress Note  S:Went bedside to see patient. Patient sleeping, did not disturb.   O: BP (!) 153/70 (BP Location: Right Arm)   Pulse (!) 103   Temp 97.9 F (36.6 C) (Oral)   Resp 17   Ht 4\' 9"  (1.448 m)   Wt 41.8 kg   SpO2 100%   BMI 19.94 kg/m     A/P: - Plans per day team - Orders reviewed. Labs for AM ordered, which was adjusted as needed.   Holley Bouche, MD 09/21/2021, 1:45 AM PGY-1, [facility] Night Resident  Please page [phone] with questions.

## 2021-09-22 DIAGNOSIS — N184 Chronic kidney disease, stage 4 (severe): Secondary | ICD-10-CM | POA: Diagnosis not present

## 2021-09-22 DIAGNOSIS — N189 Chronic kidney disease, unspecified: Secondary | ICD-10-CM | POA: Diagnosis not present

## 2021-09-22 DIAGNOSIS — I82412 Acute embolism and thrombosis of left femoral vein: Secondary | ICD-10-CM | POA: Diagnosis not present

## 2021-09-22 DIAGNOSIS — S72002A Fracture of unspecified part of neck of left femur, initial encounter for closed fracture: Secondary | ICD-10-CM | POA: Diagnosis not present

## 2021-09-22 LAB — URINALYSIS, ROUTINE W REFLEX MICROSCOPIC
Bilirubin Urine: NEGATIVE
Glucose, UA: NEGATIVE mg/dL
Hgb urine dipstick: NEGATIVE
Ketones, ur: NEGATIVE mg/dL
Leukocytes,Ua: NEGATIVE
Nitrite: NEGATIVE
Protein, ur: 100 mg/dL — AB
Specific Gravity, Urine: 1.015 (ref 1.005–1.030)
pH: 5 (ref 5.0–8.0)

## 2021-09-22 LAB — CBC
HCT: 28.5 % — ABNORMAL LOW (ref 36.0–46.0)
Hemoglobin: 9.3 g/dL — ABNORMAL LOW (ref 12.0–15.0)
MCH: 28.8 pg (ref 26.0–34.0)
MCHC: 32.6 g/dL (ref 30.0–36.0)
MCV: 88.2 fL (ref 80.0–100.0)
Platelets: 287 10*3/uL (ref 150–400)
RBC: 3.23 MIL/uL — ABNORMAL LOW (ref 3.87–5.11)
RDW: 13 % (ref 11.5–15.5)
WBC: 11.1 10*3/uL — ABNORMAL HIGH (ref 4.0–10.5)
nRBC: 0 % (ref 0.0–0.2)

## 2021-09-22 LAB — BASIC METABOLIC PANEL
Anion gap: 12 (ref 5–15)
BUN: 45 mg/dL — ABNORMAL HIGH (ref 8–23)
CO2: 23 mmol/L (ref 22–32)
Calcium: 9 mg/dL (ref 8.9–10.3)
Chloride: 101 mmol/L (ref 98–111)
Creatinine, Ser: 2.11 mg/dL — ABNORMAL HIGH (ref 0.44–1.00)
GFR, Estimated: 21 mL/min — ABNORMAL LOW (ref >60)
Glucose, Bld: 137 mg/dL — ABNORMAL HIGH (ref 70–99)
Potassium: 4.9 mmol/L (ref 3.5–5.1)
Sodium: 136 mmol/L (ref 135–145)

## 2021-09-22 NOTE — Plan of Care (Signed)
  Problem: Clinical Measurements: Goal: Will remain free from infection Outcome: Progressing Goal: Diagnostic test results will improve Outcome: Progressing Goal: Respiratory complications will improve Outcome: Progressing Goal: Cardiovascular complication will be avoided Outcome: Progressing   Problem: Nutrition: Goal: Adequate nutrition will be maintained Outcome: Progressing   Problem: Coping: Goal: Level of anxiety will decrease Outcome: Progressing

## 2021-09-22 NOTE — Plan of Care (Signed)
Pt is alert oriented x 2-4, moments of confusion. Pt has scheduled pain medication. Dressing to left hip intact. Pt attempted to get out of bed, confused x 2. Reoriented. Pt had large bowel movement. Pt c/o abdominal pain, provider ordered UA. Obtained and sent to lab.     Problem: Education: Goal: Knowledge of General Education information will improve Description: Including pain rating scale, medication(s)/side effects and non-pharmacologic comfort measures Outcome: Progressing   Problem: Health Behavior/Discharge Planning: Goal: Ability to manage health-related needs will improve Outcome: Progressing   Problem: Clinical Measurements: Goal: Ability to maintain clinical measurements within normal limits will improve Outcome: Progressing Goal: Will remain free from infection Outcome: Progressing Goal: Diagnostic test results will improve Outcome: Progressing Goal: Respiratory complications will improve Outcome: Progressing Goal: Cardiovascular complication will be avoided Outcome: Progressing   Problem: Activity: Goal: Risk for activity intolerance will decrease Outcome: Progressing   Problem: Nutrition: Goal: Adequate nutrition will be maintained Outcome: Progressing   Problem: Coping: Goal: Level of anxiety will decrease Outcome: Progressing   Problem: Elimination: Goal: Will not experience complications related to bowel motility Outcome: Progressing Goal: Will not experience complications related to urinary retention Outcome: Progressing   Problem: Pain Managment: Goal: General experience of comfort will improve Outcome: Progressing   Problem: Safety: Goal: Ability to remain free from injury will improve Outcome: Progressing   Problem: Skin Integrity: Goal: Risk for impaired skin integrity will decrease Outcome: Progressing

## 2021-09-22 NOTE — Progress Notes (Signed)
Daily Progress Note Intern Pager: [phone]  Patient name: Tracy [name] Medical record number: [id number] Date of birth: 1919/12/23 Age: 88 y.o. Gender: female  Primary Care Provider: Charlane Ferretti, MD Consultants: Ortho Code Status: DNR  Assessment and Plan:  Tracy [name] is a 88 y.o. female admitted after a fall in which she sustained a left hip fracture that is now s/p arthroplasty as well as new CVA and DVT. Pertinent PMH/PSH includes HTN, CKD stage 4, CVA and vascular dementia.    * Displaced fracture of left femoral neck (HCC) Patient hgb stable at 9.3 improved from 8. Transfusion threshold 7-8. PT/OT recommending SNF placement; family in agreement. -Pain control with Tylenol 650 mg every 6 hours and Roxicodone 2.5 mg every 6 hours as needed breakthrough pain -Fall precautions -Delirium precautions -PM CBC  Primary hypertension BP hypertensive overnight 219X systolic. Home losartan discontinued in light of renal function. - Continue amlodipine 10 mg daily   Chronic renal failure syndrome  Creatinine improved today at  previously; baseline appears 1.9-2.2. -Continue to monitor -Avoid nephrotoxic agents.  -AM BMP  Acute deep vein thrombosis (DVT) of left femoral vein (HCC) Hemoglobin improved to 9.3 from 8  - Continue Eliquis 5 mg twice daily x3 months  Acute embolic stroke [facility]) Will follow up with neurology as outpatient for new CVA. Bubble study completed and did not show PFO. Neurology s/o   Osteoporosis  -Continue vitamin D3 1000 units daily and calcium carbonate 500 mg daily  Witnessed seizure-like activity (HCC) No seizure activity overnight.  Patient denies any headache today. -- Continue Keppra 250 mg daily.    Vascular dementia ([place]) Stable Patient oriented to self, location, unable to tell the correct year.  Patient unaware of the reason that she has been hospitalized.    Fall Presented with a fall from standing after tripping while  turning without the assistance of her walker.  Hypoxia-resolved as of 09/17/2021 Patient was not hypoxic on admission.  Took 1 dose of fentanyl in ED, and desat to 79% approximately 20 minutes after medication.  Restored sats with 2 LNC.  Suspect due to fentanyl dose, but in light of patient's age and long bone fracture, considered VQ scan. - VQ scan not obtained, as symptoms resolved by the time schedule permitted scan to be completed.       FEN/GI:  PPx: on Eliquis   Dispo:SNF in 2-3 days. Barriers include family's choice facility.   Subjective:  Patient reports resolved lower abdominal pain. She denies SOB, HA or chest discomfort.   Objective: Temp:  [97.8 F (36.6 C)-98.7 F (37.1 C)] 97.8 F (36.6 C) (06/18 0437) Pulse Rate:  [95-101] 100 (06/18 0437) Resp:  [16-20] 16 (06/18 0437) BP: (132-157)/(55-79) 157/58 (06/18 0437) SpO2:  [97 %-100 %] 97 % (06/18 0437)  Physical Exam: General: Elderly female lying in bed supine with no acute distress Cardiovascular: Regular rate and rhythm Respiratory: Clear to auscultation without wheezing Abdomen: no tenderness with palpation  Extremities: no LE edema   Laboratory: Most recent CBC Lab Results  Component Value Date   WBC 11.1 (H) 09/22/2021   HGB 9.3 (L) 09/22/2021   HCT 28.5 (L) 09/22/2021   MCV 88.2 09/22/2021   PLT 287 09/22/2021   Most recent BMP    Latest Ref Rng & Units 09/22/2021    3:02 AM  BMP  Glucose 70 - 99 mg/dL 137   BUN 8 - 23 mg/dL 45   Creatinine 0.44 -  1.00 mg/dL 2.11   Sodium 135 - 145 mmol/L 136   Potassium 3.5 - 5.1 mmol/L 4.9   Chloride 98 - 111 mmol/L 101   CO2 22 - 32 mmol/L 23   Calcium 8.9 - 10.3 mg/dL 9.0     Imaging/Diagnostic Tests: No new imaging overnight  Eulis Foster, MD 09/22/2021, 7:11 AM  PGY-3, [place] Intern pager: [phone], text pages welcome Secure chat group [place]

## 2021-09-23 DIAGNOSIS — S72002A Fracture of unspecified part of neck of left femur, initial encounter for closed fracture: Secondary | ICD-10-CM | POA: Diagnosis not present

## 2021-09-23 DIAGNOSIS — I82412 Acute embolism and thrombosis of left femoral vein: Secondary | ICD-10-CM | POA: Diagnosis not present

## 2021-09-23 DIAGNOSIS — I639 Cerebral infarction, unspecified: Secondary | ICD-10-CM | POA: Diagnosis not present

## 2021-09-23 MED ORDER — ACETAMINOPHEN 325 MG PO TABS
650.0000 mg | ORAL_TABLET | Freq: Four times a day (QID) | ORAL | Status: DC | PRN
Start: 2021-09-23 — End: 2021-09-24
  Administered 2021-09-23 – 2021-09-24 (×3): 650 mg via ORAL
  Filled 2021-09-23 (×3): qty 2

## 2021-09-23 NOTE — Progress Notes (Addendum)
     Daily Progress Note Intern Pager: [phone]  Patient name: Tracy [name] Medical record number: [id number] Date of birth: November 23, 1919 Age: 88 y.o. Gender: female  Primary Care Provider: Charlane Ferretti, MD Consultants: Ortho Code Status: DNR  Pt Overview and Major Events to Date:  6/11: Admitted 6/12: To OR for total left hip arthroplasty 6/12: Witnessed seizure like activity; neurology consulted and EEG completed 6/13: Second witnessed seizure-like activity lasting 1.5 minutes 6/14: MRI brain with few scattered acute ischemic infarcts of right parieto-occipital region. 6/15: LE Doppler with acute DVT of left femoral vein  Assessment and Plan: Tracy [name] is a 88 y.o. female admitted after a fall in which she sustained a left hip fracture that is now s/p arthroplasty as well as new CVA and DVT. Pertinent PMH/PSH includes HTN, CKD stage 4, CVA and vascular dementia.  Patient is medically stable for discharge, awaiting SNF placement   * Displaced fracture of left femoral neck (HCC) PT/OT recommending SNF placement; family in agreement. -Pain control with Tylenol 650 mg every 6 hours prn -Fall precautions -Delirium precautions   Primary hypertension Home losartan discontinued in light of renal function. - Continue amlodipine 10 mg daily   Chronic renal failure syndrome -Avoid nephrotoxic agents.    Acute deep vein thrombosis (DVT) of left femoral vein (HCC) - Continue Eliquis 5 mg twice daily x3 months  Acute embolic stroke [facility]) Will follow up with neurology as outpatient for new CVA. Neurology s/o   Osteoporosis  -Continue vitamin D3 1000 units daily and calcium carbonate 500 mg daily  Witnessed seizure-like activity (HCC) No seizure activity overnight.  Patient denies any headache today. -- Continue Keppra 250 mg daily.       FEN/GI: Heart Healthy PPx: Eliquis Dispo:SNF pending family's choice  Subjective:  Patient states he has some mild achy  lower abdominal pain that started this morning.  No other complaints.  Was able to eat breakfast.  Objective: Temp:  [97.6 F (36.4 C)-99.2 F (37.3 C)] 98.9 F (37.2 C) (06/19 0803) Pulse Rate:  [88-94] 94 (06/19 0803) Resp:  [18-20] 18 (06/19 0803) BP: (124-147)/(57-75) 141/57 (06/19 0803) SpO2:  [96 %-100 %] 100 % (06/19 0803) Physical Exam: General: 88 year old female, alert, pleasant to speak with Cardiovascular: RRR, normal S1/S2 Respiratory: CTA B, normal effort Abdomen: Bowel sounds present, soft, nondistended, very mild tenderness to palpation in the lower quadrants, no rebound or guarding  Laboratory: Most recent CBC Lab Results  Component Value Date   WBC 11.1 (H) 09/22/2021   HGB 9.3 (L) 09/22/2021   HCT 28.5 (L) 09/22/2021   MCV 88.2 09/22/2021   PLT 287 09/22/2021   Most recent BMP    Latest Ref Rng & Units 09/22/2021    3:02 AM  BMP  Glucose 70 - 99 mg/dL 137   BUN 8 - 23 mg/dL 45   Creatinine 0.44 - 1.00 mg/dL 2.11   Sodium 135 - 145 mmol/L 136   Potassium 3.5 - 5.1 mmol/L 4.9   Chloride 98 - 111 mmol/L 101   CO2 22 - 32 mmol/L 23   Calcium 8.9 - 10.3 mg/dL 9.0      Precious Gilding, DO 09/23/2021, 12:25 PM  PGY-1, [place] Intern pager: [phone], text pages welcome Secure chat group [place]

## 2021-09-23 NOTE — Progress Notes (Signed)
FPTS Brief Note Reviewed patient's vitals, recent notes.  Vitals:   09/23/21 1606 09/23/21 2011  BP: (!) 133/58 137/73  Pulse: 93 97  Resp: 18 20  Temp: 97.9 F (36.6 C) 98.7 F (37.1 C)  SpO2: 98% 99%   At this time, no change in plan from day progress note.   Donney Dice, DO Page [phone] with questions about this patient.

## 2021-09-23 NOTE — TOC Progression Note (Addendum)
Transition of Care [facility]) - Progression Note    Patient Details  Name: Tracy [name] MRN: [id number] Date of Birth: [birth year]  Transition of Care [facility]) CM/SW [place], [place] Phone Number: 09/23/2021, 12:28 PM  Clinical Narrative:   CSW reached out to [facility] to discuss referral, and [facility] is unable to offer a bed for patient. CSW spoke with niece to provide update, and she is wanting to choose [facility]. CSW confirmed with [facility] that they have a bed available for patient. CSW asked for PT to see patient for an update, and will initiate insurance authorization once therapy update has been entered. CSW to follow.  UPDATE 3:13 PM: CSW submitted insurance authorization request, pending approval.    Expected Discharge Plan: [place] Barriers to Discharge: Continued Medical Work up, Ship broker  Expected Discharge Plan and Services Expected Discharge Plan: [place] Choice: [place] arrangements for the past 2 months: Single Family Home                                       Social Determinants of Health (SDOH) Interventions    Readmission Risk Interventions     No data to display

## 2021-09-23 NOTE — Progress Notes (Signed)
FPTS Interim Progress Note  S:Went to bedside to check on patient, she was resting comfortably. I did not wake her.   O: BP (!) 128/58 (BP Location: Left Arm)   Pulse 92   Temp 98.9 F (37.2 C) (Oral)   Resp 18   Ht 4\' 9"  (1.448 m)   Wt 41.8 kg   SpO2 97%   BMI 19.94 kg/m   General: Patient resting comfortably, in no acute distress. Resp: normal work of breathing noted  A/P: Vitals stable and orders reviewed. Awaiting SNF placement. Continue plan per day team.   Donney Dice, DO 09/23/2021, 2:42 AM PGY-2, [place] Medicine Service pager [phone]

## 2021-09-23 NOTE — Progress Notes (Signed)
Physical Therapy Treatment Patient Details Name: Tracy [name] MRN: [id number] DOB: Sep 04, 1919 Today's Date: 09/23/2021   History of Present Illness Pt is a 88 y/o F presenting to ED on 6/11 after fall at home. X ray of L femur revealing displaced fx of proximal femur. S/p L hip hemiarthroplasty anterior approach on 6/12, seizure like activity on 6/13. EEG revealing cortical dysfunction arising from R frontotemporal region and moderate diffuse encephalopathy. PMH includes L PCA CVA (2022), CKD 4, HTN, HLD, gout, and vascular dementia.  MRI 6/14 positive for Few scattered subcentimeter acute ischemic infarcts involving the  right parieto-occipital region.    PT Comments    Pt very pleasant, finishing up lunch on entry. Discussed spending time in rehab prior to going home and she reports that her niece and the neighbors will take care of her. Pt requested pt get up to recliner and pt agreeable. Pt requiring modA for bed mobility and modA for transfers and stepping transfer to recliner. Pt limited by poor safety awareness and knowledge of her deficits in presence of decreased strength, balance and endurance as well as L hip pain in weightbearing. Despite pt wishes to go home PT continues to recommend SNF level rehab prior to return home.      Recommendations for follow up therapy are one component of a multi-disciplinary discharge planning process, led by the attending physician.  Recommendations may be updated based on patient status, additional functional criteria and insurance authorization.  Follow Up Recommendations  Skilled nursing-short term rehab (<3 hours/day)     Assistance Recommended at Discharge Frequent or constant Supervision/Assistance  Patient can return home with the following Two people to help with walking and/or transfers;Assistance with cooking/housework;Direct supervision/assist for medications management;Assist for transportation;Help with stairs or ramp for entrance;A lot  of help with bathing/dressing/bathroom   Equipment Recommendations  None recommended by PT    Recommendations for Other Services       Precautions / Restrictions Precautions Precautions: Fall Restrictions LLE Weight Bearing: Weight bearing as tolerated     Mobility  Bed Mobility Overal bed mobility: Needs Assistance Bed Mobility: Supine to Sit     Supine to sit: Mod assist, HOB elevated     General bed mobility comments: pt abel to initiate moving LE to EoB with HoB elevated, can bring her trunk to upright but requires modA for pad scoot of hips to EoB    Transfers Overall transfer level: Needs assistance Equipment used: Rolling walker (2 wheels) Transfers: Sit to/from Stand, Bed to chair/wheelchair/BSC Sit to Stand: Mod assist   Step pivot transfers: Mod assist       General transfer comment: modA for coming to upright and steadying, requires modA for balance with R knee buckling with weight on L LE, despite increased cuing to wait until she was turned square to the recliner pt short sat on the edge of the recliner    Ambulation/Gait               General Gait Details: unable to ambulate due to instability and pain       Balance Overall balance assessment: Needs assistance Sitting-balance support: Feet supported Sitting balance-Leahy Scale: Fair Sitting balance - Comments: sitting EOB unaided   Standing balance support: Bilateral upper extremity supported, Reliant on assistive device for balance Standing balance-Leahy Scale: Poor Standing balance comment: reliant on external support  Cognition Arousal/Alertness: Awake/alert Behavior During Therapy: Flat affect Overall Cognitive Status: History of cognitive impairments - at baseline                                 General Comments: pt with poor understanding of her deficits, and safety        Exercises Total Joint Exercises Ankle  Circles/Pumps: AROM, Both, 10 reps, Seated    General Comments General comments (skin integrity, edema, etc.): VSS on RA      Pertinent Vitals/Pain Pain Assessment Pain Assessment: Faces Faces Pain Scale: Hurts little more Pain Location: LLE with mobililty Pain Descriptors / Indicators: Discomfort, Grimacing Pain Intervention(s): Monitored during session, Limited activity within patient's tolerance, Repositioned     PT Goals (current goals can now be found in the care plan section) Acute Rehab PT Goals PT Goal Formulation: With patient/family Time For Goal Achievement: 10/02/21 Potential to Achieve Goals: Fair Progress towards PT goals: Progressing toward goals    Frequency    Min 3X/week      PT Plan Current plan remains appropriate       AM-PAC PT "6 Clicks" Mobility   Outcome Measure  Help needed turning from your back to your side while in a flat bed without using bedrails?: A Lot Help needed moving from lying on your back to sitting on the side of a flat bed without using bedrails?: A Lot Help needed moving to and from a bed to a chair (including a wheelchair)?: A Lot Help needed standing up from a chair using your arms (e.g., wheelchair or bedside chair)?: A Lot Help needed to walk in hospital room?: Total Help needed climbing 3-5 steps with a railing? : Total 6 Click Score: 10    End of Session Equipment Utilized During Treatment: Gait belt Activity Tolerance: Patient tolerated treatment well Patient left: in chair;with call bell/phone within reach;with chair alarm set Nurse Communication: Mobility status PT Visit Diagnosis: Other abnormalities of gait and mobility (R26.89);History of falling (Z91.81);Muscle weakness (generalized) (M62.81);Pain Pain - Right/Left: Left Pain - part of body: Hip     Time: 3953-2023 PT Time Calculation (min) (ACUTE ONLY): 26 min  Charges:  $Therapeutic Activity: 23-37 mins                     Tracy [name] B. Migdalia Dk PT,  DPT Acute Rehabilitation Services Please use secure chat or  Call Office [phone]    [place] 09/23/2021, 2:13 PM

## 2021-09-24 DIAGNOSIS — E7800 Pure hypercholesterolemia, unspecified: Secondary | ICD-10-CM | POA: Diagnosis not present

## 2021-09-24 DIAGNOSIS — R799 Abnormal finding of blood chemistry, unspecified: Secondary | ICD-10-CM | POA: Diagnosis present

## 2021-09-24 DIAGNOSIS — R5381 Other malaise: Secondary | ICD-10-CM | POA: Diagnosis not present

## 2021-09-24 DIAGNOSIS — S72142D Displaced intertrochanteric fracture of left femur, subsequent encounter for closed fracture with routine healing: Secondary | ICD-10-CM | POA: Diagnosis not present

## 2021-09-24 DIAGNOSIS — M2550 Pain in unspecified joint: Secondary | ICD-10-CM | POA: Diagnosis not present

## 2021-09-24 DIAGNOSIS — Z7901 Long term (current) use of anticoagulants: Secondary | ICD-10-CM | POA: Diagnosis not present

## 2021-09-24 DIAGNOSIS — D638 Anemia in other chronic diseases classified elsewhere: Secondary | ICD-10-CM | POA: Diagnosis not present

## 2021-09-24 DIAGNOSIS — D6489 Other specified anemias: Secondary | ICD-10-CM | POA: Diagnosis not present

## 2021-09-24 DIAGNOSIS — S72002A Fracture of unspecified part of neck of left femur, initial encounter for closed fracture: Secondary | ICD-10-CM | POA: Diagnosis not present

## 2021-09-24 DIAGNOSIS — F0390 Unspecified dementia without behavioral disturbance: Secondary | ICD-10-CM | POA: Diagnosis not present

## 2021-09-24 DIAGNOSIS — I10 Essential (primary) hypertension: Secondary | ICD-10-CM | POA: Diagnosis not present

## 2021-09-24 DIAGNOSIS — N189 Chronic kidney disease, unspecified: Secondary | ICD-10-CM | POA: Diagnosis not present

## 2021-09-24 DIAGNOSIS — I82492 Acute embolism and thrombosis of other specified deep vein of left lower extremity: Secondary | ICD-10-CM | POA: Diagnosis not present

## 2021-09-24 DIAGNOSIS — N184 Chronic kidney disease, stage 4 (severe): Secondary | ICD-10-CM | POA: Diagnosis not present

## 2021-09-24 DIAGNOSIS — D72829 Elevated white blood cell count, unspecified: Secondary | ICD-10-CM | POA: Diagnosis not present

## 2021-09-24 DIAGNOSIS — F02811 Dementia in other diseases classified elsewhere, unspecified severity, with agitation: Secondary | ICD-10-CM | POA: Diagnosis not present

## 2021-09-24 DIAGNOSIS — R5383 Other fatigue: Secondary | ICD-10-CM | POA: Diagnosis not present

## 2021-09-24 DIAGNOSIS — I639 Cerebral infarction, unspecified: Secondary | ICD-10-CM | POA: Diagnosis not present

## 2021-09-24 DIAGNOSIS — D649 Anemia, unspecified: Secondary | ICD-10-CM | POA: Diagnosis not present

## 2021-09-24 DIAGNOSIS — Z4789 Encounter for other orthopedic aftercare: Secondary | ICD-10-CM | POA: Diagnosis not present

## 2021-09-24 DIAGNOSIS — D631 Anemia in chronic kidney disease: Secondary | ICD-10-CM | POA: Diagnosis not present

## 2021-09-24 DIAGNOSIS — I129 Hypertensive chronic kidney disease with stage 1 through stage 4 chronic kidney disease, or unspecified chronic kidney disease: Secondary | ICD-10-CM | POA: Diagnosis not present

## 2021-09-24 DIAGNOSIS — Z7401 Bed confinement status: Secondary | ICD-10-CM | POA: Diagnosis not present

## 2021-09-24 DIAGNOSIS — R404 Transient alteration of awareness: Secondary | ICD-10-CM | POA: Diagnosis not present

## 2021-09-24 DIAGNOSIS — M6281 Muscle weakness (generalized): Secondary | ICD-10-CM | POA: Diagnosis not present

## 2021-09-24 DIAGNOSIS — Z9181 History of falling: Secondary | ICD-10-CM | POA: Diagnosis not present

## 2021-09-24 DIAGNOSIS — R29898 Other symptoms and signs involving the musculoskeletal system: Secondary | ICD-10-CM | POA: Diagnosis not present

## 2021-09-24 DIAGNOSIS — M818 Other osteoporosis without current pathological fracture: Secondary | ICD-10-CM | POA: Diagnosis not present

## 2021-09-24 DIAGNOSIS — N19 Unspecified kidney failure: Secondary | ICD-10-CM | POA: Diagnosis not present

## 2021-09-24 MED ORDER — ACETAMINOPHEN 325 MG PO TABS
650.0000 mg | ORAL_TABLET | Freq: Four times a day (QID) | ORAL | Status: DC | PRN
Start: 2021-09-24 — End: 2021-11-10

## 2021-09-24 MED ORDER — CALCIUM CARBONATE 1250 (500 CA) MG PO TABS: 1.0000 | ORAL_TABLET | Freq: Every day | ORAL | Status: AC

## 2021-09-24 MED ORDER — AMLODIPINE BESYLATE 10 MG PO TABS: 10.0000 mg | ORAL_TABLET | Freq: Every day | ORAL | Status: AC

## 2021-09-24 MED ORDER — LEVETIRACETAM 250 MG PO TABS: 250.0000 mg | ORAL_TABLET | Freq: Every day | ORAL | Status: AC

## 2021-09-24 MED ORDER — VITAMIN D3 25 MCG PO TABS: 1000.0000 [IU] | ORAL_TABLET | Freq: Every day | ORAL | Status: AC

## 2021-09-24 MED ORDER — SENNOSIDES-DOCUSATE SODIUM 8.6-50 MG PO TABS: 1.0000 | ORAL_TABLET | Freq: Two times a day (BID) | ORAL | Status: DC

## 2021-09-24 NOTE — Discharge Summary (Addendum)
FMTS ATTENDING ADMISSION NOTE Tracy Weniger,[name] I  have seen and examined this patient, reviewed their chart. I have discussed this patient with the resident.    Tracy [name] Discharge Summary  Patient name: Tracy [name] Medical record number: [id number] Date of birth: 02/14/1920 Age: 88 y.o. Gender: female Date of Admission: 09/15/2021  Date of Discharge: 09/24/21 Admitting Physician: Tracy [name]  Primary Care Provider: Charlane Ferretti, [name] Consultants: Orthopedics, neurology  Indication for Hospitalization: Left hip fracture  Discharge Diagnoses/Problem List:  Principal Problem:   Displaced fracture of left femoral neck (HCC) Active Problems:   Primary hypertension   Chronic renal failure syndrome   Anemia due to stage 4 chronic kidney disease (Tracy [name])   Witnessed seizure-like activity (HCC)   Osteoporosis   Left homonymous hemianopsia   Acute embolic stroke (Tracy [name])   Acute deep vein thrombosis (DVT) of left femoral vein (HCC)    Disposition: SNF  Discharge Condition: Stable  Discharge Exam:  General: 88 year old female, pleasant to speak with, NAD Cardiovascular: RRR, normal S1/S2 Respiratory: CTAB, normal effort Abdomen: Bowel sounds present, soft, nontender palpation, nondistended  Brief [name] Course:  Tracy [name] is a 88 y.o. female presenting with femoral neck fracture after mechanical fall from standing.    Displaced fracture of left femoral neck [facility])  Fall Patient presenting with mechanical fall from standing. X-ray of left femur in the ED with displaced fracture in the neck of the proximal left femur; redemonstrated on pelvic x-ray with no other acute changes.  Orthopedics consulted in the ED and completed left hemiarthroplasty 09/16/21 with instructions to follow-up with Dr. Lyla [name] 2 weeks after surgery. Pain controlled initially with Roxicodone 2.5 mg every 6 hours and Tylenol 650 mg every 6 hours.  By discharge pain  was adequately controlled with Tylenol as needed.  PT followed who recommended SNF at discharge.  Witnessed seizure-like activity ([place]) Patient had 2 witnessed seizure-like episodes on 6/12 and 6/13.  EEG showed cortical dysfunction from the right frontotemporal region and postictal state.  Neurology was consulted and patient was treated initially with Depakote 250 mg p.o. 3 times daily.  Depakote was discontinued and patient was started on Keppra 250 mg daily which she was discharged on.  Acute embolic stroke   Brain MRI on 09/18/21 showed few scattered subcentimeter acute ischemic infarcts of the right parieto-occipital region without hemorrhage or mass effect.  Neurology was on board and patient was placed on DAPT.  TTE completed which shows LVEF 55 to 60%, no wall motion abnormalities, trivial mitral and mild aortic valve regurg.  Bubble study showed no PFO.  Neurology recommended patient follow-up with stroke clinic with Dr. Leonie [name] at [facility] in about 4 weeks.  Acute DVT of left femoral vein LE Doppler showed left femoral vein DVT on 09/19/2021 patient was taken off of DAPT and started on Eliquis 5 mg twice daily for 3 months.   Essential hypertension Severely elevated on admission, asymptomatic.  Likely secondary to medication nonadherence (did not take her antihypertensive on day of admission) and pain. Home losartan was discontinued and patient remained on amlodipine 10 mg daily with well-controlled blood pressures prior to discharge.  Osteoporosis Patient was started on vitamin D and calcium supplement which was continued at discharge.   Chronic kidney disease, stage 4 (severe) (HCC) Nephrotoxic agents avoided.  Losartan discontinued.  Other conditions chronic, stable and treated with home medications    Issues for follow-up: Ensure follow-up with Dr. Lyla [name] 2  weeks s/p hip hemiarthroplasty Ensure follow-up with Dr. Leonie [name] at [facility] clinic at [facility] in about 4 weeks Ensure continuation of Eliquis for 3 months.  After 3 minutes patient's can resume taking baby aspirin daily.   Significant Procedures: Left hip hemiarthroplasty 09/16/21  Significant Labs and Imaging:  Recent Labs  Lab 09/21/21 0254 09/21/21 1452 09/22/21 0302  WBC 8.6 9.0 11.1*  HGB 8.5* 8.9* 9.3*  HCT 25.6* 26.6* 28.5*  PLT 253 272 287   Recent Labs  Lab 09/18/21 0518 09/19/21 0633 09/20/21 0231 09/21/21 0254 09/22/21 0302  NA 136 136 136 134* 136  K 3.9 4.1 4.3 4.3 4.9  CL 104 103 106 101 101  CO2 23 22 25 25 23   GLUCOSE 95 110* 123* 114* 137*  BUN 28* 32* 35* 38* 45*  CREATININE 2.29* 2.26* 2.14* 2.06* 2.11*  CALCIUM 8.1* 8.2* 7.7* 8.1* 9.0     Discharge Medications:  Allergies as of 09/24/2021   No Known Allergies      Medication List     STOP taking these medications    aspirin EC 81 MG tablet   furosemide 40 MG tablet Commonly known as: LASIX   losartan 50 MG tablet Commonly known as: COZAAR       TAKE these medications    acetaminophen 325 MG tablet Commonly known as: TYLENOL Take 2 tablets (650 mg total) by mouth every 6 (six) hours as needed for mild pain.   allopurinol 100 MG tablet Commonly known as: ZYLOPRIM Take 200 mg by mouth in the morning.   amLODipine 10 MG tablet Commonly known as: NORVASC Take 1 tablet (10 mg total) by mouth daily.   apixaban 5 MG Tabs tablet Commonly known as: ELIQUIS Take 1 tablet (5 mg total) by mouth 2 (two) times daily.   atorvastatin 20 MG tablet Commonly known as: LIPITOR Take 20 mg by mouth daily.   calcium carbonate 1250 (500 Ca) MG tablet Commonly known as: OS-CAL - dosed in mg of elemental calcium Take 1 tablet (500 mg of elemental calcium total) by mouth daily with breakfast. Start taking on: September 25, 2021   Fiber Select Gummies Chew Chew 1-2 tablets by mouth daily as needed (for mild constipation symptoms).   levETIRAcetam 250 MG tablet Commonly known  as: KEPPRA Take 1 tablet (250 mg total) by mouth daily. Start taking on: September 25, 2021   memantine 10 MG tablet Commonly known as: NAMENDA TAKE 1 TABLET BY MOUTH TWICE A DAY What changed: when to take this   pantoprazole 40 MG tablet Commonly known as: PROTONIX Take 40 mg by mouth daily before breakfast.   senna-docusate 8.6-50 MG tablet Commonly known as: Senokot-S Take 1 tablet by mouth 2 (two) times daily.   Vitamin D3 25 MCG tablet Commonly known as: Vitamin D Take 1 tablet (1,000 Units total) by mouth daily. Start taking on: September 25, 2021        Discharge Instructions: Please refer to Patient Instructions section of EMR for full details.  Patient was counseled important signs and symptoms that should prompt return to medical care, changes in medications, dietary instructions, activity restrictions, and follow up appointments.   Follow-Up Appointments:  Contact information for follow-up providers     [place], Aaron Edelman, [name]. Schedule an appointment as soon as possible for a visit in 2 week(s).   Specialty: Orthopedic Surgery Why: For wound re-check, For suture removal Contact information: [address] [place] [address] [phone]  Garvin Fila, [name]. Schedule an appointment as soon as possible for a visit in 1 month(s).   Specialties: Neurology, Radiology Why: stroke clinic Contact information: [address] [place] [address] [phone]              Contact information for after-discharge care     [facility] .   Service: Skilled Nursing Contact information: [place]                     Precious Gilding, DO 09/24/2021, 1:23 PM PGY-1, [place]

## 2021-09-24 NOTE — Progress Notes (Deleted)
CPAP removed at 0730 and patient set up for breakfast

## 2021-09-24 NOTE — TOC Transition Note (Signed)
Transition of Care [facility]) - CM/SW Discharge Note   Patient Details  Name: Tracy [name] MRN: [id number] Date of Birth: Sep 30, 1919  Transition of Care [facility] CM/SW Contact:  Geralynn Ochs, LCSW Phone Number: 09/24/2021, 1:49 PM   Clinical Narrative:   CSW confirmed that authorization was received and [facility] has bed available. CSW sent discharge information and updated niece via phone. Transport scheduled with [facility] for next available.  Nurse to call report to [phone]. Nurse to also call niece, Jackelyn Poling, when Corey Harold arrives: [phone].    Final next level of care: Skilled Nursing Facility Barriers to Discharge: Barriers Resolved   Patient Goals and CMS Choice Patient states their goals for this hospitalization and ongoing recovery are:: patient unable to participate in goal setting, not oriented CMS Medicare.gov Compare Post Acute Care list provided to:: Patient Represenative (must comment) Choice offered to / list presented to : [place] / [place]  Discharge Placement              Patient chooses bed at: [facility] Patient to be transferred to facility by: [place] Name of family member notified: Debbie Patient and family notified of of transfer: 09/24/21  Discharge Plan and Services     Post Acute Care Choice: [place]                               Social Determinants of Health (SDOH) Interventions     Readmission Risk Interventions     No data to display

## 2021-09-24 NOTE — Progress Notes (Signed)
Report given to [facility] at [place] [facility]

## 2021-09-24 NOTE — Progress Notes (Incomplete)
     Daily Progress Note Intern Pager: [phone]  Patient name: Tracy [name] Medical record number: [id number] Date of birth: [birth year] Age: 88 y.o. Gender: female  Primary Care Provider: Charlane Ferretti, MD Consultants: Ortho Code Status: DNR  Pt Overview and Major Events to Date:  6/11: Admitted 6/12: To OR for total left hip arthroplasty 6/12: Witnessed seizure like activity; neurology consulted and EEG completed 6/13: Second witnessed seizure-like activity lasting 1.5 minutes 6/14: MRI brain with few scattered acute ischemic infarcts of right parieto-occipital region. 6/15: LE Doppler with acute DVT of left femoral vein  Assessment and Plan: Tracy [name] is a 88 y.o. female admitted after a fall in which she sustained a left hip fracture that is now s/p arthroplasty as well as new CVA and DVT. Pertinent PMH/PSH includes HTN, CKD stage 4, CVA and vascular dementia.   Patient is medically stable for discharge, awaiting SNF placement  * Displaced fracture of left femoral neck (HCC) PT/OT recommending SNF placement; family in agreement. -Pain control with Tylenol 650 mg every 6 hours prn -Fall precautions -Delirium precautions   Primary hypertension Home losartan discontinued in light of renal function. - Continue amlodipine 10 mg daily   Chronic renal failure syndrome -Avoid nephrotoxic agents.    Acute deep vein thrombosis (DVT) of left femoral vein (HCC) - Continue Eliquis 5 mg twice daily x3 months  Acute embolic stroke [facility]) Will follow up with neurology as outpatient for new CVA. Neurology s/o   Osteoporosis  -Continue vitamin D3 1000 units daily and calcium carbonate 500 mg daily  Witnessed seizure-like activity (HCC) No seizure activity overnight.  Patient denies any headache today. -- Continue Keppra 250 mg daily.      FEN/GI: Heart healthy PPx: Eliquis Dispo: SNF pending family's choice Subjective:  Patient states she enjoyed breakfast this  morning, has no complaints.  States her abdominal pain she had yesterday has resolved.  Objective: Temp:  [97.6 F (36.4 C)-98.9 F (37.2 C)] 97.6 F (36.4 C) (06/20 0726) Pulse Rate:  [91-98] 91 (06/20 0726) Resp:  [18-20] 18 (06/20 0726) BP: (130-141)/(53-73) 133/53 (06/20 0726) SpO2:  [98 %-100 %] 100 % (06/20 0726) Physical Exam: General: 88 year old female, pleasant to speak with, NAD Cardiovascular: RRR, normal S1/S2 Respiratory: CTAB, normal effort Abdomen: Bowel sounds present, soft, nontender palpation, nondistended   Laboratory: Most recent CBC Lab Results  Component Value Date   WBC 11.1 (H) 09/22/2021   HGB 9.3 (L) 09/22/2021   HCT 28.5 (L) 09/22/2021   MCV 88.2 09/22/2021   PLT 287 09/22/2021   Most recent BMP    Latest Ref Rng & Units 09/22/2021    3:02 AM  BMP  Glucose 70 - 99 mg/dL 137   BUN 8 - 23 mg/dL 45   Creatinine 0.44 - 1.00 mg/dL 2.11   Sodium 135 - 145 mmol/L 136   Potassium 3.5 - 5.1 mmol/L 4.9   Chloride 98 - 111 mmol/L 101   CO2 22 - 32 mmol/L 23   Calcium 8.9 - 10.3 mg/dL 9.0     Precious Gilding, DO 09/24/2021, 7:35 AM  PGY-1, [place] Intern pager: [phone], text pages welcome Secure chat group [place]

## 2021-09-24 NOTE — Progress Notes (Signed)
Occupational Therapy Treatment Patient Details Name: Tracy [name] MRN: [id number] DOB: [birth year] Today's Date: 09/24/2021   History of present illness Pt is a 88 y/o F presenting to ED on 6/11 after fall at home. X ray of L femur revealing displaced fx of proximal femur. S/p L hip hemiarthroplasty anterior approach on 6/12, seizure like activity on 6/13. EEG revealing cortical dysfunction arising from R frontotemporal region and moderate diffuse encephalopathy. PMH includes L PCA CVA (2022), CKD 4, HTN, HLD, gout, and vascular dementia.  MRI 6/14 positive for Few scattered subcentimeter acute ischemic infarcts involving the  right parieto-occipital region.   OT comments  Pt progressing towards goals, able to complete UB ADLs with set up -min A. Pt min guard for bed mobility with increased time and cues to scoot toward EOB. Pt mod A for transfer with RW, needing max directional cues as pt attempting to take steps to the L side when chair positioned to the R. Pt presenting with impairments listed below, will follow acutely. Continue to recommend SNF at d/c.   Recommendations for follow up therapy are one component of a multi-disciplinary discharge planning process, led by the attending physician.  Recommendations may be updated based on patient status, additional functional criteria and insurance authorization.    Follow Up Recommendations  Skilled nursing-short term rehab (<3 hours/day)    Assistance Recommended at Discharge Frequent or constant Supervision/Assistance  Patient can return home with the following  A lot of help with walking and/or transfers;A lot of help with bathing/dressing/bathroom;Assistance with cooking/housework;Direct supervision/assist for medications management;Direct supervision/assist for financial management;Assist for transportation;Help with stairs or ramp for entrance   Equipment Recommendations  BSC/3in1    Recommendations for Other Services PT consult     Precautions / Restrictions Precautions Precautions: Fall Restrictions Weight Bearing Restrictions: Yes LLE Weight Bearing: Weight bearing as tolerated       Mobility Bed Mobility Overal bed mobility: Needs Assistance Bed Mobility: Supine to Sit   Sidelying to sit: Min guard       General bed mobility comments: increased time and cues to scoot to EOB    Transfers Overall transfer level: Needs assistance Equipment used: Rolling walker (2 wheels) Transfers: Sit to/from Stand, Bed to chair/wheelchair/BSC Sit to Stand: Mod assist     Step pivot transfers: Mod assist     General transfer comment: max directional cues, pt turning body to L when chair positioned on the right for stand pivot transfer     Balance Overall balance assessment: Needs assistance Sitting-balance support: Feet supported Sitting balance-Leahy Scale: Fair   Postural control: Posterior lean Standing balance support: Bilateral upper extremity supported, Reliant on assistive device for balance Standing balance-Leahy Scale: Poor Standing balance comment: reliant on external support                           ADL either performed or assessed with clinical judgement   ADL Overall ADL's : Needs assistance/impaired     Grooming: Wash/dry face;Supervision/safety;Sitting           Upper Body Dressing : Minimal assistance;Sitting Upper Body Dressing Details (indicate cue type and reason): to don/doff gown     Toilet Transfer: Moderate assistance;Stand-pivot;BSC/3in1;Rolling walker (2 wheels) Toilet Transfer Details (indicate cue type and reason): simulated to chair         Functional mobility during ADLs: Moderate assistance;Rolling walker (2 wheels)      Extremity/Trunk Assessment Upper Extremity Assessment Upper Extremity Assessment:  Generalized weakness   Lower Extremity Assessment Lower Extremity Assessment: Defer to PT evaluation        Vision   Vision Assessment?:  Vision impaired- to be further tested in functional context Additional Comments: hx of L visual field ?cut vs deficits. pt overshooting/undershooting when reaching for targets on R and L side during session; pt frequently closing eyes during session, difficulty participating in visual assessment   Perception Perception Perception: Not tested   Praxis Praxis Praxis: Not tested    Cognition Arousal/Alertness: Awake/alert Behavior During Therapy: Flat affect Overall Cognitive Status: History of cognitive impairments - at baseline                                          Exercises      Shoulder Instructions       General Comments VSS on RA    Pertinent Vitals/ Pain       Pain Assessment Pain Assessment: Faces Pain Score: 2  Faces Pain Scale: Hurts a little bit Pain Location: LLE with mobililty Pain Descriptors / Indicators: Discomfort, Grimacing Pain Intervention(s): Limited activity within patient's tolerance, Monitored during session, Repositioned  Home Living                                          Prior Functioning/Environment              Frequency  Min 2X/week        Progress Toward Goals  OT Goals(current goals can now be found in the care plan section)  Progress towards OT goals: Progressing toward goals  Acute Rehab OT Goals Patient Stated Goal: none stated OT Goal Formulation: With patient Time For Goal Achievement: 10/02/21 Potential to Achieve Goals: Good ADL Goals Pt Will Perform Lower Body Dressing: with supervision;sit to/from stand;sitting/lateral leans Pt Will Transfer to Toilet: with min assist;ambulating;regular height toilet;bedside commode Additional ADL Goal #1: pt will complete bed mobility with min A in prep for ADLs Additional ADL Goal #2: Pt will be able to stand for 5 mins with min guard A in prep for ADLs  Plan Discharge plan remains appropriate;Frequency remains appropriate     Co-evaluation                 AM-PAC OT "6 Clicks" Daily Activity     Outcome Measure   Help from another person eating meals?: None Help from another person taking care of personal grooming?: A Little Help from another person toileting, which includes using toliet, bedpan, or urinal?: A Lot Help from another person bathing (including washing, rinsing, drying)?: A Lot Help from another person to put on and taking off regular upper body clothing?: A Little Help from another person to put on and taking off regular lower body clothing?: A Lot 6 Click Score: 16    End of Session Equipment Utilized During Treatment: Gait belt;Rolling walker (2 wheels)  OT Visit Diagnosis: Unsteadiness on feet (R26.81);Other abnormalities of gait and mobility (R26.89);Muscle weakness (generalized) (M62.81);Other symptoms and signs involving cognitive function   Activity Tolerance Patient tolerated treatment well   Patient Left in chair;with call bell/phone within reach;with chair alarm set   Nurse Communication Mobility status (pt needs bed change)        Time: 1114-1130 OT Time Calculation (min): 16  min  Charges: OT General Charges $OT Visit: 1 Visit OT Treatments $Self Care/Home Management : 8-22 mins  Lynnda Child, OTD, OTR/L Acute Rehab [phone] - [place] 09/24/2021, 11:56 AM

## 2021-09-25 DIAGNOSIS — I82492 Acute embolism and thrombosis of other specified deep vein of left lower extremity: Secondary | ICD-10-CM | POA: Diagnosis not present

## 2021-09-25 DIAGNOSIS — I10 Essential (primary) hypertension: Secondary | ICD-10-CM | POA: Diagnosis not present

## 2021-09-25 DIAGNOSIS — M6281 Muscle weakness (generalized): Secondary | ICD-10-CM | POA: Diagnosis not present

## 2021-09-25 DIAGNOSIS — S72142D Displaced intertrochanteric fracture of left femur, subsequent encounter for closed fracture with routine healing: Secondary | ICD-10-CM | POA: Diagnosis not present

## 2021-09-30 DIAGNOSIS — I82492 Acute embolism and thrombosis of other specified deep vein of left lower extremity: Secondary | ICD-10-CM | POA: Diagnosis not present

## 2021-09-30 DIAGNOSIS — N184 Chronic kidney disease, stage 4 (severe): Secondary | ICD-10-CM | POA: Diagnosis not present

## 2021-09-30 DIAGNOSIS — S72142D Displaced intertrochanteric fracture of left femur, subsequent encounter for closed fracture with routine healing: Secondary | ICD-10-CM | POA: Diagnosis not present

## 2021-09-30 DIAGNOSIS — M6281 Muscle weakness (generalized): Secondary | ICD-10-CM | POA: Diagnosis not present

## 2021-10-01 DIAGNOSIS — S72142D Displaced intertrochanteric fracture of left femur, subsequent encounter for closed fracture with routine healing: Secondary | ICD-10-CM | POA: Diagnosis not present

## 2021-10-01 DIAGNOSIS — I82492 Acute embolism and thrombosis of other specified deep vein of left lower extremity: Secondary | ICD-10-CM | POA: Diagnosis not present

## 2021-10-01 DIAGNOSIS — D631 Anemia in chronic kidney disease: Secondary | ICD-10-CM | POA: Diagnosis not present

## 2021-10-01 DIAGNOSIS — M6281 Muscle weakness (generalized): Secondary | ICD-10-CM | POA: Diagnosis not present

## 2021-10-02 DIAGNOSIS — S72142D Displaced intertrochanteric fracture of left femur, subsequent encounter for closed fracture with routine healing: Secondary | ICD-10-CM | POA: Diagnosis not present

## 2021-10-02 DIAGNOSIS — D631 Anemia in chronic kidney disease: Secondary | ICD-10-CM | POA: Diagnosis not present

## 2021-10-02 DIAGNOSIS — I82492 Acute embolism and thrombosis of other specified deep vein of left lower extremity: Secondary | ICD-10-CM | POA: Diagnosis not present

## 2021-10-02 DIAGNOSIS — M6281 Muscle weakness (generalized): Secondary | ICD-10-CM | POA: Diagnosis not present

## 2021-10-03 DIAGNOSIS — S72142D Displaced intertrochanteric fracture of left femur, subsequent encounter for closed fracture with routine healing: Secondary | ICD-10-CM | POA: Diagnosis not present

## 2021-10-03 DIAGNOSIS — D638 Anemia in other chronic diseases classified elsewhere: Secondary | ICD-10-CM | POA: Diagnosis not present

## 2021-10-05 DIAGNOSIS — D638 Anemia in other chronic diseases classified elsewhere: Secondary | ICD-10-CM | POA: Diagnosis not present

## 2021-10-05 DIAGNOSIS — I82492 Acute embolism and thrombosis of other specified deep vein of left lower extremity: Secondary | ICD-10-CM | POA: Diagnosis not present

## 2021-10-05 DIAGNOSIS — S72142D Displaced intertrochanteric fracture of left femur, subsequent encounter for closed fracture with routine healing: Secondary | ICD-10-CM | POA: Diagnosis not present

## 2021-10-07 DIAGNOSIS — M6281 Muscle weakness (generalized): Secondary | ICD-10-CM | POA: Diagnosis not present

## 2021-10-07 DIAGNOSIS — D638 Anemia in other chronic diseases classified elsewhere: Secondary | ICD-10-CM | POA: Diagnosis not present

## 2021-10-07 DIAGNOSIS — S72142D Displaced intertrochanteric fracture of left femur, subsequent encounter for closed fracture with routine healing: Secondary | ICD-10-CM | POA: Diagnosis not present

## 2021-10-07 DIAGNOSIS — D72829 Elevated white blood cell count, unspecified: Secondary | ICD-10-CM | POA: Diagnosis not present

## 2021-10-09 DIAGNOSIS — S72142D Displaced intertrochanteric fracture of left femur, subsequent encounter for closed fracture with routine healing: Secondary | ICD-10-CM | POA: Diagnosis not present

## 2021-10-09 DIAGNOSIS — I82492 Acute embolism and thrombosis of other specified deep vein of left lower extremity: Secondary | ICD-10-CM | POA: Diagnosis not present

## 2021-10-09 DIAGNOSIS — D638 Anemia in other chronic diseases classified elsewhere: Secondary | ICD-10-CM | POA: Diagnosis not present

## 2021-10-09 DIAGNOSIS — F02811 Dementia in other diseases classified elsewhere, unspecified severity, with agitation: Secondary | ICD-10-CM | POA: Diagnosis not present

## 2021-10-10 DIAGNOSIS — M6281 Muscle weakness (generalized): Secondary | ICD-10-CM | POA: Diagnosis not present

## 2021-10-10 DIAGNOSIS — S72142D Displaced intertrochanteric fracture of left femur, subsequent encounter for closed fracture with routine healing: Secondary | ICD-10-CM | POA: Diagnosis not present

## 2021-10-10 DIAGNOSIS — D638 Anemia in other chronic diseases classified elsewhere: Secondary | ICD-10-CM | POA: Diagnosis not present

## 2021-10-10 DIAGNOSIS — F02811 Dementia in other diseases classified elsewhere, unspecified severity, with agitation: Secondary | ICD-10-CM | POA: Diagnosis not present

## 2021-10-11 ENCOUNTER — Other Ambulatory Visit: Payer: Self-pay

## 2021-10-11 ENCOUNTER — Encounter (HOSPITAL_COMMUNITY): Payer: Self-pay | Admitting: Emergency Medicine

## 2021-10-11 ENCOUNTER — Emergency Department (HOSPITAL_COMMUNITY)
Admission: EM | Admit: 2021-10-11 | Discharge: 2021-10-11 | Disposition: A | Discharge status: Other Acute Inpatient Hospital | Payer: Medicare PPO | Attending: Emergency Medicine | Admitting: Emergency Medicine

## 2021-10-11 DIAGNOSIS — M6281 Muscle weakness (generalized): Secondary | ICD-10-CM | POA: Diagnosis not present

## 2021-10-11 DIAGNOSIS — Z7901 Long term (current) use of anticoagulants: Secondary | ICD-10-CM | POA: Insufficient documentation

## 2021-10-11 DIAGNOSIS — N189 Chronic kidney disease, unspecified: Secondary | ICD-10-CM | POA: Diagnosis not present

## 2021-10-11 DIAGNOSIS — S72142D Displaced intertrochanteric fracture of left femur, subsequent encounter for closed fracture with routine healing: Secondary | ICD-10-CM | POA: Diagnosis not present

## 2021-10-11 DIAGNOSIS — D649 Anemia, unspecified: Secondary | ICD-10-CM

## 2021-10-11 DIAGNOSIS — D638 Anemia in other chronic diseases classified elsewhere: Secondary | ICD-10-CM | POA: Diagnosis not present

## 2021-10-11 DIAGNOSIS — I129 Hypertensive chronic kidney disease with stage 1 through stage 4 chronic kidney disease, or unspecified chronic kidney disease: Secondary | ICD-10-CM | POA: Diagnosis not present

## 2021-10-11 DIAGNOSIS — F0390 Unspecified dementia without behavioral disturbance: Secondary | ICD-10-CM | POA: Insufficient documentation

## 2021-10-11 DIAGNOSIS — R799 Abnormal finding of blood chemistry, unspecified: Secondary | ICD-10-CM | POA: Diagnosis not present

## 2021-10-11 DIAGNOSIS — D6489 Other specified anemias: Secondary | ICD-10-CM | POA: Diagnosis not present

## 2021-10-11 LAB — CBC WITH DIFFERENTIAL/PLATELET
Abs Immature Granulocytes: 0.03 10*3/uL (ref 0.00–0.07)
Basophils Absolute: 0.1 10*3/uL (ref 0.0–0.1)
Basophils Relative: 1 %
Eosinophils Absolute: 0.3 10*3/uL (ref 0.0–0.5)
Eosinophils Relative: 3 %
HCT: 26.4 % — ABNORMAL LOW (ref 36.0–46.0)
Hemoglobin: 8.2 g/dL — ABNORMAL LOW (ref 12.0–15.0)
Immature Granulocytes: 0 %
Lymphocytes Relative: 25 %
Lymphs Abs: 2.5 10*3/uL (ref 0.7–4.0)
MCH: 28.5 pg (ref 26.0–34.0)
MCHC: 31.1 g/dL (ref 30.0–36.0)
MCV: 91.7 fL (ref 80.0–100.0)
Monocytes Absolute: 0.9 10*3/uL (ref 0.1–1.0)
Monocytes Relative: 9 %
Neutro Abs: 6.2 10*3/uL (ref 1.7–7.7)
Neutrophils Relative %: 62 %
Platelets: 454 10*3/uL — ABNORMAL HIGH (ref 150–400)
RBC: 2.88 MIL/uL — ABNORMAL LOW (ref 3.87–5.11)
RDW: 14.1 % (ref 11.5–15.5)
WBC: 10 10*3/uL (ref 4.0–10.5)
nRBC: 0 % (ref 0.0–0.2)

## 2021-10-11 LAB — BASIC METABOLIC PANEL
Anion gap: 9 (ref 5–15)
BUN: 41 mg/dL — ABNORMAL HIGH (ref 8–23)
CO2: 20 mmol/L — ABNORMAL LOW (ref 22–32)
Calcium: 8.2 mg/dL — ABNORMAL LOW (ref 8.9–10.3)
Chloride: 107 mmol/L (ref 98–111)
Creatinine, Ser: 1.76 mg/dL — ABNORMAL HIGH (ref 0.44–1.00)
GFR, Estimated: 26 mL/min — ABNORMAL LOW (ref >60)
Glucose, Bld: 145 mg/dL — ABNORMAL HIGH (ref 70–99)
Potassium: 4.8 mmol/L (ref 3.5–5.1)
Sodium: 136 mmol/L (ref 135–145)

## 2021-10-11 LAB — POC OCCULT BLOOD, ED: Fecal Occult Bld: NEGATIVE

## 2021-10-11 NOTE — ED Triage Notes (Signed)
Per [facility] pt coming from [place] [facility] pt supposed to be getting discharged home and had labs drawn. Hemoglobin resulted at 6.4. patient has hx of dementia and according to facility is at her baseline. Patient has no complaints.

## 2021-10-11 NOTE — ED Notes (Signed)
Called [facility] to transport this patient back to [facility], per [place] about 3:30pm.

## 2021-10-11 NOTE — ED Provider Notes (Signed)
[place] [facility] DEPT Provider Note   CSN: [id number] Arrival date & time: 10/11/21  1410     History  Chief Complaint  Patient presents with   Abnormal Lab    Tracy [name] is a 88 y.o. female.  HPI 88 year old female sent in from skilled nursing facility with reports of hemoglobin of 6.  Report obtained from RN, via EMS, with report from [facility] reports that patient was supposed to be discharged home and had labs drawn.  They report that her hemoglobin was 6.4.  Patient patient has history of dementia and according to her facility is at baseline.  The patient did not have any complaints.     Home Medications Prior to Admission medications   Medication Sig Start Date End Date Taking? Authorizing Provider  acetaminophen (TYLENOL) 325 MG tablet Take 2 tablets (650 mg total) by mouth every 6 (six) hours as needed for mild pain. 09/24/21   Alcus Dad, MD  allopurinol (ZYLOPRIM) 100 MG tablet Take 200 mg by mouth in the morning.    [provider]  amLODipine (NORVASC) 10 MG tablet Take 1 tablet (10 mg total) by mouth daily. 09/24/21   Alcus Dad, MD  apixaban (ELIQUIS) 5 MG TABS tablet Take 1 tablet (5 mg total) by mouth 2 (two) times daily. 09/20/21   Hill, Marciano Sequin, PA-C  atorvastatin (LIPITOR) 20 MG tablet Take 20 mg by mouth daily.    [provider]  calcium carbonate (OS-CAL - DOSED IN MG OF ELEMENTAL CALCIUM) 1250 (500 Ca) MG tablet Take 1 tablet (500 mg of elemental calcium total) by mouth daily with breakfast. 09/25/21   Alcus Dad, MD  cholecalciferol (VITAMIN D) 25 MCG tablet Take 1 tablet (1,000 Units total) by mouth daily. 09/25/21   Alcus Dad, MD  Fiber Select Gummies CHEW Chew 1-2 tablets by mouth daily as needed (for mild constipation symptoms).    [provider]  levETIRAcetam (KEPPRA) 250 MG tablet Take 1 tablet (250 mg total) by mouth daily. 09/25/21   Alcus Dad, MD  memantine (NAMENDA) 10  MG tablet TAKE 1 TABLET BY MOUTH TWICE A DAY Patient taking differently: Take 10 mg by mouth in the morning. 08/27/21   Frann Rider, NP  pantoprazole (PROTONIX) 40 MG tablet Take 40 mg by mouth daily before breakfast.    [provider]  senna-docusate (SENOKOT-S) 8.6-50 MG tablet Take 1 tablet by mouth 2 (two) times daily. 09/24/21   Alcus Dad, MD      Allergies    Patient has no known allergies.    Review of Systems   Review of Systems  Physical Exam Updated Vital Signs BP (!) 159/66   Pulse 97   Temp 97.9 F (36.6 C) (Rectal)   Resp 20   Ht 1.448 m (4\' 9" )   Wt 41.8 kg   SpO2 99%   BMI 19.94 kg/m  Physical Exam Vitals reviewed.  Constitutional:      Appearance: Normal appearance.  HENT:     Head: Normocephalic and atraumatic.     Right Ear: External ear normal.     Left Ear: External ear normal.     Nose: Nose normal.     Mouth/Throat:     Pharynx: Oropharynx is clear.  Eyes:     Conjunctiva/sclera: Conjunctivae normal.     Pupils: Pupils are equal, round, and reactive to light.  Cardiovascular:     Rate and Rhythm: Normal rate and regular rhythm.  Pulmonary:  Effort: Pulmonary effort is normal.     Breath sounds: Normal breath sounds.  Abdominal:     General: Bowel sounds are normal.     Comments: Abdomen is distended with bladder with 600 cc per bladder scan  Musculoskeletal:        General: Normal range of motion.     Cervical back: Normal range of motion.     Comments: Well-healed left hip scar  Skin:    General: Skin is warm and dry.     Capillary Refill: Capillary refill takes less than 2 seconds.  Neurological:     General: No focal deficit present.     Mental Status: She is alert.     Comments: Patient oriented to name place and year  Psychiatric:        Mood and Affect: Mood normal.     ED Results / Procedures / Treatments   Labs (all labs ordered are listed, but only abnormal results are displayed) Labs Reviewed  CBC  WITH DIFFERENTIAL/PLATELET - Abnormal; Notable for the following components:      Result Value   RBC 2.88 (*)    Hemoglobin 8.2 (*)    HCT 26.4 (*)    Platelets 454 (*)    All other components within normal limits  BASIC METABOLIC PANEL - Abnormal; Notable for the following components:   CO2 20 (*)    Glucose, Bld 145 (*)    BUN 41 (*)    Creatinine, Ser 1.76 (*)    Calcium 8.2 (*)    GFR, Estimated 26 (*)    All other components within normal limits  POC OCCULT BLOOD, ED    EKG None  Radiology No results found.  Procedures Procedures    Medications Ordered in ED Medications - No data to display  ED Course/ Medical Decision Making/ A&P                           Medical Decision Making Patient seen and evaluated appears at baseline This is a 88 year old lady who was sent in from nursing home with reports that she had a hemoglobin of 6 Hemoglobin obtained here is 8.2 and review of prior revealed this is stable. Patient being evaluated with chemistry  She is hemodynamically stable Chronic renal insufficiency which is stable from her last discharge Will DC back to facility  Amount and/or Complexity of Data Reviewed Labs: ordered.          Final Clinical Impression(s) / ED Diagnoses Final diagnoses:  Chronic anemia  Chronic kidney disease, unspecified CKD stage    Rx / DC Orders ED Discharge Orders     None         Pattricia Boss, MD 10/11/21 1519

## 2021-10-11 NOTE — Discharge Instructions (Addendum)
Patient's hemoglobin here in the emergency department is 8.2.  Her blood counts have been in this range recently.  There is no evidence of bleeding.  Her heart rate and blood pressure are normal Patient can follow-up as needed with her primary care doctor.

## 2021-10-15 DIAGNOSIS — Z96642 Presence of left artificial hip joint: Secondary | ICD-10-CM | POA: Diagnosis not present

## 2021-10-15 DIAGNOSIS — Z471 Aftercare following joint replacement surgery: Secondary | ICD-10-CM | POA: Diagnosis not present

## 2021-10-15 DIAGNOSIS — S72002D Fracture of unspecified part of neck of left femur, subsequent encounter for closed fracture with routine healing: Secondary | ICD-10-CM | POA: Diagnosis not present

## 2021-10-15 DIAGNOSIS — S72002A Fracture of unspecified part of neck of left femur, initial encounter for closed fracture: Secondary | ICD-10-CM | POA: Diagnosis not present

## 2021-10-18 DIAGNOSIS — I10 Essential (primary) hypertension: Secondary | ICD-10-CM | POA: Diagnosis not present

## 2021-10-18 DIAGNOSIS — R569 Unspecified convulsions: Secondary | ICD-10-CM | POA: Diagnosis not present

## 2021-10-18 DIAGNOSIS — I824Z2 Acute embolism and thrombosis of unspecified deep veins of left distal lower extremity: Secondary | ICD-10-CM | POA: Diagnosis not present

## 2021-10-18 DIAGNOSIS — S72002D Fracture of unspecified part of neck of left femur, subsequent encounter for closed fracture with routine healing: Secondary | ICD-10-CM | POA: Diagnosis not present

## 2021-10-18 DIAGNOSIS — N184 Chronic kidney disease, stage 4 (severe): Secondary | ICD-10-CM | POA: Diagnosis not present

## 2021-10-18 DIAGNOSIS — Z8673 Personal history of transient ischemic attack (TIA), and cerebral infarction without residual deficits: Secondary | ICD-10-CM | POA: Diagnosis not present

## 2021-10-18 DIAGNOSIS — D631 Anemia in chronic kidney disease: Secondary | ICD-10-CM | POA: Diagnosis not present

## 2021-10-18 DIAGNOSIS — F01B11 Vascular dementia, moderate, with agitation: Secondary | ICD-10-CM | POA: Diagnosis not present

## 2021-11-03 ENCOUNTER — Encounter (HOSPITAL_COMMUNITY): Payer: Self-pay

## 2021-11-03 ENCOUNTER — Inpatient Hospital Stay (HOSPITAL_COMMUNITY)
Admission: EM | Admit: 2021-11-03 | Discharge: 2021-11-10 | DRG: 522 | Disposition: A | Discharge status: Skilled Nursing Facility | Payer: Medicare PPO | Attending: Internal Medicine | Admitting: Internal Medicine

## 2021-11-03 ENCOUNTER — Other Ambulatory Visit: Payer: Self-pay

## 2021-11-03 ENCOUNTER — Emergency Department (HOSPITAL_COMMUNITY): Payer: Medicare PPO

## 2021-11-03 DIAGNOSIS — Z8673 Personal history of transient ischemic attack (TIA), and cerebral infarction without residual deficits: Secondary | ICD-10-CM

## 2021-11-03 DIAGNOSIS — J90 Pleural effusion, not elsewhere classified: Secondary | ICD-10-CM | POA: Diagnosis not present

## 2021-11-03 DIAGNOSIS — N179 Acute kidney failure, unspecified: Secondary | ICD-10-CM | POA: Diagnosis not present

## 2021-11-03 DIAGNOSIS — Y92019 Unspecified place in single-family (private) house as the place of occurrence of the external cause: Secondary | ICD-10-CM | POA: Diagnosis not present

## 2021-11-03 DIAGNOSIS — S72011A Unspecified intracapsular fracture of right femur, initial encounter for closed fracture: Secondary | ICD-10-CM | POA: Diagnosis not present

## 2021-11-03 DIAGNOSIS — D631 Anemia in chronic kidney disease: Secondary | ICD-10-CM | POA: Diagnosis not present

## 2021-11-03 DIAGNOSIS — M47812 Spondylosis without myelopathy or radiculopathy, cervical region: Secondary | ICD-10-CM | POA: Diagnosis not present

## 2021-11-03 DIAGNOSIS — I517 Cardiomegaly: Secondary | ICD-10-CM | POA: Diagnosis not present

## 2021-11-03 DIAGNOSIS — D62 Acute posthemorrhagic anemia: Secondary | ICD-10-CM | POA: Diagnosis not present

## 2021-11-03 DIAGNOSIS — Z87898 Personal history of other specified conditions: Secondary | ICD-10-CM

## 2021-11-03 DIAGNOSIS — W19XXXA Unspecified fall, initial encounter: Secondary | ICD-10-CM | POA: Diagnosis not present

## 2021-11-03 DIAGNOSIS — E782 Mixed hyperlipidemia: Secondary | ICD-10-CM | POA: Diagnosis present

## 2021-11-03 DIAGNOSIS — S0990XA Unspecified injury of head, initial encounter: Secondary | ICD-10-CM | POA: Diagnosis not present

## 2021-11-03 DIAGNOSIS — Z96641 Presence of right artificial hip joint: Secondary | ICD-10-CM | POA: Diagnosis not present

## 2021-11-03 DIAGNOSIS — S0101XA Laceration without foreign body of scalp, initial encounter: Secondary | ICD-10-CM | POA: Diagnosis not present

## 2021-11-03 DIAGNOSIS — M810 Age-related osteoporosis without current pathological fracture: Secondary | ICD-10-CM | POA: Diagnosis present

## 2021-11-03 DIAGNOSIS — I129 Hypertensive chronic kidney disease with stage 1 through stage 4 chronic kidney disease, or unspecified chronic kidney disease: Secondary | ICD-10-CM | POA: Diagnosis present

## 2021-11-03 DIAGNOSIS — F01B11 Vascular dementia, moderate, with agitation: Secondary | ICD-10-CM | POA: Insufficient documentation

## 2021-11-03 DIAGNOSIS — Z809 Family history of malignant neoplasm, unspecified: Secondary | ICD-10-CM

## 2021-11-03 DIAGNOSIS — E440 Moderate protein-calorie malnutrition: Secondary | ICD-10-CM | POA: Diagnosis not present

## 2021-11-03 DIAGNOSIS — Z9889 Other specified postprocedural states: Secondary | ICD-10-CM

## 2021-11-03 DIAGNOSIS — Z96642 Presence of left artificial hip joint: Secondary | ICD-10-CM | POA: Diagnosis present

## 2021-11-03 DIAGNOSIS — E871 Hypo-osmolality and hyponatremia: Secondary | ICD-10-CM | POA: Diagnosis not present

## 2021-11-03 DIAGNOSIS — W1830XA Fall on same level, unspecified, initial encounter: Secondary | ICD-10-CM | POA: Diagnosis present

## 2021-11-03 DIAGNOSIS — E785 Hyperlipidemia, unspecified: Secondary | ICD-10-CM | POA: Diagnosis present

## 2021-11-03 DIAGNOSIS — J9811 Atelectasis: Secondary | ICD-10-CM | POA: Diagnosis not present

## 2021-11-03 DIAGNOSIS — M79604 Pain in right leg: Secondary | ICD-10-CM | POA: Diagnosis not present

## 2021-11-03 DIAGNOSIS — K5900 Constipation, unspecified: Secondary | ICD-10-CM | POA: Diagnosis present

## 2021-11-03 DIAGNOSIS — R569 Unspecified convulsions: Secondary | ICD-10-CM | POA: Diagnosis present

## 2021-11-03 DIAGNOSIS — Z86718 Personal history of other venous thrombosis and embolism: Secondary | ICD-10-CM | POA: Diagnosis not present

## 2021-11-03 DIAGNOSIS — Z6820 Body mass index (BMI) 20.0-20.9, adult: Secondary | ICD-10-CM | POA: Diagnosis not present

## 2021-11-03 DIAGNOSIS — F0150 Vascular dementia without behavioral disturbance: Secondary | ICD-10-CM | POA: Diagnosis not present

## 2021-11-03 DIAGNOSIS — S72001D Fracture of unspecified part of neck of right femur, subsequent encounter for closed fracture with routine healing: Secondary | ICD-10-CM | POA: Diagnosis not present

## 2021-11-03 DIAGNOSIS — Z9181 History of falling: Secondary | ICD-10-CM | POA: Diagnosis not present

## 2021-11-03 DIAGNOSIS — I10 Essential (primary) hypertension: Secondary | ICD-10-CM | POA: Diagnosis not present

## 2021-11-03 DIAGNOSIS — N184 Chronic kidney disease, stage 4 (severe): Secondary | ICD-10-CM | POA: Diagnosis not present

## 2021-11-03 DIAGNOSIS — Z79899 Other long term (current) drug therapy: Secondary | ICD-10-CM

## 2021-11-03 DIAGNOSIS — Z471 Aftercare following joint replacement surgery: Secondary | ICD-10-CM | POA: Diagnosis not present

## 2021-11-03 DIAGNOSIS — R0602 Shortness of breath: Secondary | ICD-10-CM | POA: Diagnosis not present

## 2021-11-03 DIAGNOSIS — I639 Cerebral infarction, unspecified: Secondary | ICD-10-CM | POA: Diagnosis not present

## 2021-11-03 DIAGNOSIS — Z7901 Long term (current) use of anticoagulants: Secondary | ICD-10-CM | POA: Diagnosis not present

## 2021-11-03 DIAGNOSIS — S199XXA Unspecified injury of neck, initial encounter: Secondary | ICD-10-CM | POA: Diagnosis not present

## 2021-11-03 DIAGNOSIS — Z043 Encounter for examination and observation following other accident: Secondary | ICD-10-CM | POA: Diagnosis not present

## 2021-11-03 DIAGNOSIS — M1711 Unilateral primary osteoarthritis, right knee: Secondary | ICD-10-CM | POA: Diagnosis not present

## 2021-11-03 DIAGNOSIS — R9082 White matter disease, unspecified: Secondary | ICD-10-CM | POA: Diagnosis not present

## 2021-11-03 DIAGNOSIS — M25552 Pain in left hip: Secondary | ICD-10-CM | POA: Diagnosis not present

## 2021-11-03 DIAGNOSIS — Z7401 Bed confinement status: Secondary | ICD-10-CM | POA: Diagnosis not present

## 2021-11-03 DIAGNOSIS — Z66 Do not resuscitate: Secondary | ICD-10-CM | POA: Diagnosis not present

## 2021-11-03 DIAGNOSIS — S72001A Fracture of unspecified part of neck of right femur, initial encounter for closed fracture: Secondary | ICD-10-CM | POA: Diagnosis present

## 2021-11-03 DIAGNOSIS — D72829 Elevated white blood cell count, unspecified: Secondary | ICD-10-CM | POA: Diagnosis not present

## 2021-11-03 DIAGNOSIS — I6523 Occlusion and stenosis of bilateral carotid arteries: Secondary | ICD-10-CM | POA: Diagnosis not present

## 2021-11-03 DIAGNOSIS — I82492 Acute embolism and thrombosis of other specified deep vein of left lower extremity: Secondary | ICD-10-CM | POA: Diagnosis not present

## 2021-11-03 DIAGNOSIS — D649 Anemia, unspecified: Secondary | ICD-10-CM | POA: Diagnosis not present

## 2021-11-03 DIAGNOSIS — M25551 Pain in right hip: Secondary | ICD-10-CM | POA: Diagnosis not present

## 2021-11-03 DIAGNOSIS — R531 Weakness: Secondary | ICD-10-CM | POA: Diagnosis not present

## 2021-11-03 LAB — CBC WITH DIFFERENTIAL/PLATELET
Abs Immature Granulocytes: 0.06 10*3/uL (ref 0.00–0.07)
Basophils Absolute: 0 10*3/uL (ref 0.0–0.1)
Basophils Relative: 0 %
Eosinophils Absolute: 0.1 10*3/uL (ref 0.0–0.5)
Eosinophils Relative: 1 %
HCT: 29.5 % — ABNORMAL LOW (ref 36.0–46.0)
Hemoglobin: 9.3 g/dL — ABNORMAL LOW (ref 12.0–15.0)
Immature Granulocytes: 1 %
Lymphocytes Relative: 24 %
Lymphs Abs: 2.5 10*3/uL (ref 0.7–4.0)
MCH: 27.6 pg (ref 26.0–34.0)
MCHC: 31.5 g/dL (ref 30.0–36.0)
MCV: 87.5 fL (ref 80.0–100.0)
Monocytes Absolute: 0.8 10*3/uL (ref 0.1–1.0)
Monocytes Relative: 7 %
Neutro Abs: 6.7 10*3/uL (ref 1.7–7.7)
Neutrophils Relative %: 67 %
Platelets: 436 10*3/uL — ABNORMAL HIGH (ref 150–400)
RBC: 3.37 MIL/uL — ABNORMAL LOW (ref 3.87–5.11)
RDW: 14.2 % (ref 11.5–15.5)
WBC: 10.2 10*3/uL (ref 4.0–10.5)
nRBC: 0 % (ref 0.0–0.2)

## 2021-11-03 LAB — PROTIME-INR
INR: 1.5 — ABNORMAL HIGH (ref 0.8–1.2)
Prothrombin Time: 17.7 seconds — ABNORMAL HIGH (ref 11.4–15.2)

## 2021-11-03 LAB — COMPREHENSIVE METABOLIC PANEL
ALT: 17 U/L (ref 0–44)
AST: 21 U/L (ref 15–41)
Albumin: 3.3 g/dL — ABNORMAL LOW (ref 3.5–5.0)
Alkaline Phosphatase: 111 U/L (ref 38–126)
Anion gap: 7 (ref 5–15)
BUN: 25 mg/dL — ABNORMAL HIGH (ref 8–23)
CO2: 21 mmol/L — ABNORMAL LOW (ref 22–32)
Calcium: 9 mg/dL (ref 8.9–10.3)
Chloride: 112 mmol/L — ABNORMAL HIGH (ref 98–111)
Creatinine, Ser: 1.98 mg/dL — ABNORMAL HIGH (ref 0.44–1.00)
GFR, Estimated: 22 mL/min — ABNORMAL LOW (ref >60)
Glucose, Bld: 115 mg/dL — ABNORMAL HIGH (ref 70–99)
Potassium: 4.2 mmol/L (ref 3.5–5.1)
Sodium: 140 mmol/L (ref 135–145)
Total Bilirubin: 0.6 mg/dL (ref 0.3–1.2)
Total Protein: 7.3 g/dL (ref 6.5–8.1)

## 2021-11-03 LAB — HEPARIN LEVEL (UNFRACTIONATED): Heparin Unfractionated: >1.1 IU/mL — ABNORMAL HIGH (ref 0.30–0.70)

## 2021-11-03 LAB — APTT: aPTT: 30 seconds (ref 24–36)

## 2021-11-03 MED ORDER — ATORVASTATIN CALCIUM 20 MG PO TABS
20.0000 mg | ORAL_TABLET | Freq: Every day | ORAL | Status: DC
  Administered 2021-11-03 – 2021-11-09 (×7): 20 mg via ORAL
  Filled 2021-11-03 (×4): qty 1
  Filled 2021-11-03: qty 2
  Filled 2021-11-03 (×2): qty 1

## 2021-11-03 MED ORDER — HEPARIN (PORCINE) 25000 UT/250ML-% IV SOLN
750.0000 [IU]/h | INTRAVENOUS | Status: DC
  Administered 2021-11-04: 650 [IU]/h via INTRAVENOUS
  Filled 2021-11-03: qty 250

## 2021-11-03 MED ORDER — LEVETIRACETAM 250 MG PO TABS
250.0000 mg | ORAL_TABLET | Freq: Every day | ORAL | Status: DC
  Administered 2021-11-04 – 2021-11-10 (×7): 250 mg via ORAL
  Filled 2021-11-03 (×7): qty 1

## 2021-11-03 MED ORDER — PANTOPRAZOLE SODIUM 40 MG PO TBEC
40.0000 mg | DELAYED_RELEASE_TABLET | Freq: Every day | ORAL | Status: DC
  Administered 2021-11-04 – 2021-11-05 (×2): 40 mg via ORAL
  Filled 2021-11-03 (×3): qty 1

## 2021-11-03 MED ORDER — FENTANYL CITRATE PF 50 MCG/ML IJ SOSY
25.0000 ug | PREFILLED_SYRINGE | Freq: Once | INTRAMUSCULAR | Status: AC
  Administered 2021-11-03: 25 ug via INTRAVENOUS
  Filled 2021-11-03: qty 1

## 2021-11-03 MED ORDER — SODIUM CHLORIDE 0.9 % IV SOLN
INTRAVENOUS | Status: DC
Start: 2021-11-03 — End: 2021-11-04

## 2021-11-03 MED ORDER — MEMANTINE HCL 10 MG PO TABS: 10.0000 mg | ORAL_TABLET | Freq: Two times a day (BID) | ORAL | Status: DC

## 2021-11-03 MED ORDER — MEMANTINE HCL 10 MG PO TABS
5.0000 mg | ORAL_TABLET | Freq: Two times a day (BID) | ORAL | Status: DC
  Administered 2021-11-03 – 2021-11-10 (×14): 5 mg via ORAL
  Filled 2021-11-03 (×14): qty 1

## 2021-11-03 MED ORDER — HYDROCODONE-ACETAMINOPHEN 5-325 MG PO TABS: 1.0000 | ORAL_TABLET | Freq: Four times a day (QID) | ORAL | Status: DC | PRN

## 2021-11-03 MED ORDER — MORPHINE SULFATE (PF) 2 MG/ML IV SOLN
0.5000 mg | INTRAVENOUS | Status: DC | PRN
  Administered 2021-11-05 – 2021-11-09 (×2): 0.5 mg via INTRAVENOUS
  Filled 2021-11-03 (×2): qty 1

## 2021-11-03 MED ORDER — HYDRALAZINE HCL 20 MG/ML IJ SOLN: 5.0000 mg | Freq: Four times a day (QID) | INTRAMUSCULAR | Status: DC | PRN

## 2021-11-03 MED ORDER — ALLOPURINOL 100 MG PO TABS
200.0000 mg | ORAL_TABLET | Freq: Every morning | ORAL | Status: DC
  Administered 2021-11-04 – 2021-11-10 (×6): 200 mg via ORAL
  Filled 2021-11-03 (×7): qty 2

## 2021-11-03 MED ORDER — FENTANYL CITRATE PF 50 MCG/ML IJ SOSY: 50.0000 ug | PREFILLED_SYRINGE | Freq: Once | INTRAMUSCULAR | Status: DC

## 2021-11-03 NOTE — ED Notes (Signed)
[facility] called for transport. 

## 2021-11-03 NOTE — Assessment & Plan Note (Signed)
Related to fall.  Wound care

## 2021-11-03 NOTE — ED Notes (Signed)
Offered apple sauce or food, pt declined

## 2021-11-03 NOTE — Progress Notes (Signed)
ANTICOAGULATION CONSULT NOTE - Initial Consult  Pharmacy Consult for Heparin Indication:  bridging for procedudre, hip repair  No Known Allergies  Patient Measurements: Height: 4\' 9"  (144.8 cm) Weight: 42 kg (92 lb 9.5 oz) IBW/kg (Calculated) : 38.6 Heparin Dosing Weight: 42 kg  Vital Signs: Temp: 98.2 F (36.8 C) (07/30 2013) Temp Source: Temporal (07/30 2013) BP: 116/99 (07/30 2115) Pulse Rate: 101 (07/30 2115)  Labs: Recent Labs    11/03/21 1608  HGB 9.3*  HCT 29.5*  PLT 436*  CREATININE 1.98*    Estimated Creatinine Clearance: 9.2 mL/min (A) (by C-G formula based on SCr of 1.98 mg/dL (H)).   Medical History: Past Medical History:  Diagnosis Date   Acute stroke due to occlusion of left posterior cerebral artery ([place]) 01/30/2021   Anemia due to stage 4 chronic kidney disease ([place]) 01/30/2021   Cerebral infarction due to carotid artery occlusion ([place]) 09/16/2021   Chronic kidney disease, stage 4 (severe) ([place]) 01/30/2021   Essential hypertension 01/30/2021   History of gout 09/16/2021   History of Right Occipital stroke ([place]) 09/17/2021   Left homonymous hemianopsia 09/17/2021   History of right occipital stroke 10/2020   Mixed hyperlipidemia 01/30/2021   Occipital stroke ([place]) 2022   Osteoporosis 09/17/2021    Medications:  (Not in a hospital admission)  Scheduled:   [START ON 11/04/2021] allopurinol  200 mg Oral q AM   atorvastatin  20 mg Oral Daily   [START ON 11/04/2021] levETIRAcetam  250 mg Oral Daily   memantine  5 mg Oral BID   [START ON 11/04/2021] pantoprazole  40 mg Oral QAC breakfast   Infusions:   sodium chloride     PRN: hydrALAZINE, HYDROcodone-acetaminophen, morphine injection  Assessment: 89 yof with a history of CKD, HTN, lt femoral vein 6/15 taken off DAPT and started on eliquis. Patient is presenting with fall. Heparin per pharmacy consult placed for  bridging for procedudre, hip repair .  Patient is on apixaban prior to arrival. Last  dose 7/30 12:30. Will require aPTT monitoring due to likely falsely high anti-Xa level secondary to DOAC use.  Hgb 9.3; plt 436  Goal of Therapy:  Heparin level 0.3-0.7 units/ml aPTT 66-102 seconds Monitor platelets by anticoagulation protocol: Yes   Plan:  No initial heparin bolus Start heparin infusion at 650 units/hr at [place] 7/31 Check aPTT & anti-Xa level at 0830 7/31 and daily while on heparin Continue to monitor via aPTT until levels are correlated Continue to monitor H&H and platelets  Lorelei Pont, PharmD, BCPS 11/03/2021 9:42 PM ED Clinical Pharmacist -  [phone]

## 2021-11-03 NOTE — Assessment & Plan Note (Signed)
According to niece, patient was walking without her walker when she tried to pick something up and fell backwards hitting her head.  There was no loss of consciousness.  Patient remained awake and alert and was able to call her niece after the fall No evidence of injury beyond right hip fracture Neurologic checks

## 2021-11-03 NOTE — Assessment & Plan Note (Signed)
Continue atorvastatin

## 2021-11-03 NOTE — Progress Notes (Signed)
Orthopedic Tech Progress Note Patient Details:  Tracy [name] 12-08-1919 [id number]  Patient ID: [name], female   DOB: 1919/04/24, 88 y.o.   MRN: [id number] Level 2 trauma ortho visit. Cristobal Goldmann 11/03/2021, 4:05 PM

## 2021-11-03 NOTE — ED Provider Notes (Signed)
[facility] EMERGENCY DEPARTMENT Provider Note   CSN: [id number] Arrival date & time: 11/03/21  1600     History {Add pertinent medical, surgical, social history, OB history to HPI:1} Chief Complaint  Patient presents with   level 2/fall    Tracy [name] is a 88 y.o. female.  HPI     88 year old female with a history of CVA due to occlusion of left posterior cerebral artery, CKD stage IV, hypertension, hyperlipidemia, seizure after her stroke, displaced fracture of the left femoral neck that is post left hemiarthroplasty September 16, 2021, DVT on Eliquis twice daily for 3 months following June 15 diagnosis, who presents with concern for fall.  Past Medical History:  Diagnosis Date   Acute stroke due to occlusion of left posterior cerebral artery ([place]) 01/30/2021   Anemia due to stage 4 chronic kidney disease ([place]) 01/30/2021   Cerebral infarction due to carotid artery occlusion ([place]) 09/16/2021   Chronic kidney disease, stage 4 (severe) ([place]) 01/30/2021   Essential hypertension 01/30/2021   History of gout 09/16/2021   History of Right Occipital stroke ([place]) 09/17/2021   Left homonymous hemianopsia 09/17/2021   History of right occipital stroke 10/2020   Mixed hyperlipidemia 01/30/2021   Occipital stroke ([place]) 2022   Osteoporosis 09/17/2021      Home Medications Prior to Admission medications   Medication Sig Start Date End Date Taking? Authorizing Provider  acetaminophen (TYLENOL) 325 MG tablet Take 2 tablets (650 mg total) by mouth every 6 (six) hours as needed for mild pain. 09/24/21   Alcus Dad, MD  allopurinol (ZYLOPRIM) 100 MG tablet Take 200 mg by mouth in the morning.    [provider]  amLODipine (NORVASC) 10 MG tablet Take 1 tablet (10 mg total) by mouth daily. 09/24/21   Alcus Dad, MD  apixaban (ELIQUIS) 5 MG TABS tablet Take 1 tablet (5 mg total) by mouth 2 (two) times daily. 09/20/21   Hill, Marciano Sequin, PA-C  atorvastatin  (LIPITOR) 20 MG tablet Take 20 mg by mouth daily.    [provider]  calcium carbonate (OS-CAL - DOSED IN MG OF ELEMENTAL CALCIUM) 1250 (500 Ca) MG tablet Take 1 tablet (500 mg of elemental calcium total) by mouth daily with breakfast. 09/25/21   Alcus Dad, MD  cholecalciferol (VITAMIN D) 25 MCG tablet Take 1 tablet (1,000 Units total) by mouth daily. 09/25/21   Alcus Dad, MD  Fiber Select Gummies CHEW Chew 1-2 tablets by mouth daily as needed (for mild constipation symptoms).    [provider]  levETIRAcetam (KEPPRA) 250 MG tablet Take 1 tablet (250 mg total) by mouth daily. 09/25/21   Alcus Dad, MD  memantine (NAMENDA) 10 MG tablet TAKE 1 TABLET BY MOUTH TWICE A DAY Patient taking differently: Take 10 mg by mouth in the morning. 08/27/21   Frann Rider, NP  pantoprazole (PROTONIX) 40 MG tablet Take 40 mg by mouth daily before breakfast.    [provider]  senna-docusate (SENOKOT-S) 8.6-50 MG tablet Take 1 tablet by mouth 2 (two) times daily. 09/24/21   Alcus Dad, MD      Allergies    Patient has no known allergies.    Review of Systems   Review of Systems  Physical Exam Updated Vital Signs BP (!) 157/75   Pulse (!) 101   Temp 97.6 F (36.4 C) (Oral)   Resp (!) 25   Ht 4\' 9"  (1.448 m)   Wt 42 kg   SpO2 96%  BMI 20.04 kg/m  Physical Exam  ED Results / Procedures / Treatments   Labs (all labs ordered are listed, but only abnormal results are displayed) Labs Reviewed  COMPREHENSIVE METABOLIC PANEL - Abnormal; Notable for the following components:      Result Value   Chloride 112 (*)    CO2 21 (*)    Glucose, Bld 115 (*)    BUN 25 (*)    Creatinine, Ser 1.98 (*)    Albumin 3.3 (*)    GFR, Estimated 22 (*)    All other components within normal limits  CBC WITH DIFFERENTIAL/PLATELET - Abnormal; Notable for the following components:   RBC 3.37 (*)    Hemoglobin 9.3 (*)    HCT 29.5 (*)    Platelets 436 (*)    All other  components within normal limits    EKG None  Radiology DG Hip Unilat W or Wo Pelvis 2-3 Views Right  Result Date: 11/03/2021 CLINICAL DATA:  Fall, right hip pain EXAM: DG HIP (WITH OR WITHOUT PELVIS) 2-3V RIGHT COMPARISON:  02/01/2021 FINDINGS: Bones are mildly demineralized. No evidence of acute fracture. No dislocation. Moderate degenerative changes of the right hip. Prior left hip arthroplasty. No evidence of pelvic diastasis. IMPRESSION: Negative. Electronically Signed   By: [name] D.O.   On: 11/03/2021 16:52   DG Chest Portable 1 View  Result Date: 11/03/2021 CLINICAL DATA:  Fall. EXAM: PORTABLE CHEST 1 VIEW COMPARISON:  Chest x-ray 09/16/2021 FINDINGS: Patient rotation technically limits the study. The aorta is tortuous and the heart is mildly enlarged, unchanged. There is no focal lung infiltrate. Is no pleural effusion or pneumothorax. No acute fractures. IMPRESSION: 1. Limited examination.  No acute cardiopulmonary process. 2. Stable cardiomegaly. Electronically Signed   By: [name] M.D.   On: 11/03/2021 16:51    Procedures Procedures  {Document cardiac monitor, telemetry assessment procedure when appropriate:1}  Medications Ordered in ED Medications  fentaNYL (SUBLIMAZE) injection 25 mcg (25 mcg Intravenous Given 11/03/21 1841)    ED Course/ Medical Decision Making/ A&P                           Medical Decision Making Amount and/or Complexity of Data Reviewed Labs: ordered. Radiology: ordered.  Risk Prescription drug management.   ***   CT of the right hip shows acute nondisplaced subcapital right femoral neck fracture.  CT that of the head and cervical spine show no evidence of acute abnormalities. {Document critical care time when appropriate:1} {Document review of labs and clinical decision tools ie heart score, Chads2Vasc2 etc:1}  {Document your independent review of radiology images, and any outside records:1} {Document your discussion with  family members, caretakers, and with consultants:1} {Document social determinants of health affecting pt's care:1} {Document your decision making why or why not admission, treatments were needed:1} Final Clinical Impression(s) / ED Diagnoses Final diagnoses:  None    Rx / DC Orders ED Discharge Orders     None

## 2021-11-03 NOTE — Assessment & Plan Note (Addendum)
Patient is currently on Eliquis and needs to undergo right hip repair Given recent DVT will place patient on heparin bridge to replace Eliquis

## 2021-11-03 NOTE — Assessment & Plan Note (Signed)
Blood pressure controlled Hydralazine IV as needed BP while n.p.o. to keep SBP under 160 and resume oral as appropriate

## 2021-11-03 NOTE — ED Notes (Addendum)
Niece at bedside verified last dose of Eliquis was at 12:30pm today

## 2021-11-03 NOTE — H&P (View-Only) (Signed)
Reason for Consult:Right hip fracture Referring Physician: EDP  Tracy [name] is an 88 y.o. female.  HPI: 88 yo household ambulator who presents with right hip pain after mechanical fall. Patient complained of immediate severe pain and was unable to stand after her fall. She denies any other injuries or pain. She did have a fall and broke her left hip in June requiring arthroplasty from Dr Tracy [name] ([facility].) Patient and family requesting Dr Tracy [name]  Past Medical History:  Diagnosis Date   Acute stroke due to occlusion of left posterior cerebral artery ([name]) 01/30/2021   Anemia due to stage 4 chronic kidney disease ([name]) 01/30/2021   Cerebral infarction due to carotid artery occlusion ([place]) 09/16/2021   Chronic kidney disease, stage 4 (severe) ([name]) 01/30/2021   Essential hypertension 01/30/2021   History of gout 09/16/2021   History of Right Occipital stroke ([name]) 09/17/2021   Left homonymous hemianopsia 09/17/2021   History of right occipital stroke 10/2020   Mixed hyperlipidemia 01/30/2021   Occipital stroke ([place]) 2022   Osteoporosis 09/17/2021    Past Surgical History:  Procedure Laterality Date   ANTERIOR APPROACH HEMI HIP ARTHROPLASTY Left 09/16/2021   Procedure: ANTERIOR APPROACH HEMI HIP ARTHROPLASTY;  Surgeon: Rod Can, MD;  Location: [facility];  Service: Orthopedics;  Laterality: Left;    Family History  Problem Relation Age of Onset   Cancer Mother    Dementia Sister    Heart disease Neg Hx     Social History:  reports that she has never smoked. She has never used smokeless tobacco. She reports current alcohol use. She reports that she does not use drugs.  Allergies: No Known Allergies  Medications: I have reviewed the patient's current medications.  Results for orders placed or performed during the hospital encounter of 11/03/21 (from the past 48 hour(s))  Comprehensive metabolic panel     Status: Abnormal   Collection Time: 11/03/21  4:08 PM  Result  Value Ref Range   Sodium 140 135 - 145 mmol/L   Potassium 4.2 3.5 - 5.1 mmol/L   Chloride 112 (H) 98 - 111 mmol/L   CO2 21 (L) 22 - 32 mmol/L   Glucose, Bld 115 (H) 70 - 99 mg/dL    Comment: Glucose reference range applies only to samples taken after fasting for at least 8 hours.   BUN 25 (H) 8 - 23 mg/dL   Creatinine, Ser 1.98 (H) 0.44 - 1.00 mg/dL   Calcium 9.0 8.9 - 10.3 mg/dL   Total Protein 7.3 6.5 - 8.1 g/dL   Albumin 3.3 (L) 3.5 - 5.0 g/dL   AST 21 15 - 41 U/L   ALT 17 0 - 44 U/L   Alkaline Phosphatase 111 38 - 126 U/L   Total Bilirubin 0.6 0.3 - 1.2 mg/dL   GFR, Estimated 22 (L) >60 mL/min    Comment: (NOTE) Calculated using the CKD-EPI Creatinine Equation (2021)    Anion gap 7 5 - 15    Comment: Performed at [facility] [address] [place], [place] [address]  CBC with Differential     Status: Abnormal   Collection Time: 11/03/21  4:08 PM  Result Value Ref Range   WBC 10.2 4.0 - 10.5 K/uL   RBC 3.37 (L) 3.87 - 5.11 MIL/uL   Hemoglobin 9.3 (L) 12.0 - 15.0 g/dL   HCT 29.5 (L) 36.0 - 46.0 %   MCV 87.5 80.0 - 100.0 fL   MCH 27.6 26.0 - 34.0 pg  MCHC 31.5 30.0 - 36.0 g/dL   RDW 14.2 11.5 - 15.5 %   Platelets 436 (H) 150 - 400 K/uL   nRBC 0.0 0.0 - 0.2 %   Neutrophils Relative % 67 %   Neutro Abs 6.7 1.7 - 7.7 K/uL   Lymphocytes Relative 24 %   Lymphs Abs 2.5 0.7 - 4.0 K/uL   Monocytes Relative 7 %   Monocytes Absolute 0.8 0.1 - 1.0 K/uL   Eosinophils Relative 1 %   Eosinophils Absolute 0.1 0.0 - 0.5 K/uL   Basophils Relative 0 %   Basophils Absolute 0.0 0.0 - 0.1 K/uL   Immature Granulocytes 1 %   Abs Immature Granulocytes 0.06 0.00 - 0.07 K/uL    Comment: Performed at [facility] [address] [place],  [address]    DG Hip Tracy [name] or [name] 2-3 Views Right  Result Date: 11/03/2021 CLINICAL DATA:  Fall, right hip pain EXAM: DG HIP (WITH OR WITHOUT PELVIS) 2-3V RIGHT COMPARISON:  02/01/2021 FINDINGS: Bones are mildly  demineralized. No evidence of acute fracture. No dislocation. Moderate degenerative changes of the right hip. Prior left hip arthroplasty. No evidence of pelvic diastasis. IMPRESSION: Negative. Electronically Signed   By: [name] D.O.   On: 11/03/2021 16:52   DG Chest Portable 1 View  Result Date: 11/03/2021 CLINICAL DATA:  Fall. EXAM: PORTABLE CHEST 1 VIEW COMPARISON:  Chest x-ray 09/16/2021 FINDINGS: Patient rotation technically limits the study. The aorta is tortuous and the heart is mildly enlarged, unchanged. There is no focal lung infiltrate. Is no pleural effusion or pneumothorax. No acute fractures. IMPRESSION: 1. Limited examination.  No acute cardiopulmonary process. 2. Stable cardiomegaly. Electronically Signed   By: [name] M.D.   On: 11/03/2021 16:51    Review of Systems Blood pressure (!) 150/69, pulse 100, temperature 97.6 F (36.4 C), temperature source Oral, resp. rate (!) 21, height 4\' 9"  (1.448 m), weight 42 kg, SpO2 100 %. Physical Exam Awake and alert in moderate distress in the trauma bay. Neck and back nontender with no bony stepoffs Bilateral UEs with full AROM and normal strength and no tenderness and no pain with movement. NVI Right LE with pain with attempted ROM. Knee and ankle nontender, no deformity. Distally NVI Left LE pain free PROM, no tenderness  Right hip minimally displaced femoral neck fracture by XRAY and CT scan.   Assessment/Plan: Displaced right femoral neck fracture. Will require arthroplasty.  Patient on Eliquis. Will hold this for now pending surgery, Surgical timing and plan per Dr Tracy [name] who has requested admission to [facility].  Appreciate medical optimization per hospitalist service.   Tracy [name] 11/03/2021, 7:51 PM

## 2021-11-03 NOTE — Consult Note (Signed)
Reason for Consult:Right hip fracture Referring Physician: EDP  Tracy [name] is an 88 y.o. female.  HPI: 88 yo household ambulator who presents with right hip pain after mechanical fall. Patient complained of immediate severe pain and was unable to stand after her fall. She denies any other injuries or pain. She did have a fall and broke her left hip in June requiring arthroplasty from Dr Lyla Glassing ([facility].) Patient and family requesting Dr Lyla Glassing  Past Medical History:  Diagnosis Date   Acute stroke due to occlusion of left posterior cerebral artery ([place]) 01/30/2021   Anemia due to stage 4 chronic kidney disease ([place]) 01/30/2021   Cerebral infarction due to carotid artery occlusion ([place]) 09/16/2021   Chronic kidney disease, stage 4 (severe) ([place]) 01/30/2021   Essential hypertension 01/30/2021   History of gout 09/16/2021   History of Right Occipital stroke ([place]) 09/17/2021   Left homonymous hemianopsia 09/17/2021   History of right occipital stroke 10/2020   Mixed hyperlipidemia 01/30/2021   Occipital stroke ([place]) 2022   Osteoporosis 09/17/2021    Past Surgical History:  Procedure Laterality Date   ANTERIOR APPROACH HEMI HIP ARTHROPLASTY Left 09/16/2021   Procedure: ANTERIOR APPROACH HEMI HIP ARTHROPLASTY;  Surgeon: Rod Can, MD;  Location: [facility];  Service: Orthopedics;  Laterality: Left;    Family History  Problem Relation Age of Onset   Cancer Mother    Dementia Sister    Heart disease Neg Hx     Social History:  reports that she has never smoked. She has never used smokeless tobacco. She reports current alcohol use. She reports that she does not use drugs.  Allergies: No Known Allergies  Medications: I have reviewed the patient's current medications.  Results for orders placed or performed during the hospital encounter of 11/03/21 (from the past 48 hour(s))  Comprehensive metabolic panel     Status: Abnormal   Collection Time: 11/03/21  4:08 PM  Result  Value Ref Range   Sodium 140 135 - 145 mmol/L   Potassium 4.2 3.5 - 5.1 mmol/L   Chloride 112 (H) 98 - 111 mmol/L   CO2 21 (L) 22 - 32 mmol/L   Glucose, Bld 115 (H) 70 - 99 mg/dL    Comment: Glucose reference range applies only to samples taken after fasting for at least 8 hours.   BUN 25 (H) 8 - 23 mg/dL   Creatinine, Ser 1.98 (H) 0.44 - 1.00 mg/dL   Calcium 9.0 8.9 - 10.3 mg/dL   Total Protein 7.3 6.5 - 8.1 g/dL   Albumin 3.3 (L) 3.5 - 5.0 g/dL   AST 21 15 - 41 U/L   ALT 17 0 - 44 U/L   Alkaline Phosphatase 111 38 - 126 U/L   Total Bilirubin 0.6 0.3 - 1.2 mg/dL   GFR, Estimated 22 (L) >60 mL/min    Comment: (NOTE) Calculated using the CKD-EPI Creatinine Equation (2021)    Anion gap 7 5 - 15    Comment: Performed at [facility] [address], [place], [place] [address]  CBC with Differential     Status: Abnormal   Collection Time: 11/03/21  4:08 PM  Result Value Ref Range   WBC 10.2 4.0 - 10.5 K/uL   RBC 3.37 (L) 3.87 - 5.11 MIL/uL   Hemoglobin 9.3 (L) 12.0 - 15.0 g/dL   HCT 29.5 (L) 36.0 - 46.0 %   MCV 87.5 80.0 - 100.0 fL   MCH 27.6 26.0 - 34.0 pg  MCHC 31.5 30.0 - 36.0 g/dL   RDW 14.2 11.5 - 15.5 %   Platelets 436 (H) 150 - 400 K/uL   nRBC 0.0 0.0 - 0.2 %   Neutrophils Relative % 67 %   Neutro Abs 6.7 1.7 - 7.7 K/uL   Lymphocytes Relative 24 %   Lymphs Abs 2.5 0.7 - 4.0 K/uL   Monocytes Relative 7 %   Monocytes Absolute 0.8 0.1 - 1.0 K/uL   Eosinophils Relative 1 %   Eosinophils Absolute 0.1 0.0 - 0.5 K/uL   Basophils Relative 0 %   Basophils Absolute 0.0 0.0 - 0.1 K/uL   Immature Granulocytes 1 %   Abs Immature Granulocytes 0.06 0.00 - 0.07 K/uL    Comment: Performed at [facility] [address] [place], [place] [address]    DG Hip [name] or [name] 2-3 Views Right  Result Date: 11/03/2021 CLINICAL DATA:  Fall, right hip pain EXAM: DG HIP (WITH OR WITHOUT PELVIS) 2-3V RIGHT COMPARISON:  02/01/2021 FINDINGS: Bones are mildly  demineralized. No evidence of acute fracture. No dislocation. Moderate degenerative changes of the right hip. Prior left hip arthroplasty. No evidence of pelvic diastasis. IMPRESSION: Negative. Electronically Signed   By: [name] D.O.   On: 11/03/2021 16:52   DG Chest Portable 1 View  Result Date: 11/03/2021 CLINICAL DATA:  Fall. EXAM: PORTABLE CHEST 1 VIEW COMPARISON:  Chest x-ray 09/16/2021 FINDINGS: Patient rotation technically limits the study. The aorta is tortuous and the heart is mildly enlarged, unchanged. There is no focal lung infiltrate. Is no pleural effusion or pneumothorax. No acute fractures. IMPRESSION: 1. Limited examination.  No acute cardiopulmonary process. 2. Stable cardiomegaly. Electronically Signed   By: [name] M.D.   On: 11/03/2021 16:51    Review of Systems Blood pressure (!) 150/69, pulse 100, temperature 97.6 F (36.4 C), temperature source Oral, resp. rate (!) 21, height 4\' 9"  (1.448 m), weight 42 kg, SpO2 100 %. Physical Exam Awake and alert in moderate distress in the trauma bay. Neck and back nontender with no bony stepoffs Bilateral UEs with full AROM and normal strength and no tenderness and no pain with movement. NVI Right LE with pain with attempted ROM. Knee and ankle nontender, no deformity. Distally NVI Left LE pain free PROM, no tenderness  Right hip minimally displaced femoral neck fracture by XRAY and CT scan.   Assessment/Plan: Displaced right femoral neck fracture. Will require arthroplasty.  Patient on Eliquis. Will hold this for now pending surgery, Surgical timing and plan per Dr Lyla Glassing who has requested admission to [facility].  Appreciate medical optimization per hospitalist service.   [name] 11/03/2021, 7:51 PM

## 2021-11-03 NOTE — ED Triage Notes (Signed)
Pt BIB [facility] for eval s/p mechanical fall striking her head. Pt anticoagulated on eliquis. Small lac to posterior scalp. Hx of dementia, confused at baseline

## 2021-11-03 NOTE — H&P (Signed)
History and Physical    Patient: Tracy [name] ZTI:[id number] DOB: 10-26-1919 DOA: 11/03/2021 DOS: the patient was seen and examined on 11/03/2021 PCP: Charlane Ferretti, MD  Patient coming from: Home  Chief Complaint:  Chief Complaint  Patient presents with   level 2/fall    HPI: Tracy [name] is a 88 y.o. female with medical history significant for CKD 4 with anemia of CKD, hypertension, recently hospitalized from 6/11 to 7/10 with a left femoral neck fracture undergoing left hemiarthroplasty on 6/12, and who had seizure-like activity on 8/25, with an embolic stroke seen on 0/53 and noted to have left femoral vein DVT on 6/15 for which she was taken off DAPT and placed on Eliquis, who returns to the ED following an accidental fall in which she fell backwards hitting her head, while trying to over to pick something up off the floor.  She did not lose consciousness but suffered a laceration to the occipital scalp.Marland Kitchen  Her niece who gives a history over the phone says that her aunt has been recovering well and had been doing well with physical therapy and was supposed to be using her walker and rollator and was able to climb steps successfully however on the day of the fall she was not using her walker.  She was in the care of her caregiver who described the incident.  Prior to the fall she was in her usual state of health.  Following the fall she had no loss of consciousness and was able to call her niece on the phone and appeared to be her usual self when the phone call was made.  Patient was awake and alert when seen.  Review of Systems: As mentioned in the history of present illness. All other systems reviewed and are negative.  Past Medical History:  Diagnosis Date   Acute stroke due to occlusion of left posterior cerebral artery ([place]) 01/30/2021   Anemia due to stage 4 chronic kidney disease ([place]) 01/30/2021   Cerebral infarction due to carotid artery occlusion ([place]) 09/16/2021   Chronic  kidney disease, stage 4 (severe) ([place]) 01/30/2021   Essential hypertension 01/30/2021   History of gout 09/16/2021   History of Right Occipital stroke ([place]) 09/17/2021   Left homonymous hemianopsia 09/17/2021   History of right occipital stroke 10/2020   Mixed hyperlipidemia 01/30/2021   Occipital stroke ([place]) 2022   Osteoporosis 09/17/2021   Past Surgical History:  Procedure Laterality Date   ANTERIOR APPROACH HEMI HIP ARTHROPLASTY Left 09/16/2021   Procedure: ANTERIOR APPROACH HEMI HIP ARTHROPLASTY;  Surgeon: Rod Can, MD;  Location: [facility];  Service: Orthopedics;  Laterality: Left;   Social History:  reports that she has never smoked. She has never used smokeless tobacco. She reports current alcohol use. She reports that she does not use drugs.  No Known Allergies  Family History  Problem Relation Age of Onset   Cancer Mother    Dementia Sister    Heart disease Neg Hx     Prior to Admission medications   Medication Sig Start Date End Date Taking? Authorizing Provider  acetaminophen (TYLENOL) 325 MG tablet Take 2 tablets (650 mg total) by mouth every 6 (six) hours as needed for mild pain. 09/24/21  Yes Alcus Dad, MD  allopurinol (ZYLOPRIM) 100 MG tablet Take 200 mg by mouth in the morning.   Yes [provider]  amLODipine (NORVASC) 10 MG tablet Take 1 tablet (10 mg total) by mouth daily. 09/24/21  Yes Alcus Dad,  MD  apixaban (ELIQUIS) 5 MG TABS tablet Take 1 tablet (5 mg total) by mouth 2 (two) times daily. 09/20/21  Yes Hill, Marciano Sequin, PA-C  atorvastatin (LIPITOR) 20 MG tablet Take 20 mg by mouth daily.   Yes [provider]  calcium carbonate (OS-CAL - DOSED IN MG OF ELEMENTAL CALCIUM) 1250 (500 Ca) MG tablet Take 1 tablet (500 mg of elemental calcium total) by mouth daily with breakfast. 09/25/21  Yes Alcus Dad, MD  cholecalciferol (VITAMIN D) 25 MCG tablet Take 1 tablet (1,000 Units total) by mouth daily. 09/25/21  Yes Alcus Dad, MD   Fiber Select Gummies CHEW Chew 1-2 tablets by mouth daily as needed (for mild constipation symptoms).   Yes [provider]  levETIRAcetam (KEPPRA) 250 MG tablet Take 1 tablet (250 mg total) by mouth daily. 09/25/21  Yes Alcus Dad, MD  memantine (NAMENDA) 5 MG tablet Take 5 mg by mouth 2 (two) times daily. 10/27/21  Yes [provider]  pantoprazole (PROTONIX) 40 MG tablet Take 40 mg by mouth daily before breakfast.   Yes [provider]  memantine (NAMENDA) 10 MG tablet TAKE 1 TABLET BY MOUTH TWICE A DAY Patient not taking: Reported on 11/03/2021 08/27/21   Frann Rider, NP    Physical Exam: Vitals:   11/03/21 1915 11/03/21 1945 11/03/21 2000 11/03/21 2013  BP: (!) 150/69 (!) 155/71 128/71   Pulse: 100 (!) 101 97   Resp: (!) 21 (!) 23 13   Temp:    98.2 F (36.8 C)  TempSrc:    Temporal  SpO2: 100% 100% 100%   Weight:      Height:       Physical Exam Vitals and nursing note reviewed.  Constitutional:      General: She is not in acute distress. HENT:     Head: Normocephalic and atraumatic.  Cardiovascular:     Rate and Rhythm: Regular rhythm. Tachycardia present.     Heart sounds: Normal heart sounds.  Pulmonary:     Effort: Pulmonary effort is normal.     Breath sounds: Normal breath sounds.  Abdominal:     Palpations: Abdomen is soft.     Tenderness: There is no abdominal tenderness.  Musculoskeletal:     Comments: Right leg shortening and external rotation  Neurological:     Mental Status: Mental status is at baseline.     Labs on Admission: I have personally reviewed following labs and imaging studies  CBC: Recent Labs  Lab 11/03/21 1608  WBC 10.2  NEUTROABS 6.7  HGB 9.3*  HCT 29.5*  MCV 87.5  PLT 409*   Basic Metabolic Panel: Recent Labs  Lab 11/03/21 1608  NA 140  K 4.2  CL 112*  CO2 21*  GLUCOSE 115*  BUN 25*  CREATININE 1.98*  CALCIUM 9.0   GFR: Estimated Creatinine Clearance: 9.2 mL/min (A) (by C-G formula  based on SCr of 1.98 mg/dL (H)). Liver Function Tests: Recent Labs  Lab 11/03/21 1608  AST 21  ALT 17  ALKPHOS 111  BILITOT 0.6  PROT 7.3  ALBUMIN 3.3*   No results for input(s): "LIPASE", "AMYLASE" in the last 168 hours. No results for input(s): "AMMONIA" in the last 168 hours. Coagulation Profile: No results for input(s): "INR", "PROTIME" in the last 168 hours. Cardiac Enzymes: No results for input(s): "CKTOTAL", "CKMB", "CKMBINDEX", "TROPONINI" in the last 168 hours. BNP (last 3 results) No results for input(s): "PROBNP" in the last 8760 hours. HbA1C: No results for  input(s): "HGBA1C" in the last 72 hours. CBG: No results for input(s): "GLUCAP" in the last 168 hours. Lipid Profile: No results for input(s): "CHOL", "HDL", "LDLCALC", "TRIG", "CHOLHDL", "LDLDIRECT" in the last 72 hours. Thyroid Function Tests: No results for input(s): "TSH", "T4TOTAL", "FREET4", "T3FREE", "THYROIDAB" in the last 72 hours. Anemia Panel: No results for input(s): "VITAMINB12", "FOLATE", "FERRITIN", "TIBC", "IRON", "RETICCTPCT" in the last 72 hours. Urine analysis:    Component Value Date/Time   COLORURINE YELLOW 09/22/2021 0415   APPEARANCEUR CLEAR 09/22/2021 0415   LABSPEC 1.015 09/22/2021 0415   PHURINE 5.0 09/22/2021 0415   GLUCOSEU NEGATIVE 09/22/2021 0415   HGBUR NEGATIVE 09/22/2021 0415   BILIRUBINUR NEGATIVE 09/22/2021 0415   KETONESUR NEGATIVE 09/22/2021 0415   PROTEINUR 100 (A) 09/22/2021 0415   UROBILINOGEN 0.2 02/27/2009 2304   NITRITE NEGATIVE 09/22/2021 0415   LEUKOCYTESUR NEGATIVE 09/22/2021 0415    Radiological Exams on Admission: DG Hip Unilat W or Wo Pelvis 2-3 Views Right  Result Date: 11/03/2021 CLINICAL DATA:  Fall, right hip pain EXAM: DG HIP (WITH OR WITHOUT PELVIS) 2-3V RIGHT COMPARISON:  02/01/2021 FINDINGS: Bones are mildly demineralized. No evidence of acute fracture. No dislocation. Moderate degenerative changes of the right hip. Prior left hip arthroplasty.  No evidence of pelvic diastasis. IMPRESSION: Negative. Electronically Signed   By: [name] D.O.   On: 11/03/2021 16:52   DG Chest Portable 1 View  Result Date: 11/03/2021 CLINICAL DATA:  Fall. EXAM: PORTABLE CHEST 1 VIEW COMPARISON:  Chest x-ray 09/16/2021 FINDINGS: Patient rotation technically limits the study. The aorta is tortuous and the heart is mildly enlarged, unchanged. There is no focal lung infiltrate. Is no pleural effusion or pneumothorax. No acute fractures. IMPRESSION: 1. Limited examination.  No acute cardiopulmonary process. 2. Stable cardiomegaly. Electronically Signed   By: [name] M.D.   On: 11/03/2021 16:51     Data Reviewed: Relevant notes from primary care and specialist visits, past discharge summaries as available in EHR, including Care Everywhere. Prior diagnostic testing as pertinent to current admission diagnoses Updated medications and problem lists for reconciliation ED course, including vitals, labs, imaging, treatment and response to treatment Triage notes, nursing and pharmacy notes and ED provider's notes Notable results as noted in HPI   Assessment and Plan: * Closed right hip fracture, initial encounter [facility]) History of left hip fracture 09/15/2021 Orthopedist, Dr. Lyla Glassing consulted and will take patient to the OR pending Eliquis washout Patient transferred to [facility] for admission due to OR availability Pain control N.p.o. after midnight Hold Eliquis.  Will place patient on heparin bridge due to history of recent DVT on 09/16/2021  History of femoral DVT 09/18/21 (deep vein thrombosis) Patient is currently on Eliquis and needs to undergo right hip repair Given recent DVT will place patient on heparin bridge to replace Eliquis  Essential hypertension Blood pressure controlled Hydralazine IV as needed BP while n.p.o. to keep SBP under 160 and resume oral as appropriate  Anemia due to stage 4 chronic kidney disease (HCC) Hemoglobin  at baseline at 9.3  Hyperlipidemia, unspecified Continue atorvastatin  History of repair of left hip joint June 2023 No acute issues suspected  History of seizure 09/16/21 Continue Depakote  History of embolic stroke 11/08/19 Patient was currently on Eliquis so not on DAPT in order to decrease bleeding risk Eliquis will be replaced with heparin bridge  Laceration of occipital scalp Related to fall.  Wound care  Chronic kidney disease, stage 4 (severe) (HCC) Renal function near  baseline with creatinine of 1.98, bicarb 21 and normal potassium  Vascular dementia ([place]) Patient is at baseline per conversation with daughter Delirium precautions  Accidental fall According to niece, patient was walking without her walker when she tried to pick something up and fell backwards hitting her head.  There was no loss of consciousness.  Patient remained awake and alert and was able to call her niece after the fall No evidence of injury beyond right hip fracture Neurologic checks         DVT prophylaxis: On heparin bridge  Consults: Orthopedist, Dr. Lyla Glassing  Advance Care Planning:   Code Status: Prior discussed with niece and patient is DNR  Family Communication: Spoke with niece  Disposition Plan: Back to previous home environment  Severity of Illness:  The appropriate patient status for this patient is INPATIENT. Inpatient status is judged to be reasonable and necessary in order to provide the required intensity of service to ensure the patient's safety. The patient's presenting symptoms, physical exam findings, and initial radiographic and laboratory data in the context of their chronic comorbidities is felt to place them at high risk for further clinical deterioration. Furthermore, it is not anticipated that the patient will be medically stable for discharge from the hospital within 2 midnights of admission.   * I certify that at the point of admission it is my clinical judgment  that the patient will require inpatient hospital care spanning beyond 2 midnights from the point of admission due to high intensity of service, high risk for further deterioration and high frequency of surveillance required.*  Author: Athena Masse, MD 11/03/2021 8:37 PM  For on call review [email or web address].

## 2021-11-03 NOTE — Assessment & Plan Note (Signed)
Continue Depakote. 

## 2021-11-03 NOTE — Assessment & Plan Note (Deleted)
No acute issues suspected 

## 2021-11-03 NOTE — Assessment & Plan Note (Signed)
Renal function near baseline with creatinine of 1.98, bicarb 21 and normal potassium

## 2021-11-03 NOTE — Assessment & Plan Note (Signed)
Patient is at baseline per conversation with daughter Delirium precautions

## 2021-11-03 NOTE — Assessment & Plan Note (Signed)
Hemoglobin at baseline at 9.3

## 2021-11-03 NOTE — Assessment & Plan Note (Addendum)
History of left hip fracture 09/15/2021 Orthopedist, Dr. Lyla Glassing consulted and will take patient to the OR pending Eliquis washout Patient transferred to [facility] for admission due to OR availability Pain control N.p.o. after midnight Hold Eliquis.  Will place patient on heparin bridge due to history of recent DVT on 09/16/2021

## 2021-11-03 NOTE — Assessment & Plan Note (Signed)
Patient was currently on Eliquis so not on DAPT in order to decrease bleeding risk Eliquis will be replaced with heparin bridge

## 2021-11-04 ENCOUNTER — Inpatient Hospital Stay (HOSPITAL_COMMUNITY): Payer: Medicare PPO

## 2021-11-04 DIAGNOSIS — Z8673 Personal history of transient ischemic attack (TIA), and cerebral infarction without residual deficits: Secondary | ICD-10-CM

## 2021-11-04 DIAGNOSIS — N184 Chronic kidney disease, stage 4 (severe): Secondary | ICD-10-CM

## 2021-11-04 DIAGNOSIS — E440 Moderate protein-calorie malnutrition: Secondary | ICD-10-CM | POA: Insufficient documentation

## 2021-11-04 DIAGNOSIS — S72001D Fracture of unspecified part of neck of right femur, subsequent encounter for closed fracture with routine healing: Secondary | ICD-10-CM

## 2021-11-04 DIAGNOSIS — Z86718 Personal history of other venous thrombosis and embolism: Secondary | ICD-10-CM | POA: Diagnosis not present

## 2021-11-04 DIAGNOSIS — D631 Anemia in chronic kidney disease: Secondary | ICD-10-CM

## 2021-11-04 DIAGNOSIS — I10 Essential (primary) hypertension: Secondary | ICD-10-CM

## 2021-11-04 LAB — CBC
HCT: 25.9 % — ABNORMAL LOW (ref 36.0–46.0)
Hemoglobin: 8.3 g/dL — ABNORMAL LOW (ref 12.0–15.0)
MCH: 27.7 pg (ref 26.0–34.0)
MCHC: 32 g/dL (ref 30.0–36.0)
MCV: 86.3 fL (ref 80.0–100.0)
Platelets: 389 10*3/uL (ref 150–400)
RBC: 3 MIL/uL — ABNORMAL LOW (ref 3.87–5.11)
RDW: 14.3 % (ref 11.5–15.5)
WBC: 10.1 10*3/uL (ref 4.0–10.5)
nRBC: 0 % (ref 0.0–0.2)

## 2021-11-04 LAB — BASIC METABOLIC PANEL
Anion gap: 7 (ref 5–15)
BUN: 25 mg/dL — ABNORMAL HIGH (ref 8–23)
CO2: 22 mmol/L (ref 22–32)
Calcium: 8.7 mg/dL — ABNORMAL LOW (ref 8.9–10.3)
Chloride: 112 mmol/L — ABNORMAL HIGH (ref 98–111)
Creatinine, Ser: 1.74 mg/dL — ABNORMAL HIGH (ref 0.44–1.00)
GFR, Estimated: 26 mL/min — ABNORMAL LOW (ref >60)
Glucose, Bld: 102 mg/dL — ABNORMAL HIGH (ref 70–99)
Potassium: 4.7 mmol/L (ref 3.5–5.1)
Sodium: 141 mmol/L (ref 135–145)

## 2021-11-04 LAB — APTT
aPTT: 48 seconds — ABNORMAL HIGH (ref 24–36)
aPTT: 43 seconds — ABNORMAL HIGH (ref 24–36)

## 2021-11-04 LAB — HEPARIN LEVEL (UNFRACTIONATED): Heparin Unfractionated: >1.1 IU/mL — ABNORMAL HIGH (ref 0.30–0.70)

## 2021-11-04 MED ORDER — ADULT MULTIVITAMIN W/MINERALS CH
1.0000 | ORAL_TABLET | Freq: Every day | ORAL | Status: DC
  Administered 2021-11-04 – 2021-11-10 (×6): 1 via ORAL
  Filled 2021-11-04 (×7): qty 1

## 2021-11-04 MED ORDER — ACETAMINOPHEN 325 MG PO TABS
650.0000 mg | ORAL_TABLET | Freq: Four times a day (QID) | ORAL | Status: DC | PRN
Start: 2021-11-04 — End: 2021-11-06
  Administered 2021-11-06: 650 mg via ORAL
  Filled 2021-11-04: qty 2

## 2021-11-04 MED ORDER — OXYCODONE HCL 5 MG PO TABS
5.0000 mg | ORAL_TABLET | Freq: Four times a day (QID) | ORAL | Status: DC | PRN
  Administered 2021-11-04 – 2021-11-10 (×9): 5 mg via ORAL
  Filled 2021-11-04 (×10): qty 1

## 2021-11-04 MED ORDER — HALOPERIDOL LACTATE 5 MG/ML IJ SOLN: 0.5000 mg | Freq: Four times a day (QID) | INTRAMUSCULAR | Status: DC | PRN

## 2021-11-04 MED ORDER — HEPARIN (PORCINE) 25000 UT/250ML-% IV SOLN
900.0000 [IU]/h | INTRAVENOUS | Status: DC
  Administered 2021-11-04: 850 [IU]/h via INTRAVENOUS
  Administered 2021-11-05: 900 [IU]/h via INTRAVENOUS
  Filled 2021-11-04: qty 250

## 2021-11-04 MED ORDER — ENSURE ENLIVE PO LIQD
237.0000 mL | Freq: Two times a day (BID) | ORAL | Status: DC
  Administered 2021-11-04 – 2021-11-10 (×9): 237 mL via ORAL

## 2021-11-04 NOTE — Progress Notes (Signed)
ANTICOAGULATION CONSULT NOTE - Follow Up Consult  Pharmacy Consult for Heparin Indication: DVT  No Known Allergies  Patient Measurements: Height: 4\' 9"  (144.8 cm) Weight: 42 kg (92 lb 9.5 oz) IBW/kg (Calculated) : 38.6 Heparin Dosing Weight:  42 kg  Vital Signs: Temp: 97.7 F (36.5 C) (07/31 1119) BP: 155/71 (07/31 1119) Pulse Rate: 96 (07/31 1119)  Labs: Recent Labs    11/03/21 1608 11/03/21 2224 11/04/21 0612 11/04/21 0846 11/04/21 1620  HGB 9.3*  --  8.3*  --   --   HCT 29.5*  --  25.9*  --   --   PLT 436*  --  389  --   --   APTT  --  30  --  48* 43*  LABPROT  --  17.7*  --   --   --   INR  --  1.5*  --   --   --   HEPARINUNFRC  --  >1.10*  --  >1.10*  --   CREATININE 1.98*  --  1.74*  --   --      Estimated Creatinine Clearance: 10.5 mL/min (A) (by C-G formula based on SCr of 1.74 mg/dL (H)).   Assessment: AC/Heme: Eliquis PTA (LD 7/30, 1230) for left femoral vein DVT on 6/15 >> heparin bridge for hip repair.  This evening, 11/04/21 16:20 aPTT 43, sub-therapeutic with heparin infusing at 750 units/hr No bleeding or heparin related issues per nurse  Goal of Therapy:  Heparin level 0.3-0.7 units/ml aPtt 66-102 Monitor platelets by anticoagulation protocol: Yes   Plan:  Increase IV heparin to 850 units/hr Recheck aPTT in 8 hrs. Hip fx repair 8/2 (stop heparin 0200) Monitor daily aPTT & heparin level, CBC, signs/symptoms of bleeding     Thank you for allowing pharmacy to be a part of this patient's care.  Royetta Asal, PharmD, BCPS Clinical Pharmacist [place] Please utilize Amion for appropriate phone number to reach the unit pharmacist ([place]) 11/04/2021 7:11 PM

## 2021-11-04 NOTE — Progress Notes (Signed)
ANTICOAGULATION CONSULT NOTE - Follow Up Consult  Pharmacy Consult for Heparin Indication: DVT  No Known Allergies  Patient Measurements: Height: 4\' 9"  (144.8 cm) Weight: 42 kg (92 lb 9.5 oz) IBW/kg (Calculated) : 38.6 Heparin Dosing Weight:  42 kg  Vital Signs: Temp: 98 F (36.7 C) (07/31 0853) Temp Source: Oral (07/31 0351) BP: 154/70 (07/31 0853) Pulse Rate: 100 (07/31 0853)  Labs: Recent Labs    11/03/21 1608 11/03/21 2224 11/04/21 0612 11/04/21 0846  HGB 9.3*  --  8.3*  --   HCT 29.5*  --  25.9*  --   PLT 436*  --  389  --   APTT  --  30  --  48*  LABPROT  --  17.7*  --   --   INR  --  1.5*  --   --   HEPARINUNFRC  --  >1.10*  --  >1.10*  CREATININE 1.98*  --  1.74*  --     Estimated Creatinine Clearance: 10.5 mL/min (A) (by C-G formula based on SCr of 1.74 mg/dL (H)).   Assessment: AC/Heme: Eliquis PTA (LD 7/30, 1230) for left femoral vein DVT on 6/15 >> heparin bridge for hip repair. - HL >1.1 due to Eliquis, aPTT 48 low, Hgb 9.3>8.3; plt WNL  Goal of Therapy:  Heparin level 0.3-0.7 units/ml aPtt 66-102 Monitor platelets by anticoagulation protocol: Yes   Plan:  Increase IV heparin to 750 units/hr Recheck aPTT in 6 hrs. Hip fx repair 8/2 (stop heparin 0200) Daily HL and aPTT and CBC    Doyne Micke S. Alford Highland, PharmD, BCPS Clinical Staff Pharmacist [email or web address]  Alford Highland, [place] 11/04/2021,9:43 AM

## 2021-11-04 NOTE — Progress Notes (Addendum)
PROGRESS NOTE   Tracy [name]  TKW:[id number]    DOB: 1920/01/10    DOA: 11/03/2021  PCP: Tracy Ferretti, MD   I have briefly reviewed patients previous medical records in [name].  Chief Complaint  Patient presents with   level 2/fall    Brief Narrative:  A pleasant 88 year old female, PMH significant for stage IV CKD with anemia, HTN, recently hospitalized from 09/15/2021 - 10/14/2021 for a left femoral neck fracture, underwent left hip hemiarthroplasty with Dr. Lyla [name] on 6/12.  She then had a seizure-like activity on 12/29/2681, with an embolic stroke noted on 07/24/6220 and also diagnosed with left femoral vein DVT on 6/15 for which she was taken off DAPT and placed on Eliquis, now presented to the [facility] ED on 11/03/2021 following an accidental fall when she fell backwards hitting her head while trying to pick up something up off the floor, no LOC, suffered a laceration to the occipital scalp.  No LOC.  Admitted for right femoral neck fracture, orthopedics consulted and plan surgery on 8/2 pending Eliquis washout.  Currently on IV heparin bridging.   Assessment & Plan:  Principal Problem:   Closed right hip fracture, initial encounter [facility]) Active Problems:   Accidental fall   History of femoral DVT 09/18/21 (deep vein thrombosis)   Laceration of occipital scalp   Essential hypertension   Anemia due to stage 4 chronic kidney disease (HCC)   Hyperlipidemia, unspecified   Vascular dementia (HCC)   Chronic kidney disease, stage 4 (severe) ([place])   History of embolic stroke 9/79/89   History of seizure 09/16/21   History of repair of left hip joint June 2023   Closed right hip fracture (HCC)   Malnutrition of moderate degree   Right hip fracture/displaced right femoral neck fracture: Sustained post mechanical fall.  History of left hip hemiarthroplasty for fracture on 09/16/2021.  Orthopedics/Dr. Lyla [name] input appreciated and plans right hip hemiarthroplasty on  11/06/2021 to allow Eliquis washout.  Patient denies cardiorespiratory symptoms, has tolerated recent similar procedure and cleared for upcoming surgery.  Currently on IV heparin bridging, to be stopped at 2 AM on 8/2.  History of femoral DVT 09/18/21 (deep vein thrombosis) Last dose of Eliquis 11/03/2021 at 11 AM.  Eliquis now on hold.  On IV heparin bridging to be held on 8/2 at 2 AM for surgery later that morning.   Essential hypertension Reasonably controlled.  Continue as needed IV hydralazine.   Acute blood loss anemia complicating anemia due to stage 4 chronic kidney disease (HCC) Baseline hemoglobin recently has been fluctuating in the 8-9 range.  Presented with hemoglobin of 9.3.  This is dropped to 8.3, could be related to ABLA from fracture blood loss.  Follow CBC daily and transfuse if hemoglobin 7 g or less.   Hyperlipidemia, unspecified Continue atorvastatin   History of repair of left hip joint June 2023 No acute issues suspected.  Healed scar.   History of seizure 09/16/21 Continue Keppra.  Seizure precautions.   History of embolic stroke 05/18/92 Patient was currently on Eliquis so not on DAPT in order to decrease bleeding risk Eliquis will be replaced with heparin bridge   Laceration of occipital scalp Related to fall.  Wound care   Chronic kidney disease, stage 4 (severe) (HCC) Baseline serum creatinine probably in the 1.7-1.9 range.  Presented with creatinine of 1.98, down to 1.74.  Follow BMP daily.   Vascular dementia [name]) Patient is at baseline per conversation with  daughter Delirium precautions   Accidental fall According to niece, patient was walking without her walker when she tried to pick something up and fell backwards hitting her head.  There was no loss of consciousness.  Patient remained awake and alert and was able to call her niece after the fall No evidence of injury beyond right hip fracture PT and OT post surgery.  Pleural effusions on  CT Associated atelectasis.  Also has 1-2+ leg edema.  DC IV fluids.  Could consider low-dose Lasix.  Body mass index is 20.04 kg/m.  Nutrition Status: Nutrition Problem: Moderate Malnutrition Etiology: chronic illness (CKD4) Signs/Symptoms: mild fat depletion, moderate muscle depletion Interventions: Ensure Enlive (each supplement provides 350kcal and 20 grams of protein), MVI    DVT prophylaxis: SCDs Start: 11/03/21 2105.  IV heparin drip as noted above.   Code Status: DNR:  Family Communication: None at bedside. Disposition:  Status is: Inpatient Remains inpatient appropriate because: IV heparin drip, upcoming right hip hemiarthroplasty planned for 8/2.     Consultants:   Orthopedics/Dr. Lyla [name]  Procedures:   None  Antimicrobials:   None   Subjective:  Alert and oriented x2.  Aware that she fell but did not know that she had broken her right hip and was supposed to have surgery.  Did not complain of pain.  However was in a little pain on touching her right leg.  Objective:   Vitals:   11/03/21 2331 11/04/21 0351 11/04/21 0853 11/04/21 1119  BP: (!) 144/68 (!) 149/74 (!) 154/70 (!) 155/71  Pulse: 96 95 100 96  Resp: 20 18 18 16   Temp: 97.9 F (36.6 C) 97.8 F (36.6 C) 98 F (36.7 C) 97.7 F (36.5 C)  TempSrc:  Oral    SpO2: 100% 98% 90% 96%  Weight:      Height:        General exam: Very elderly female, looks younger than stated age, moderately built and nourished lying comfortably supine in bed without distress.  Oral mucosa moist. Respiratory system: Clear to auscultation. Respiratory effort normal. Cardiovascular system: S1 & S2 heard, RRR. No JVD, murmurs, rubs, gallops or clicks.  1-2+ bilateral leg edema.  Not on telemetry. Gastrointestinal system: Abdomen appears distended in the suprapubic and umbilical area,?  Appreciable bladder distention.  Soft and nontender. No organomegaly or masses felt. Normal bowel sounds heard. Central nervous system:  Alert and oriented x2. No focal neurological deficits. Extremities: Symmetric 5 x 5 power in upper extremities and left lower extremity.  Right lower extremity movements restricted due to pain, externally rotated. Skin: No rashes, lesions or ulcers Psychiatry: Judgement and insight appear somewhat impaired. Mood & affect appropriate.     Data Reviewed:   I have personally reviewed following labs and imaging studies   CBC: Recent Labs  Lab 11/03/21 1608 11/04/21 0612  WBC 10.2 10.1  NEUTROABS 6.7  --   HGB 9.3* 8.3*  HCT 29.5* 25.9*  MCV 87.5 86.3  PLT 436* 169    Basic Metabolic Panel: Recent Labs  Lab 11/03/21 1608 11/04/21 0612  NA 140 141  K 4.2 4.7  CL 112* 112*  CO2 21* 22  GLUCOSE 115* 102*  BUN 25* 25*  CREATININE 1.98* 1.74*  CALCIUM 9.0 8.7*    Liver Function Tests: Recent Labs  Lab 11/03/21 1608  AST 21  ALT 17  ALKPHOS 111  BILITOT 0.6  PROT 7.3  ALBUMIN 3.3*    CBG: No results for input(s): "GLUCAP" in the last  168 hours.  Microbiology Studies:  No results found for this or any previous visit (from the past 240 hour(s)).  Radiology Studies:  DG Knee Right Port  Result Date: 11/04/2021 CLINICAL DATA:  Femoral neck fracture EXAM: PORTABLE RIGHT KNEE - 1-2 VIEW COMPARISON:  Right knee radiographs 01/25/2019 FINDINGS: There is no acute fracture or dislocation. Knee alignment is normal. There is moderate medial and lateral compartment joint space narrowing with associated subchondral sclerosis and osteophytosis consistent with osteoarthritis, worse in the lateral compartment. The soft tissues are unremarkable. There is no effusion. IMPRESSION: 1. No acute fracture or dislocation. 2. Degenerative changes primarily affecting the lateral compartment. Electronically Signed   By: Valetta Mole M.D.   On: 11/04/2021 08:49   CT Cervical Spine Wo Contrast  Result Date: 11/03/2021 CLINICAL DATA:  Neck trauma. Fall. Hit back of head on door frame. EXAM: CT  CERVICAL SPINE WITHOUT CONTRAST TECHNIQUE: Multidetector CT imaging of the cervical spine was performed without intravenous contrast. Multiplanar CT image reconstructions were also generated. RADIATION DOSE REDUCTION: This exam was performed according to the departmental dose-optimization program which includes automated exposure control, adjustment of the mA and/or kV according to patient size and/or use of iterative reconstruction technique. COMPARISON:  None Available. FINDINGS: Alignment: Slight degenerative retrolisthesis present C4-5 and C5-6. No significant listhesis is present otherwise. Straightening of the normal cervical lordosis is present. Skull base and vertebrae: The craniocervical junction is normal. Vertebral body heights are normal. No acute fractures are present. Soft tissues and spinal canal: No prevertebral fluid or swelling. No visible canal hematoma. Disc levels: Ankylosis is present at C3-4. Uncovertebral and facet hypertrophy contribute to multilevel foraminal stenosis, greatest at C6-7. Upper chest: Left greater than right pleural effusion is present. Dependent atelectasis is associated. Lung apices are otherwise clear. IMPRESSION: 1. No acute fracture or traumatic subluxation. 2. Multilevel degenerative changes of the cervical spine as described. 3. Left greater than right pleural effusions with associated atelectasis. Electronically Signed   By: San Morelle M.D.   On: 11/03/2021 18:26   CT Hip Right Wo Contrast  Result Date: 11/03/2021 CLINICAL DATA:  Trauma. EXAM: CT OF THE RIGHT HIP WITHOUT CONTRAST TECHNIQUE: Multidetector CT imaging of the right hip was performed according to the standard protocol. Multiplanar CT image reconstructions were also generated. RADIATION DOSE REDUCTION: This exam was performed according to the departmental dose-optimization program which includes automated exposure control, adjustment of the mA and/or kV according to patient size and/or use of  iterative reconstruction technique. COMPARISON:  Right hip CT 02/01/2021 FINDINGS: Bones/Joint/Cartilage The bones are osteopenic. There is a nondisplaced subcapital right femoral neck fracture. There is no evidence for dislocation. There are mild degenerative changes of the right hip. There is small joint effusion with fat fluid level. Ligaments Suboptimally assessed by CT. Muscles and Tendons Unremarkable. Soft tissues There is subcutaneous edema lateral to the right hip. No focal hematoma. IMPRESSION: 1. Acute nondisplaced subcapital right femoral neck fracture. Electronically Signed   By: [name] M.D.   On: 11/03/2021 18:25   CT Head Wo Contrast  Result Date: 11/03/2021 CLINICAL DATA:  Head trauma. Hit back of scalp on door frame. EXAM: CT HEAD WITHOUT CONTRAST TECHNIQUE: Contiguous axial images were obtained from the base of the skull through the vertex without intravenous contrast. RADIATION DOSE REDUCTION: This exam was performed according to the departmental dose-optimization program which includes automated exposure control, adjustment of the mA and/or kV according to patient size and/or use of iterative reconstruction technique.  COMPARISON:  CT head without contrast 01/29/2021. MR head without contrast 09/18/2021 FINDINGS: Brain: Remote infarcts of the anterior right frontal lobe and right occipital lobe are again seen. Advanced atrophy and white matter disease is present. No acute infarct, hemorrhage, or mass lesion is present. The ventricles are proportionate to the degree of atrophy. No significant extraaxial fluid collection is present. Vascular: Atherosclerotic calcifications are present within the cavernous internal carotid arteries, stable. No hyperdense vessel is present. Skull: Calvarium is intact. No focal lytic or blastic lesions are present. No significant extracranial soft tissue lesion is present. Sinuses/Orbits: The paranasal sinuses and mastoid air cells are clear. Bilateral lens  replacements are noted. Globes and orbits are otherwise unremarkable. IMPRESSION: 1. No acute intracranial abnormality or significant interval change. 2. Stable remote infarcts of the anterior right frontal lobe and right occipital lobe. 3. Stable advanced atrophy and white matter disease. This likely reflects the sequela of chronic microvascular ischemia. Electronically Signed   By: San Morelle M.D.   On: 11/03/2021 18:23   DG Hip Unilat W or Wo Pelvis 2-3 Views Right  Result Date: 11/03/2021 CLINICAL DATA:  Fall, right hip pain EXAM: DG HIP (WITH OR WITHOUT PELVIS) 2-3V RIGHT COMPARISON:  02/01/2021 FINDINGS: Bones are mildly demineralized. No evidence of acute fracture. No dislocation. Moderate degenerative changes of the right hip. Prior left hip arthroplasty. No evidence of pelvic diastasis. IMPRESSION: Negative. Electronically Signed   By: [name] D.O.   On: 11/03/2021 16:52   DG Chest Portable 1 View  Result Date: 11/03/2021 CLINICAL DATA:  Fall. EXAM: PORTABLE CHEST 1 VIEW COMPARISON:  Chest x-ray 09/16/2021 FINDINGS: Patient rotation technically limits the study. The aorta is tortuous and the heart is mildly enlarged, unchanged. There is no focal lung infiltrate. Is no pleural effusion or pneumothorax. No acute fractures. IMPRESSION: 1. Limited examination.  No acute cardiopulmonary process. 2. Stable cardiomegaly. Electronically Signed   By: [name] M.D.   On: 11/03/2021 16:51    Scheduled Meds:    allopurinol  200 mg Oral q AM   atorvastatin  20 mg Oral Daily   feeding supplement  237 mL Oral BID BM   levETIRAcetam  250 mg Oral Daily   memantine  5 mg Oral BID   multivitamin with minerals  1 tablet Oral Daily   pantoprazole  40 mg Oral QAC breakfast    Continuous Infusions:    heparin 750 Units/hr (11/04/21 1012)     LOS: 1 day     Vernell Leep, MD,  FACP, [facility], [facility], [facility] (Care Management Physician Certified) [place]  To contact the attending provider between 7A-7P or the covering provider during after hours 7P-7A, please log into the web site [email or web address] and access using universal [place] password for that web site. If you do not have the password, please call the hospital operator.  11/04/2021, 2:01 PM

## 2021-11-04 NOTE — Progress Notes (Signed)
Niece, Ferol Luz, notified of pt's arrival to unit.

## 2021-11-04 NOTE — Progress Notes (Signed)
Patient admitted by [facility] for displaced right femoral neck fx. Known to me for left hip fx in June. Eliquis has been held, and heparin gtt started last night. Plan for right hip hemiarthroplasty on Wednesday to allow Eliquis wash out. NPO after MN Tuesday night. Stop heparin gtt @ 0200 on Wed am 8/2.

## 2021-11-04 NOTE — Progress Notes (Signed)
Initial Nutrition Assessment  DOCUMENTATION CODES:   Non-severe (moderate) malnutrition in context of chronic illness  INTERVENTION:   -Ensure Plus High Protein po BID, each supplement provides 350 kcal and 20 grams of protein.   -Multivitamin with minerals daily  -Liberalize heart healthy diet to regular given advanced age  NUTRITION DIAGNOSIS:   Moderate Malnutrition related to chronic illness (CKD4) as evidenced by mild fat depletion, moderate muscle depletion.  GOAL:   Patient will meet greater than or equal to 90% of their needs  MONITOR:   PO intake, Supplement acceptance, Weight trends, Labs, I & O's  REASON FOR ASSESSMENT:   Consult Hip fracture protocol  ASSESSMENT:   88 y.o. female with medical history significant for CKD 4 with anemia of CKD, hypertension, recently hospitalized from 6/11 to 7/10 with a left femoral neck fracture undergoing left hemiarthroplasty on 6/12, and who had seizure-like activity on 7/02, with an embolic stroke seen on 6/37 and noted to have left femoral vein DVT on 6/15 for which she was taken off DAPT and placed on Eliquis, who returns to the ED following an accidental fall in which she fell backwards hitting her head, while trying to over to pick something up off the floor.  Patient in room, states she tends to eat 2 meals a day. For breakfast she will eat grits, applesauce and coffee. She tries to drink one Ensure daily. For dinner she will usually eat a meat, with green vegetable and mac and cheese. Denies issues chewing or swallowing. Will liberalize diet to regular given advanced age.  Per ortho note, plan is for surgery 8/2. When spoke about surgery, pt seemed surprised and RD encouraged pt to ask MD questions regarding procedure if she had concerns.   Per weight records, no major weight changes noted since 2022.   Medications reviewed.  Labs reviewed.  NUTRITION - FOCUSED PHYSICAL EXAM:  Flowsheet Row Most Recent Value   Orbital Region Mild depletion  Upper Arm Region Moderate depletion  Thoracic and Lumbar Region Unable to assess  Buccal Region Mild depletion  Temple Region Mild depletion  Clavicle Bone Region Moderate depletion  Clavicle and Acromion Bone Region Mild depletion  Scapular Bone Region Mild depletion  Dorsal Hand Moderate depletion  Patellar Region Moderate depletion  Anterior Thigh Region Moderate depletion  Posterior Calf Region Moderate depletion  Edema (RD Assessment) None  Hair Reviewed  Eyes Reviewed  Mouth Reviewed  [missing teeth]  Skin Reviewed       Diet Order:   Diet Order             Diet regular Room service appropriate? No; Fluid consistency: Thin  Diet effective now                   EDUCATION NEEDS:   No education needs have been identified at this time  Skin:  Skin Assessment: Reviewed RN Assessment  Last BM:  PTA  Height:   Ht Readings from Last 1 Encounters:  11/03/21 _0  (1.448 m)    Weight:   Wt Readings from Last 1 Encounters:  11/03/21 42 kg    BMI:  Body mass index is 20.04 kg/m.  Estimated Nutritional Needs:   Kcal:  1150-1350  Protein:  50-60g  Fluid:  1.3L/day  Clayton Bibles, MS, RD, LDN Inpatient Clinical Dietitian Contact information available via Amion

## 2021-11-04 NOTE — TOC Initial Note (Signed)
Transition of Care [facility]) - Initial/Assessment Note    Patient Details  Name: Tracy [name] MRN: [id number] Date of Birth: [birth year]  [facility]) CM/SW Contact:    Dessa Phi, RN Phone Number: 11/04/2021, 3:48 PM  Clinical Narrative: Noted for ortho R hip Hemi Wednesday will f/u post PT eval & recc.                  Expected Discharge Plan:  (TBD) Barriers to Discharge: Continued Medical Work up   Patient Goals and CMS Choice Patient states their goals for this hospitalization and ongoing recovery are:: Home CMS Medicare.gov Compare Post Acute Care list provided to:: Patient Represenative (must comment) Choice offered to / list presented to : Adult Children  Expected Discharge Plan and Services Expected Discharge Plan:  (TBD)   Discharge Planning Services: CM Consult   Living arrangements for the past 2 months: Single Family Home                                      Prior Living Arrangements/Services Living arrangements for the past 2 months: Single Family Home Lives with:: Adult Children Patient language and need for interpreter reviewed:: Yes Do you feel safe going back to the place where you live?: Yes      Need for Family Participation in Patient Care: Yes (Comment) Care giver support system in place?: Yes (comment) Current home services: DME (rw) Criminal Activity/Legal Involvement Pertinent to Current Situation/Hospitalization: No - Comment as needed  Activities of Daily Living Home Assistive Devices/Equipment: Walker (specify type) (front wheel) ADL Screening (condition at time of admission) Patient's cognitive ability adequate to safely complete daily activities?: Yes Is the patient deaf or have difficulty hearing?: No Does the patient have difficulty seeing, even when wearing glasses/contacts?: Yes Does the patient have difficulty concentrating, remembering, or making decisions?: Yes Patient able to express need for assistance with  ADLs?: Yes Does the patient have difficulty dressing or bathing?: Yes Independently performs ADLs?: No Communication: Independent Dressing (OT): Needs assistance Is this a change from baseline?: Change from baseline, expected to last >3 days Grooming: Needs assistance Is this a change from baseline?: Change from baseline, expected to last >3 days Feeding: Independent Bathing: Needs assistance Is this a change from baseline?: Change from baseline, expected to last >3 days Toileting: Needs assistance Is this a change from baseline?: Change from baseline, expected to last >3days In/Out Bed: Needs assistance Is this a change from baseline?: Change from baseline, expected to last >3 days Walks in Home: Independent with device (comment) (uses front walker PRN at home) Does the patient have difficulty walking or climbing stairs?: Yes Weakness of Legs: Both Weakness of Arms/Hands: None  Permission Sought/Granted Permission sought to share information with : Case Manager Permission granted to share information with : Yes, Verbal Permission Granted  Share Information with NAME: Case Manager           Emotional Assessment Appearance:: Appears stated age Attitude/Demeanor/Rapport: Gracious Affect (typically observed): Accepting Orientation: : Oriented to Self Alcohol / Substance Use: Not Applicable Psych Involvement: No (comment)  Admission diagnosis:  Closed right hip fracture ([place]) [S72.001A] Patient Active Problem List   Diagnosis Date Noted   Malnutrition of moderate degree 11/04/2021   History of embolic stroke 07/29/51 61/44/3154   Moderate vascular dementia with agitation ([place]) 11/03/2021   Closed right hip fracture ([place]) 11/03/2021   Acute  deep vein thrombosis (DVT) of left femoral vein ([facility]) 43/53/9122   Acute embolic stroke ([place]) 58/34/6219   Witnessed seizure-like activity ([place]) 09/17/2021   Osteoporosis 09/17/2021   Left homonymous hemianopsia 09/17/2021   Vascular  dementia ([place]) 09/16/2021   Displaced fracture of left femoral neck ([place]) 09/15/2021   Accidental fall 09/15/2021   Essential hypertension 01/30/2021   Anemia due to stage 4 chronic kidney disease ([place]) 01/30/2021   Chronic renal failure syndrome 01/30/2021   Hyperlipidemia, unspecified 01/30/2021   Chronic kidney disease, stage 4 (severe) ([place]) 01/30/2021   Closed right hip fracture, initial encounter ([place]) 01/30/2021   Laceration of occipital scalp 01/30/2021   History of femoral DVT 09/18/21 (deep vein thrombosis) 01/30/2021   History of seizure 09/16/21 01/30/2021   History of repair of left hip joint June 2023 01/30/2021   PCP:  Charlane Ferretti, MD Pharmacy:   [id number] - [place], [place] [address] [place] [address] Phone: [phone] Fax: [phone]     Social Determinants of Health (SDOH) Interventions    Readmission Risk Interventions     No data to display

## 2021-11-05 DIAGNOSIS — I10 Essential (primary) hypertension: Secondary | ICD-10-CM | POA: Diagnosis not present

## 2021-11-05 DIAGNOSIS — N184 Chronic kidney disease, stage 4 (severe): Secondary | ICD-10-CM | POA: Diagnosis not present

## 2021-11-05 DIAGNOSIS — Z86718 Personal history of other venous thrombosis and embolism: Secondary | ICD-10-CM | POA: Diagnosis not present

## 2021-11-05 DIAGNOSIS — S72001D Fracture of unspecified part of neck of right femur, subsequent encounter for closed fracture with routine healing: Secondary | ICD-10-CM | POA: Diagnosis not present

## 2021-11-05 LAB — CBC
HCT: 25.2 % — ABNORMAL LOW (ref 36.0–46.0)
HCT: 29.4 % — ABNORMAL LOW (ref 36.0–46.0)
Hemoglobin: 7.2 g/dL — ABNORMAL LOW (ref 12.0–15.0)
Hemoglobin: 9.7 g/dL — ABNORMAL LOW (ref 12.0–15.0)
MCH: 27.8 pg (ref 26.0–34.0)
MCH: 27.7 pg (ref 26.0–34.0)
MCHC: 28.6 g/dL — ABNORMAL LOW (ref 30.0–36.0)
MCHC: 33 g/dL (ref 30.0–36.0)
MCV: 97.3 fL (ref 80.0–100.0)
MCV: 84 fL (ref 80.0–100.0)
Platelets: 362 10*3/uL (ref 150–400)
Platelets: 349 10*3/uL (ref 150–400)
RBC: 2.59 MIL/uL — ABNORMAL LOW (ref 3.87–5.11)
RBC: 3.5 MIL/uL — ABNORMAL LOW (ref 3.87–5.11)
RDW: 14.9 % (ref 11.5–15.5)
RDW: 14.4 % (ref 11.5–15.5)
WBC: 12.4 10*3/uL — ABNORMAL HIGH (ref 4.0–10.5)
WBC: 14.8 10*3/uL — ABNORMAL HIGH (ref 4.0–10.5)
nRBC: 0 % (ref 0.0–0.2)
nRBC: 0 % (ref 0.0–0.2)

## 2021-11-05 LAB — BASIC METABOLIC PANEL
Anion gap: 10 (ref 5–15)
BUN: 24 mg/dL — ABNORMAL HIGH (ref 8–23)
CO2: 18 mmol/L — ABNORMAL LOW (ref 22–32)
Calcium: 7.9 mg/dL — ABNORMAL LOW (ref 8.9–10.3)
Chloride: 112 mmol/L — ABNORMAL HIGH (ref 98–111)
Creatinine, Ser: 1.61 mg/dL — ABNORMAL HIGH (ref 0.44–1.00)
GFR, Estimated: 28 mL/min — ABNORMAL LOW (ref >60)
Glucose, Bld: 113 mg/dL — ABNORMAL HIGH (ref 70–99)
Potassium: 4.2 mmol/L (ref 3.5–5.1)
Sodium: 140 mmol/L (ref 135–145)

## 2021-11-05 LAB — PREPARE RBC (CROSSMATCH)

## 2021-11-05 LAB — SURGICAL PCR SCREEN
MRSA, PCR: NEGATIVE
Staphylococcus aureus: NEGATIVE

## 2021-11-05 LAB — APTT
aPTT: 83 seconds — ABNORMAL HIGH (ref 24–36)
aPTT: 58 seconds — ABNORMAL HIGH (ref 24–36)
aPTT: 58 seconds — ABNORMAL HIGH (ref 24–36)

## 2021-11-05 LAB — HEPARIN LEVEL (UNFRACTIONATED): Heparin Unfractionated: >1.1 IU/mL — ABNORMAL HIGH (ref 0.30–0.70)

## 2021-11-05 MED ORDER — POVIDONE-IODINE 10 % EX SWAB
2.0000 | Freq: Once | CUTANEOUS | Status: AC
  Administered 2021-11-06: 2 via TOPICAL

## 2021-11-05 MED ORDER — FUROSEMIDE 10 MG/ML IJ SOLN
20.0000 mg | Freq: Once | INTRAMUSCULAR | Status: AC
  Administered 2021-11-05: 20 mg via INTRAVENOUS
  Filled 2021-11-05: qty 2

## 2021-11-05 MED ORDER — CHLORHEXIDINE GLUCONATE 4 % EX LIQD
60.0000 mL | Freq: Once | CUTANEOUS | Status: DC
  Filled 2021-11-05: qty 60

## 2021-11-05 MED ORDER — HEPARIN (PORCINE) 25000 UT/250ML-% IV SOLN: 1000.0000 [IU]/h | INTRAVENOUS | Status: DC

## 2021-11-05 MED ORDER — ACETAMINOPHEN 500 MG PO TABS: 1000.0000 mg | ORAL_TABLET | Freq: Once | ORAL | Status: DC

## 2021-11-05 MED ORDER — POVIDONE-IODINE 10 % EX SWAB: 2.0000 | Freq: Once | CUTANEOUS | Status: DC

## 2021-11-05 MED ORDER — CEFAZOLIN SODIUM-DEXTROSE 2-4 GM/100ML-% IV SOLN
2.0000 g | INTRAVENOUS | Status: AC
  Administered 2021-11-06: 2 g via INTRAVENOUS
  Filled 2021-11-05 (×2): qty 100

## 2021-11-05 MED ORDER — SODIUM CHLORIDE 0.9% IV SOLUTION: Freq: Once | INTRAVENOUS | Status: AC

## 2021-11-05 MED ORDER — TRANEXAMIC ACID-NACL 1000-0.7 MG/100ML-% IV SOLN
1000.0000 mg | INTRAVENOUS | Status: AC
  Administered 2021-11-06: 1000 mg via INTRAVENOUS
  Filled 2021-11-05 (×2): qty 100

## 2021-11-05 NOTE — Progress Notes (Signed)
ANTICOAGULATION CONSULT NOTE - Follow Up Consult  Pharmacy Consult for Heparin Indication: DVT  No Known Allergies  Patient Measurements: Height: 4\' 9"  (144.8 cm) Weight: 42 kg (92 lb 9.5 oz) IBW/kg (Calculated) : 38.6 Heparin Dosing Weight:  42 kg  Vital Signs: Temp: 99.1 F (37.3 C) (08/01 1218) Temp Source: Oral (08/01 1218) BP: 137/73 (08/01 1218) Pulse Rate: 100 (08/01 1218)  Labs: Recent Labs    11/03/21 1608 11/03/21 1608 11/03/21 2224 11/04/21 0612 11/04/21 0846 11/04/21 1620 11/05/21 0404 11/05/21 1044  HGB 9.3*  --   --  8.3*  --   --  7.2*  --   HCT 29.5*  --   --  25.9*  --   --  25.2*  --   PLT 436*  --   --  389  --   --  362  --   APTT  --    < > 30  --  48* 43* 83* 58*  LABPROT  --   --  17.7*  --   --   --   --   --   INR  --   --  1.5*  --   --   --   --   --   HEPARINUNFRC  --   --  >1.10*  --  >1.10*  --  >1.10*  --   CREATININE 1.98*  --   --  1.74*  --   --  1.61*  --    < > = values in this interval not displayed.    Estimated Creatinine Clearance: 11.3 mL/min (A) (by C-G formula based on SCr of 1.61 mg/dL (H)).   Assessment:  AC/Heme: Eliquis PTA (LD 7/30, 1230) for left femoral vein DVT on 6/15 >> heparin bridge for hip repair. - HL >1.1 due to Eliquis, aPTT 83 with repeat aPTT 58 low, Hgb 9.3>8.3>7.2; plt WNL  Goal of Therapy:  aPTT 66-102 seconds Monitor platelets by anticoagulation protocol: Yes   Plan:  Increase IV heparin to 900 units/hr Recheck in 6 hrs. Hip fx repair 8/2 (stop heparin 0200) Daily HL and aPTT and CBC   Lovella Hardie S. Alford Highland, PharmD, BCPS Clinical Staff Pharmacist [email or web address] Alford Highland, Jabarie Pop Stillinger 11/05/2021,12:54 PM

## 2021-11-05 NOTE — Progress Notes (Addendum)
ANTICOAGULATION CONSULT NOTE - Follow Up Consult  Pharmacy Consult for Heparin Indication: DVT  No Known Allergies  Patient Measurements: Height: 4\' 9"  (144.8 cm) Weight: 42 kg (92 lb 9.5 oz) IBW/kg (Calculated) : 38.6 Heparin Dosing Weight:  42 kg  Vital Signs: Temp: 98.2 F (36.8 C) (07/31 2047) BP: 153/80 (07/31 2047) Pulse Rate: 108 (07/31 2047)  Labs: Recent Labs    11/03/21 1608 11/03/21 1608 11/03/21 2224 11/04/21 0612 11/04/21 0846 11/04/21 1620 11/05/21 0404  HGB 9.3*  --   --  8.3*  --   --  7.2*  HCT 29.5*  --   --  25.9*  --   --  25.2*  PLT 436*  --   --  389  --   --  362  APTT  --    < > 30  --  48* 43* 83*  LABPROT  --   --  17.7*  --   --   --   --   INR  --   --  1.5*  --   --   --   --   HEPARINUNFRC  --   --  >1.10*  --  >1.10*  --  >1.10*  CREATININE 1.98*  --   --  1.74*  --   --  1.61*   < > = values in this interval not displayed.     Estimated Creatinine Clearance: 11.3 mL/min (A) (by C-G formula based on SCr of 1.61 mg/dL (H)).   Assessment: AC/Heme: Eliquis PTA (LD 7/30, 1230) for left femoral vein DVT on 6/15 >> heparin bridge for hip repair.  11/05/2021 aPTT 83 therapeutic on 850 units/hr HL > 1.1 as expected with eliquis still on board Hgb down to 7.2, plts WNL No bleeding or interruptions per RN  Goal of Therapy:  Heparin level 0.3-0.7 units/ml aPtt 66-102 Monitor platelets by anticoagulation protocol: Yes   Plan:  continue heparin drip at 850 units/hr Recheck aPTT in 8 hrs. Hip fx repair 8/2 (stop heparin 0200) Monitor daily aPTT & heparin level, CBC, signs/symptoms of bleeding   Thank you for allowing pharmacy to be a part of this patient's care.  Dolly Rias RPh 11/05/2021, 5:24 AM

## 2021-11-05 NOTE — Progress Notes (Signed)
Pharmacy Brief Note - Evening Anticoagulation Follow Up:  Patient is a 78 yoF on heparin drip for history of DVT while apixaban on hold for surgery. Heparin drip being monitored with aPTT. For full history, see note by Eilene Ghazi, PharmD from earlier today.   Assessment: aPTT = 58 seconds remains subtherapeutic with no increase despite increasing rate of heparin infusion to 900 units/hr Confirmed with RN that heparin infusing at correct rate. No signs of bleeding. No line issues or interruptions.  Goal: aPTT 66 - 102 seconds  Plan: No bolus since patient going for surgery tomorrow Increase heparin infusion to 1000 units/hr CBC with AM labs tomorrow Heparin drip to stop at 0200 tomorrow morning prior to surgery. No additional HL/aPTT ordered at this time. Follow up for post-op anticoagulation plan.  Lenis Noon, PharmD 11/05/21 4:17 PM

## 2021-11-05 NOTE — Progress Notes (Signed)
Patient seen and examined. Plan for right hip hemiarthroplasty on Wednesday to allow Eliquis wash out. NPO after MN Tuesday night. Stop heparin gtt @ 0200 on Wed am 8/2.

## 2021-11-05 NOTE — Progress Notes (Signed)
Attempted x 3 to assess patient and reposition for end of shift round, patient refused because she was eating , family at bedside and aware

## 2021-11-05 NOTE — Progress Notes (Signed)
Per ortho tech, Tracy [name], patient does not meet criteria for trapeze due to age

## 2021-11-05 NOTE — Progress Notes (Signed)
Notified provider about post transfusion hemoglobin of 9.7.

## 2021-11-05 NOTE — Progress Notes (Signed)
PROGRESS NOTE   Tracy [name]  IRJ:[id number]    DOB: Jun 03, 1919    DOA: 11/03/2021  PCP: Charlane Ferretti, MD   I have briefly reviewed patients previous medical records in [facility].  Chief Complaint  Patient presents with   level 2/fall    Brief Narrative:  A pleasant 88 year old female, PMH significant for stage IV CKD with anemia, HTN, recently hospitalized from 09/15/2021 - 10/14/2021 for a left femoral neck fracture, underwent left hip hemiarthroplasty with Dr. Lyla Glassing on 6/12.  She then had a seizure-like activity on 06/05/6008, with an embolic stroke noted on 9/32/3557 and also diagnosed with left femoral vein DVT on 6/15 for which she was taken off DAPT and placed on Eliquis, now presented to the [facility] ED on 11/03/2021 following an accidental fall when she fell backwards hitting her head while trying to pick up something up off the floor, no LOC, suffered a laceration to the occipital scalp.  No LOC.  Admitted for right femoral neck fracture, orthopedics consulted and plan surgery on 8/2 pending Eliquis washout.  Currently on IV heparin bridging.  Hemoglobin down to 7.2, transfusing 1 unit PRBC 8/1.   Assessment & Plan:  Principal Problem:   Closed right hip fracture, initial encounter [facility]) Active Problems:   Accidental fall   History of femoral DVT 09/18/21 (deep vein thrombosis)   Laceration of occipital scalp   Essential hypertension   Anemia due to stage 4 chronic kidney disease (HCC)   Hyperlipidemia, unspecified   Vascular dementia (HCC)   Chronic kidney disease, stage 4 (severe) ([place])   History of embolic stroke 06/26/00   History of seizure 09/16/21   History of repair of left hip joint June 2023   Closed right hip fracture (HCC)   Malnutrition of moderate degree   Right hip fracture/displaced right femoral neck fracture: Sustained post mechanical fall.  History of left hip hemiarthroplasty for fracture on 09/16/2021.  Orthopedics/Dr. Lyla Glassing  input appreciated and plans right hip hemiarthroplasty on 11/06/2021 to allow Eliquis washout.  Patient denies cardiorespiratory symptoms, has tolerated recent similar procedure and cleared for upcoming surgery.  Currently on IV heparin bridging, to be stopped at 2 AM on 8/2.  History of femoral DVT 09/18/21 (deep vein thrombosis) Last dose of Eliquis 11/03/2021 at 11 AM.  Eliquis now on hold.  On IV heparin bridging to be held on 8/2 at 2 AM for surgery later that morning.   Essential hypertension Reasonably controlled.  Continue as needed IV hydralazine.   Acute blood loss anemia complicating anemia due to stage 4 chronic kidney disease (HCC) Baseline hemoglobin recently has been fluctuating in the 8-9 range.  Presented with hemoglobin of 9.3.  Hemoglobin has gradually drifted down, 9.3 > 8.3 > 7.2, suspected due to fracture site bleeding.  No other overt bleeding noted.  Discussed with patient and patient's niece who is HCPOA, they consented and transfusing 1 unit of PRBC, follow posttransfusion CBC and attempt to keep it 8 or above given upcoming surgery and expected blood loss again.  Follow CBC daily.  Given significant volume overload, 2+ leg edema, gave a dose of IV Lasix 20 mg prior to transfusion.   Hyperlipidemia, unspecified Continue atorvastatin   History of repair of left hip joint June 2023 No acute issues suspected.  Healed scar.   History of seizure 09/16/21 Continue Keppra.  Seizure precautions.   History of embolic stroke 5/42/70 Patient was currently on Eliquis so not on DAPT in  order to decrease bleeding risk Eliquis on hold.  On heparin bridge now.  Postop, resume Eliquis when cleared by orthopedics.   Laceration of occipital scalp Related to fall.  Wound care   Chronic kidney disease, stage 4 (severe) (HCC) Baseline serum creatinine probably in the 1.7-1.9 range.  Presented with creatinine of 1.98, down to 1.74 >1.61.  Follow daily BMP.   Vascular dementia  [facility]) Patient is at baseline per conversation with daughter (?  Or did they actually speak with her niece). Delirium precautions   Accidental fall According to niece, patient was walking without her walker when she tried to pick something up and fell backwards hitting her head.  There was no loss of consciousness.  Patient remained awake and alert and was able to call her niece after the fall No evidence of injury beyond right hip fracture PT and OT post surgery.  Pleural effusions on CT Associated atelectasis.  Also has 1-2+ leg edema.  DC IV fluids.  Give a dose of IV Lasix 20 mg x 1.  Strict intake and output.  Follow clinically.  Body mass index is 20.04 kg/m.  Nutrition Status: Nutrition Problem: Moderate Malnutrition Etiology: chronic illness (CKD4) Signs/Symptoms: mild fat depletion, moderate muscle depletion Interventions: Ensure Enlive (each supplement provides 350kcal and 20 grams of protein), MVI    DVT prophylaxis: SCDs Start: 11/03/21 2105.  IV heparin drip as noted above.   Code Status: DNR:  Family Communication: Discussed in detail with patient's niece, HCPOA on the phone, updated care and answered all questions. Disposition:  Status is: Inpatient Remains inpatient appropriate because: IV heparin drip, upcoming right hip hemiarthroplasty planned for 8/2.     Consultants:   Orthopedics/Dr. Lyla Glassing  Procedures:   None  Antimicrobials:   None   Subjective:  Patient herself denies any complaints.  Poor historian, likely related to cognitive impairment which was confirmed by niece.  Denies pain.  Despite having explained to her yesterday that she was going to have surgery, she was surprised to hear that she was going to have surgery tomorrow.  Objective:   Vitals:   11/05/21 0646 11/05/21 1218 11/05/21 1335 11/05/21 1408  BP: (!) 149/71 137/73 (!) 144/74 (!) 154/70  Pulse: 100 100 100 (!) 101  Resp: 18 20 18 17   Temp: 98.1 F (36.7 C) 99.1 F (37.3 C)  98.3 F (36.8 C) 99.3 F (37.4 C)  TempSrc: Oral Oral Oral Oral  SpO2: 96% 97% 98% 100%  Weight:      Height:        General exam: Very elderly female, looks younger than stated age, moderately built and nourished sitting up comfortably in bed with her breakfast tray in front of her and had eaten some.  Oral mucosa moist but pale. Respiratory system: Clear to auscultation.  No increased work of breathing. Cardiovascular system: S1 & S2 heard, RRR. No JVD, murmurs, rubs, gallops or clicks.  2+ pitting bilateral leg edema up to knees. Gastrointestinal system: Abdomen is nondistended, soft and nontender.  No organomegaly or masses appreciated.  Normal bowel sounds heard. Central nervous system: Alert and oriented x2. No focal neurological deficits. Extremities: Symmetric 5 x 5 power in upper extremities and left lower extremity.  Right lower extremity movements restricted due to pain, externally rotated.  Right hip site without any external bruising, ecchymosis or evidence of hematoma. Skin: No rashes, lesions or ulcers Psychiatry: Judgement and insight impaired. Mood & affect appropriate.     Data Reviewed:  I have personally reviewed following labs and imaging studies   CBC: Recent Labs  Lab 11/03/21 1608 11/04/21 0612 11/05/21 0404  WBC 10.2 10.1 12.4*  NEUTROABS 6.7  --   --   HGB 9.3* 8.3* 7.2*  HCT 29.5* 25.9* 25.2*  MCV 87.5 86.3 97.3  PLT 436* 389 628    Basic Metabolic Panel: Recent Labs  Lab 11/03/21 1608 11/04/21 0612 11/05/21 0404  NA 140 141 140  K 4.2 4.7 4.2  CL 112* 112* 112*  CO2 21* 22 18*  GLUCOSE 115* 102* 113*  BUN 25* 25* 24*  CREATININE 1.98* 1.74* 1.61*  CALCIUM 9.0 8.7* 7.9*    Liver Function Tests: Recent Labs  Lab 11/03/21 1608  AST 21  ALT 17  ALKPHOS 111  BILITOT 0.6  PROT 7.3  ALBUMIN 3.3*    CBG: No results for input(s): "GLUCAP" in the last 168 hours.  Microbiology Studies:  No results found for this or any previous  visit (from the past 240 hour(s)).  Radiology Studies:  DG Knee Right Port  Result Date: 11/04/2021 CLINICAL DATA:  Femoral neck fracture EXAM: PORTABLE RIGHT KNEE - 1-2 VIEW COMPARISON:  Right knee radiographs 01/25/2019 FINDINGS: There is no acute fracture or dislocation. Knee alignment is normal. There is moderate medial and lateral compartment joint space narrowing with associated subchondral sclerosis and osteophytosis consistent with osteoarthritis, worse in the lateral compartment. The soft tissues are unremarkable. There is no effusion. IMPRESSION: 1. No acute fracture or dislocation. 2. Degenerative changes primarily affecting the lateral compartment. Electronically Signed   By: Valetta Mole M.D.   On: 11/04/2021 08:49   CT Cervical Spine Wo Contrast  Result Date: 11/03/2021 CLINICAL DATA:  Neck trauma. Fall. Hit back of head on door frame. EXAM: CT CERVICAL SPINE WITHOUT CONTRAST TECHNIQUE: Multidetector CT imaging of the cervical spine was performed without intravenous contrast. Multiplanar CT image reconstructions were also generated. RADIATION DOSE REDUCTION: This exam was performed according to the departmental dose-optimization program which includes automated exposure control, adjustment of the mA and/or kV according to patient size and/or use of iterative reconstruction technique. COMPARISON:  None Available. FINDINGS: Alignment: Slight degenerative retrolisthesis present C4-5 and C5-6. No significant listhesis is present otherwise. Straightening of the normal cervical lordosis is present. Skull base and vertebrae: The craniocervical junction is normal. Vertebral body heights are normal. No acute fractures are present. Soft tissues and spinal canal: No prevertebral fluid or swelling. No visible canal hematoma. Disc levels: Ankylosis is present at C3-4. Uncovertebral and facet hypertrophy contribute to multilevel foraminal stenosis, greatest at C6-7. Upper chest: Left greater than right  pleural effusion is present. Dependent atelectasis is associated. Lung apices are otherwise clear. IMPRESSION: 1. No acute fracture or traumatic subluxation. 2. Multilevel degenerative changes of the cervical spine as described. 3. Left greater than right pleural effusions with associated atelectasis. Electronically Signed   By: San Morelle M.D.   On: 11/03/2021 18:26   CT Hip Right Wo Contrast  Result Date: 11/03/2021 CLINICAL DATA:  Trauma. EXAM: CT OF THE RIGHT HIP WITHOUT CONTRAST TECHNIQUE: Multidetector CT imaging of the right hip was performed according to the standard protocol. Multiplanar CT image reconstructions were also generated. RADIATION DOSE REDUCTION: This exam was performed according to the departmental dose-optimization program which includes automated exposure control, adjustment of the mA and/or kV according to patient size and/or use of iterative reconstruction technique. COMPARISON:  Right hip CT 02/01/2021 FINDINGS: Bones/Joint/Cartilage The bones are osteopenic. There is a nondisplaced  subcapital right femoral neck fracture. There is no evidence for dislocation. There are mild degenerative changes of the right hip. There is small joint effusion with fat fluid level. Ligaments Suboptimally assessed by CT. Muscles and Tendons Unremarkable. Soft tissues There is subcutaneous edema lateral to the right hip. No focal hematoma. IMPRESSION: 1. Acute nondisplaced subcapital right femoral neck fracture. Electronically Signed   By: [name] M.D.   On: 11/03/2021 18:25   CT Head Wo Contrast  Result Date: 11/03/2021 CLINICAL DATA:  Head trauma. Hit back of scalp on door frame. EXAM: CT HEAD WITHOUT CONTRAST TECHNIQUE: Contiguous axial images were obtained from the base of the skull through the vertex without intravenous contrast. RADIATION DOSE REDUCTION: This exam was performed according to the departmental dose-optimization program which includes automated exposure control,  adjustment of the mA and/or kV according to patient size and/or use of iterative reconstruction technique. COMPARISON:  CT head without contrast 01/29/2021. MR head without contrast 09/18/2021 FINDINGS: Brain: Remote infarcts of the anterior right frontal lobe and right occipital lobe are again seen. Advanced atrophy and white matter disease is present. No acute infarct, hemorrhage, or mass lesion is present. The ventricles are proportionate to the degree of atrophy. No significant extraaxial fluid collection is present. Vascular: Atherosclerotic calcifications are present within the cavernous internal carotid arteries, stable. No hyperdense vessel is present. Skull: Calvarium is intact. No focal lytic or blastic lesions are present. No significant extracranial soft tissue lesion is present. Sinuses/Orbits: The paranasal sinuses and mastoid air cells are clear. Bilateral lens replacements are noted. Globes and orbits are otherwise unremarkable. IMPRESSION: 1. No acute intracranial abnormality or significant interval change. 2. Stable remote infarcts of the anterior right frontal lobe and right occipital lobe. 3. Stable advanced atrophy and white matter disease. This likely reflects the sequela of chronic microvascular ischemia. Electronically Signed   By: San Morelle M.D.   On: 11/03/2021 18:23   DG Hip Unilat W or Wo Pelvis 2-3 Views Right  Result Date: 11/03/2021 CLINICAL DATA:  Fall, right hip pain EXAM: DG HIP (WITH OR WITHOUT PELVIS) 2-3V RIGHT COMPARISON:  02/01/2021 FINDINGS: Bones are mildly demineralized. No evidence of acute fracture. No dislocation. Moderate degenerative changes of the right hip. Prior left hip arthroplasty. No evidence of pelvic diastasis. IMPRESSION: Negative. Electronically Signed   By: [name] D.O.   On: 11/03/2021 16:52   DG Chest Portable 1 View  Result Date: 11/03/2021 CLINICAL DATA:  Fall. EXAM: PORTABLE CHEST 1 VIEW COMPARISON:  Chest x-ray 09/16/2021  FINDINGS: Patient rotation technically limits the study. The aorta is tortuous and the heart is mildly enlarged, unchanged. There is no focal lung infiltrate. Is no pleural effusion or pneumothorax. No acute fractures. IMPRESSION: 1. Limited examination.  No acute cardiopulmonary process. 2. Stable cardiomegaly. Electronically Signed   By: [name] M.D.   On: 11/03/2021 16:51    Scheduled Meds:    allopurinol  200 mg Oral q AM   atorvastatin  20 mg Oral Daily   feeding supplement  237 mL Oral BID BM   levETIRAcetam  250 mg Oral Daily   memantine  5 mg Oral BID   multivitamin with minerals  1 tablet Oral Daily   pantoprazole  40 mg Oral QAC breakfast    Continuous Infusions:    heparin 900 Units/hr (11/05/21 1321)     LOS: 2 days     Vernell Leep, MD,  FACP, [facility], [facility], [facility] (Care Management Physician Certified) [facility] [place]  To contact the attending provider between 7A-7P or the covering provider during after hours 7P-7A, please log into the web site [email or web address] and access using universal [place] password for that web site. If you do not have the password, please call the hospital operator.  11/05/2021, 2:32 PM

## 2021-11-06 ENCOUNTER — Inpatient Hospital Stay (HOSPITAL_COMMUNITY): Payer: Medicare PPO

## 2021-11-06 ENCOUNTER — Encounter (HOSPITAL_COMMUNITY): Payer: Self-pay | Admitting: Internal Medicine

## 2021-11-06 ENCOUNTER — Inpatient Hospital Stay (HOSPITAL_COMMUNITY): Payer: Medicare PPO | Admitting: Anesthesiology

## 2021-11-06 ENCOUNTER — Other Ambulatory Visit: Payer: Self-pay

## 2021-11-06 ENCOUNTER — Encounter (HOSPITAL_COMMUNITY)
Admission: EM | Disposition: A | Discharge status: Skilled Nursing Facility | Payer: Self-pay | Source: Home / Self Care | Attending: Internal Medicine

## 2021-11-06 DIAGNOSIS — I10 Essential (primary) hypertension: Secondary | ICD-10-CM | POA: Diagnosis not present

## 2021-11-06 DIAGNOSIS — S72001A Fracture of unspecified part of neck of right femur, initial encounter for closed fracture: Secondary | ICD-10-CM

## 2021-11-06 DIAGNOSIS — D649 Anemia, unspecified: Secondary | ICD-10-CM

## 2021-11-06 DIAGNOSIS — Z8673 Personal history of transient ischemic attack (TIA), and cerebral infarction without residual deficits: Secondary | ICD-10-CM

## 2021-11-06 DIAGNOSIS — N184 Chronic kidney disease, stage 4 (severe): Secondary | ICD-10-CM | POA: Diagnosis not present

## 2021-11-06 DIAGNOSIS — Z86718 Personal history of other venous thrombosis and embolism: Secondary | ICD-10-CM | POA: Diagnosis not present

## 2021-11-06 HISTORY — PX: ANTERIOR APPROACH HEMI HIP ARTHROPLASTY: SHX6690

## 2021-11-06 LAB — BPAM RBC
Blood Product Expiration Date: 202309042359
ISSUE DATE / TIME: 202308011343
Unit Type and Rh: 5100

## 2021-11-06 LAB — CBC
HCT: 27.9 % — ABNORMAL LOW (ref 36.0–46.0)
Hemoglobin: 9 g/dL — ABNORMAL LOW (ref 12.0–15.0)
MCH: 27.8 pg (ref 26.0–34.0)
MCHC: 32.3 g/dL (ref 30.0–36.0)
MCV: 86.1 fL (ref 80.0–100.0)
Platelets: 310 10*3/uL (ref 150–400)
RBC: 3.24 MIL/uL — ABNORMAL LOW (ref 3.87–5.11)
RDW: 14.2 % (ref 11.5–15.5)
WBC: 15.1 10*3/uL — ABNORMAL HIGH (ref 4.0–10.5)
nRBC: 0 % (ref 0.0–0.2)

## 2021-11-06 LAB — TYPE AND SCREEN
ABO/RH(D): O POS
Antibody Screen: NEGATIVE
Unit division: 0

## 2021-11-06 LAB — BASIC METABOLIC PANEL
Anion gap: 6 (ref 5–15)
BUN: 28 mg/dL — ABNORMAL HIGH (ref 8–23)
CO2: 23 mmol/L (ref 22–32)
Calcium: 7.9 mg/dL — ABNORMAL LOW (ref 8.9–10.3)
Chloride: 110 mmol/L (ref 98–111)
Creatinine, Ser: 1.87 mg/dL — ABNORMAL HIGH (ref 0.44–1.00)
GFR, Estimated: 24 mL/min — ABNORMAL LOW (ref >60)
Glucose, Bld: 114 mg/dL — ABNORMAL HIGH (ref 70–99)
Potassium: 4.3 mmol/L (ref 3.5–5.1)
Sodium: 139 mmol/L (ref 135–145)

## 2021-11-06 SURGERY — HEMIARTHROPLASTY, HIP, DIRECT ANTERIOR APPROACH, FOR FRACTURE
Anesthesia: General | Laterality: Right

## 2021-11-06 MED ORDER — CALCIUM CARBONATE 1250 (500 CA) MG PO TABS
1.0000 | ORAL_TABLET | Freq: Every day | ORAL | Status: DC
  Administered 2021-11-07 – 2021-11-10 (×4): 1250 mg via ORAL
  Filled 2021-11-06 (×4): qty 1

## 2021-11-06 MED ORDER — LIDOCAINE HCL (PF) 2 % IJ SOLN
INTRAMUSCULAR | Status: AC
  Filled 2021-11-06: qty 5

## 2021-11-06 MED ORDER — LACTATED RINGERS IV SOLN: INTRAVENOUS | Status: DC | PRN

## 2021-11-06 MED ORDER — KETOROLAC TROMETHAMINE 30 MG/ML IJ SOLN
INTRAMUSCULAR | Status: DC | PRN
  Administered 2021-11-06: 30 mg

## 2021-11-06 MED ORDER — PHENOL 1.4 % MT LIQD: 1.0000 | OROMUCOSAL | Status: DC | PRN

## 2021-11-06 MED ORDER — OXYCODONE HCL 5 MG PO TABS: 5.0000 mg | ORAL_TABLET | Freq: Once | ORAL | Status: DC | PRN

## 2021-11-06 MED ORDER — DEXAMETHASONE SODIUM PHOSPHATE 10 MG/ML IJ SOLN
INTRAMUSCULAR | Status: DC | PRN
  Administered 2021-11-06: 4 mg via INTRAVENOUS

## 2021-11-06 MED ORDER — ONDANSETRON HCL 4 MG/2ML IJ SOLN
INTRAMUSCULAR | Status: AC
  Filled 2021-11-06: qty 2

## 2021-11-06 MED ORDER — OXYCODONE HCL 5 MG/5ML PO SOLN: 5.0000 mg | Freq: Once | ORAL | Status: DC | PRN

## 2021-11-06 MED ORDER — BUPIVACAINE-EPINEPHRINE (PF) 0.25% -1:200000 IJ SOLN
INTRAMUSCULAR | Status: DC | PRN
  Administered 2021-11-06: 30 mL

## 2021-11-06 MED ORDER — DOCUSATE SODIUM 100 MG PO CAPS
100.0000 mg | ORAL_CAPSULE | Freq: Two times a day (BID) | ORAL | Status: DC
  Administered 2021-11-06 – 2021-11-09 (×7): 100 mg via ORAL
  Filled 2021-11-06 (×7): qty 1

## 2021-11-06 MED ORDER — PROPOFOL 10 MG/ML IV BOLUS
INTRAVENOUS | Status: AC
  Filled 2021-11-06: qty 20

## 2021-11-06 MED ORDER — ONDANSETRON HCL 4 MG/2ML IJ SOLN: 4.0000 mg | Freq: Once | INTRAMUSCULAR | Status: DC | PRN

## 2021-11-06 MED ORDER — SENNA 8.6 MG PO TABS
1.0000 | ORAL_TABLET | Freq: Two times a day (BID) | ORAL | Status: DC
  Administered 2021-11-06 – 2021-11-07 (×3): 8.6 mg via ORAL
  Filled 2021-11-06 (×3): qty 1

## 2021-11-06 MED ORDER — SODIUM CHLORIDE (PF) 0.9 % IJ SOLN
INTRAMUSCULAR | Status: DC | PRN
  Administered 2021-11-06: 30 mL

## 2021-11-06 MED ORDER — SODIUM CHLORIDE 0.9 % IR SOLN
Status: DC | PRN
  Administered 2021-11-06: 1000 mL
  Administered 2021-11-06: 3000 mL

## 2021-11-06 MED ORDER — ROCURONIUM BROMIDE 10 MG/ML (PF) SYRINGE
PREFILLED_SYRINGE | INTRAVENOUS | Status: AC
  Filled 2021-11-06: qty 10

## 2021-11-06 MED ORDER — ONDANSETRON HCL 4 MG/2ML IJ SOLN: 4.0000 mg | Freq: Four times a day (QID) | INTRAMUSCULAR | Status: DC | PRN

## 2021-11-06 MED ORDER — APIXABAN 2.5 MG PO TABS
2.5000 mg | ORAL_TABLET | Freq: Two times a day (BID) | ORAL | Status: DC
  Administered 2021-11-07 – 2021-11-10 (×7): 2.5 mg via ORAL
  Filled 2021-11-06 (×7): qty 1

## 2021-11-06 MED ORDER — POLYETHYLENE GLYCOL 3350 17 G PO PACK: 17.0000 g | PACK | Freq: Every day | ORAL | Status: DC | PRN

## 2021-11-06 MED ORDER — VITAMIN D 25 MCG (1000 UNIT) PO TABS
1000.0000 [IU] | ORAL_TABLET | Freq: Every day | ORAL | Status: DC
  Administered 2021-11-06 – 2021-11-10 (×5): 1000 [IU] via ORAL
  Filled 2021-11-06 (×5): qty 1

## 2021-11-06 MED ORDER — FENTANYL CITRATE (PF) 100 MCG/2ML IJ SOLN
INTRAMUSCULAR | Status: DC | PRN
  Administered 2021-11-06 (×3): 50 ug via INTRAVENOUS

## 2021-11-06 MED ORDER — MENTHOL 3 MG MT LOZG: 1.0000 | LOZENGE | OROMUCOSAL | Status: DC | PRN

## 2021-11-06 MED ORDER — AMLODIPINE BESYLATE 10 MG PO TABS: 10.0000 mg | ORAL_TABLET | Freq: Every day | ORAL | Status: DC

## 2021-11-06 MED ORDER — PHENYLEPHRINE 80 MCG/ML (10ML) SYRINGE FOR IV PUSH (FOR BLOOD PRESSURE SUPPORT)
PREFILLED_SYRINGE | INTRAVENOUS | Status: AC
  Filled 2021-11-06: qty 10

## 2021-11-06 MED ORDER — PROPOFOL 10 MG/ML IV BOLUS
INTRAVENOUS | Status: DC | PRN
  Administered 2021-11-06: 60 mg via INTRAVENOUS

## 2021-11-06 MED ORDER — BUPIVACAINE-EPINEPHRINE (PF) 0.25% -1:200000 IJ SOLN
INTRAMUSCULAR | Status: AC
  Filled 2021-11-06: qty 30

## 2021-11-06 MED ORDER — METOCLOPRAMIDE HCL 5 MG PO TABS: 5.0000 mg | ORAL_TABLET | Freq: Three times a day (TID) | ORAL | Status: DC | PRN

## 2021-11-06 MED ORDER — SUGAMMADEX SODIUM 200 MG/2ML IV SOLN
INTRAVENOUS | Status: DC | PRN
  Administered 2021-11-06: 100 mg via INTRAVENOUS

## 2021-11-06 MED ORDER — 0.9 % SODIUM CHLORIDE (POUR BTL) OPTIME
TOPICAL | Status: DC | PRN
  Administered 2021-11-06: 1000 mL

## 2021-11-06 MED ORDER — ONDANSETRON HCL 4 MG/2ML IJ SOLN
INTRAMUSCULAR | Status: DC | PRN
  Administered 2021-11-06: 4 mg via INTRAVENOUS

## 2021-11-06 MED ORDER — FENTANYL CITRATE (PF) 100 MCG/2ML IJ SOLN
INTRAMUSCULAR | Status: AC
  Filled 2021-11-06: qty 2

## 2021-11-06 MED ORDER — KETOROLAC TROMETHAMINE 30 MG/ML IJ SOLN
INTRAMUSCULAR | Status: AC
  Filled 2021-11-06: qty 1

## 2021-11-06 MED ORDER — LIDOCAINE HCL (CARDIAC) PF 100 MG/5ML IV SOSY
PREFILLED_SYRINGE | INTRAVENOUS | Status: DC | PRN
  Administered 2021-11-06: 50 mg via INTRAVENOUS

## 2021-11-06 MED ORDER — SODIUM CHLORIDE (PF) 0.9 % IJ SOLN
INTRAMUSCULAR | Status: AC
  Filled 2021-11-06: qty 150

## 2021-11-06 MED ORDER — METOCLOPRAMIDE HCL 5 MG/ML IJ SOLN: 5.0000 mg | Freq: Three times a day (TID) | INTRAMUSCULAR | Status: DC | PRN

## 2021-11-06 MED ORDER — DEXAMETHASONE SODIUM PHOSPHATE 10 MG/ML IJ SOLN
INTRAMUSCULAR | Status: AC
  Filled 2021-11-06: qty 1

## 2021-11-06 MED ORDER — FENTANYL CITRATE PF 50 MCG/ML IJ SOSY: 25.0000 ug | PREFILLED_SYRINGE | INTRAMUSCULAR | Status: DC | PRN

## 2021-11-06 MED ORDER — ONDANSETRON HCL 4 MG PO TABS: 4.0000 mg | ORAL_TABLET | Freq: Four times a day (QID) | ORAL | Status: DC | PRN

## 2021-11-06 MED ORDER — CEFAZOLIN SODIUM-DEXTROSE 2-4 GM/100ML-% IV SOLN
2.0000 g | Freq: Four times a day (QID) | INTRAVENOUS | Status: AC
  Administered 2021-11-06 – 2021-11-07 (×2): 2 g via INTRAVENOUS
  Filled 2021-11-06 (×2): qty 100

## 2021-11-06 MED ORDER — SODIUM CHLORIDE 0.9 % IV SOLN: INTRAVENOUS | Status: DC

## 2021-11-06 MED ORDER — ROCURONIUM BROMIDE 10 MG/ML (PF) SYRINGE
PREFILLED_SYRINGE | INTRAVENOUS | Status: DC | PRN
  Administered 2021-11-06: 50 mg via INTRAVENOUS

## 2021-11-06 SURGICAL SUPPLY — 60 items
BAG COUNTER SPONGE SURGICOUNT (BAG) ×2 IMPLANT
BLADE CLIPPER SURG (BLADE) IMPLANT
CHLORAPREP W/TINT 26 (MISCELLANEOUS) ×2 IMPLANT
COVER SURGICAL LIGHT HANDLE (MISCELLANEOUS) ×2 IMPLANT
DERMABOND ADVANCED (GAUZE/BANDAGES/DRESSINGS) ×1
DERMABOND ADVANCED .7 DNX12 (GAUZE/BANDAGES/DRESSINGS) ×2 IMPLANT
DRAPE IMP U-DRAPE 54X76 (DRAPES) ×2 IMPLANT
DRAPE SHEET LG 3/4 BI-LAMINATE (DRAPES) ×4 IMPLANT
DRAPE STERI IOBAN 125X83 (DRAPES) ×2 IMPLANT
DRAPE U-SHAPE 47X51 STRL (DRAPES) ×4 IMPLANT
DRESSING AQUACEL AG SP 3.5X10 (GAUZE/BANDAGES/DRESSINGS) IMPLANT
DRSG AQUACEL AG ADV 3.5X10 (GAUZE/BANDAGES/DRESSINGS) ×2 IMPLANT
DRSG AQUACEL AG SP 3.5X10 (GAUZE/BANDAGES/DRESSINGS) ×2
ELECT REM PT RETURN 15FT ADLT (MISCELLANEOUS) ×2 IMPLANT
EVACUATOR 1/8 PVC DRAIN (DRAIN) IMPLANT
GLOVE BIO SURGEON STRL SZ8.5 (GLOVE) ×4 IMPLANT
GLOVE BIOGEL M 7.0 STRL (GLOVE) ×2 IMPLANT
GLOVE BIOGEL PI IND STRL 7.5 (GLOVE) ×1 IMPLANT
GLOVE BIOGEL PI IND STRL 8 (GLOVE) ×1 IMPLANT
GLOVE BIOGEL PI IND STRL 8.5 (GLOVE) ×1 IMPLANT
GLOVE BIOGEL PI INDICATOR 7.5 (GLOVE) ×1
GLOVE BIOGEL PI INDICATOR 8 (GLOVE) ×1
GLOVE BIOGEL PI INDICATOR 8.5 (GLOVE) ×1
GLOVE SURG LX 7.5 STRW (GLOVE) ×2
GLOVE SURG LX STRL 7.5 STRW (GLOVE) ×2 IMPLANT
GOWN STRL REUS W/ TWL LRG LVL3 (GOWN DISPOSABLE) ×1 IMPLANT
GOWN STRL REUS W/TWL 2XL LVL3 (GOWN DISPOSABLE) ×2 IMPLANT
GOWN STRL REUS W/TWL LRG LVL3 (GOWN DISPOSABLE) ×2
HANDPIECE INTERPULSE COAX TIP (DISPOSABLE) ×2
HEAD MOD COCR 28MM HD -6MM NK (Orthopedic Implant) ×1 IMPLANT
HOOD PEEL AWAY FLYTE STAYCOOL (MISCELLANEOUS) ×6 IMPLANT
JET LAVAGE IRRISEPT WOUND (IRRIGATION / IRRIGATOR)
KIT TURNOVER KIT A (KITS) IMPLANT
LAVAGE JET IRRISEPT WOUND (IRRIGATION / IRRIGATOR) IMPLANT
MANIFOLD NEPTUNE II (INSTRUMENTS) ×2 IMPLANT
MARKER SKIN DUAL TIP RULER LAB (MISCELLANEOUS) ×2 IMPLANT
NDL SPNL 18GX3.5 QUINCKE PK (NEEDLE) ×1 IMPLANT
NEEDLE SPNL 18GX3.5 QUINCKE PK (NEEDLE) ×2 IMPLANT
NS IRRIG 1000ML POUR BTL (IV SOLUTION) ×2 IMPLANT
PACK ANTERIOR HIP CUSTOM (KITS) ×2 IMPLANT
PENCIL SMOKE EVACUATOR (MISCELLANEOUS) IMPLANT
RINGBLOC BI POLAR 28X41MM (Orthopedic Implant) ×1 IMPLANT
SAW OSC TIP CART 19.5X105X1.3 (SAW) ×2 IMPLANT
SEALER BIPOLAR AQUA 6.0 (INSTRUMENTS) ×2 IMPLANT
SET HNDPC FAN SPRY TIP SCT (DISPOSABLE) ×1 IMPLANT
STAPLER VISISTAT 35W (STAPLE) ×1 IMPLANT
STEM FEM CMTLS HO 9X137 123D (Stem) ×1 IMPLANT
SUT ETHIBOND NAB CT1 #1 30IN (SUTURE) ×4 IMPLANT
SUT MNCRL AB 3-0 PS2 18 (SUTURE) ×2 IMPLANT
SUT MON AB 2-0 CT1 36 (SUTURE) ×2 IMPLANT
SUT STRATAFIX PDO 1 14 VIOLET (SUTURE) ×2
SUT STRATFX PDO 1 14 VIOLET (SUTURE) ×1
SUT VIC AB 1 CT1 27 (SUTURE) ×2
SUT VIC AB 1 CT1 27XBRD ANTBC (SUTURE) ×1 IMPLANT
SUT VIC AB 2-0 CT1 27 (SUTURE) ×2
SUT VIC AB 2-0 CT1 TAPERPNT 27 (SUTURE) ×1 IMPLANT
SUTURE STRATFX PDO 1 14 VIOLET (SUTURE) ×1 IMPLANT
TRAY FOLEY MTR SLVR 16FR STAT (SET/KITS/TRAYS/PACK) IMPLANT
TUBE SUCTION HIGH CAP CLEAR NV (SUCTIONS) ×2 IMPLANT
WATER STERILE IRR 1000ML POUR (IV SOLUTION) ×4 IMPLANT

## 2021-11-06 NOTE — TOC Progression Note (Signed)
Transition of Care [facility]) - Progression Note    Patient Details  Name: Tracy [name] MRN: [id number] Date of Birth: 1920/03/26  Transition of Care [facility]) CM/SW Contact  Gregary Blackard, Juliann Pulse, RN Phone Number: 11/06/2021, 3:10 PM  Clinical Narrative: s/p R hip Hemi await PT eval & recc.      Expected Discharge Plan:  (TBD) Barriers to Discharge: Continued Medical Work up  Expected Discharge Plan and Services Expected Discharge Plan:  (TBD)   Discharge Planning Services: CM Consult   Living arrangements for the past 2 months: Single Family Home                                       Social Determinants of Health (SDOH) Interventions    Readmission Risk Interventions     No data to display

## 2021-11-06 NOTE — Anesthesia Procedure Notes (Signed)
Procedure Name: Intubation Date/Time: 11/06/2021 11:30 AM  Performed by: Raenette Rover, CRNAPre-anesthesia Checklist: Patient identified, Emergency Drugs available, Suction available and Patient being monitored Patient Re-evaluated:Patient Re-evaluated prior to induction Oxygen Delivery Method: Circle system utilized Preoxygenation: Pre-oxygenation with 100% oxygen Induction Type: IV induction Ventilation: Mask ventilation without difficulty Laryngoscope Size: Mac and 3 Grade View: Grade I Tube type: Oral Tube size: 7.0 mm Number of attempts: 2 Airway Equipment and Method: Stylet Placement Confirmation: ETT inserted through vocal cords under direct vision, positive ETCO2 and breath sounds checked- equal and bilateral Secured at: 20 cm Tube secured with: Tape Dental Injury: Teeth and Oropharynx as per pre-operative assessment  Comments: DL x1 with Miller 2 and attempted blind intubation without success. Easy mask ventilation. DL x1 with MAC 3 and cricoid pressure--Grade 1 view and successful intubation.

## 2021-11-06 NOTE — Care Management Important Message (Signed)
Important Message  Patient Details IM Letter placed in Patients room. Name: Tracy [name] MRN: [id number] Date of Birth: 11-10-1919   Medicare Important Message Given:  Yes     Kerin Salen 11/06/2021, 12:34 PM

## 2021-11-06 NOTE — Transfer of Care (Signed)
Immediate Anesthesia Transfer of Care Note  Patient: Tracy [name]  Procedure(s) Performed: ANTERIOR APPROACH HEMI HIP ARTHROPLASTY (Right)  Patient Location: PACU  Anesthesia Type:General  Level of Consciousness: awake, drowsy and patient cooperative  Airway & Oxygen Therapy: Patient Spontanous Breathing and Patient connected to face mask oxygen  Post-op Assessment: Report given to RN and Post -op Vital signs reviewed and stable  Post vital signs: Reviewed and stable  Last Vitals:  Vitals Value Taken Time  BP 177/74 11/06/21 1315  Temp    Pulse 107 11/06/21 1318  Resp 16 11/06/21 1318  SpO2 100 % 11/06/21 1318  Vitals shown include unvalidated device data.  Last Pain:  Vitals:   11/06/21 1029  TempSrc: Oral  PainSc: 0-No pain         Complications: No notable events documented.

## 2021-11-06 NOTE — Op Note (Signed)
OPERATIVE REPORT  SURGEON: Rod Can, MD   ASSISTANT: Nehemiah Massed, PA-C  PREOPERATIVE DIAGNOSIS: Displaced Right femoral neck fracture.   POSTOPERATIVE DIAGNOSIS: Displaced Right femoral neck fracture.   PROCEDURE: Right hip hemiarthroplasty, anterior approach.   IMPLANTS: Biomet Taperloc Complete Reduced Distal stem, size 9x137 mm, high offset, with a 28-6 mm metal head ball and a 41 mm bipolar articulation.  ANESTHESIA:  General  ANTIBIOTICS: 2g ancef.  ESTIMATED BLOOD LOSS:-150 mL    DRAINS: None.  COMPLICATIONS: None   CONDITION: PACU - hemodynamically stable.   BRIEF CLINICAL NOTE: Tracy [name] is a 88 y.o. female with a displaced Right femoral neck fracture. The patient was admitted to the hospitalist service and underwent perioperative risk stratification and medical optimization. Eliquis was allowed to washout, and heparin drip was stopped 6 hours prior to surgery.The risks, benefits, and alternatives to hemiarthroplasty were explained, and the patient elected to proceed.  PROCEDURE IN DETAIL: The patient was taken to the operating room and general anesthesia was induced on the hospital bed.  The patient was then positioned on the Hana table.  All bony prominences were well padded.  The hip was prepped and draped in the normal sterile surgical fashion.  A time-out was called verifying side and site of surgery. Antibiotics were given within 60 minutes of beginning the procedure.   Bikini incision was made, and the direct anterior approach to the hip was performed through the Hueter interval.  Superficial dissection was carried out lateral to the ASIS. Lateral femoral circumflex vessels were treated with the Auqumantys. The anterior capsule was exposed and an inverted T capsulotomy was made. Fracture hematoma was encountered and evacuated. The patient was found to have a comminuted Right subcapital femoral neck fracture.  Inferior pubofemoral ligament was released  subperiosteally to the lesser trochanter. I freshened the femoral neck cut with a saw.  I removed the femoral neck fragment.  A corkscrew was placed into the head and the head was removed.  This was passed to the back table and was measured.   Acetabular exposure was achieved.  I examined the articular cartilage which was intact.  The labrum was intact. A 41 mm trial head was placed and found to have excellent fit.   I then gained femoral exposure taking care to protect the abductors and greater trochanter.  The superior capsule was incised longitudinally, staying lateral to the posterior border of the femoral neck. External rotation, extension, and adduction were applied.  A cookie cutter was used to enter the femoral canal, and then the femoral canal finder was used to confirm location.  I then sequentially broached up to a size 9.  Calcar planer was used on the femoral neck remnant.  I placed a high offset neck and a 41 mm bipolar trial construct. The hip was reduced.  Leg lengths were checked fluoroscopically.  The hip was dislocated and trial components were removed.  I placed the real stem followed by the bipolar construct.  A single reduction maneuver was performed and the hip was reduced.  Fluoroscopy was used to confirm component position and leg lengths.  At 90 degrees of external rotation and extension, the hip was stable to an anterior directed force.   The wound was copiously irrigated with Prontosan solution and normal saline using pulse lavage.  Marcaine solution was injected into the periarticular soft tissue.  The wound was closed in layers using #1 Stratafix for the fascia, 2-0 Vicryl for the subcutaneous fat, 2-0 Monocryl  for the deep dermal layer, and staples + Dermabond for the skin.  Once the glue was fully dried, an Aquacell Ag dressing was applied.  The patient was then awakened from anesthesia and transported to the recovery room in stable condition.  Sponge, needle, and instrument  counts were correct at the end of the case x2.  The patient tolerated the procedure well and there were no known complications.  Please note that a surgical assistant was a medical necessity for this procedure to perform it in a safe and expeditious manner. Assistant was necessary to provide appropriate retraction of vital neurovascular structures, to prevent femoral fracture, and to allow for anatomic placement of the prosthesis.  POSTOPERATIVE PLAN: The patient be readmitted to the hospitalist service.  She may weight-bear as tolerated right lower extremity with a walker.  Tomorrow morning, we will resume Eliquis at 2.5 mg p.o. twice daily.  After 48 to 72 hours, her regular home dose can be resumed.  Mobilize out of bed with PT/OT.  She will undergo disposition planning.  Follow-up in the office in 2 weeks for routine postop care.

## 2021-11-06 NOTE — Anesthesia Preprocedure Evaluation (Addendum)
Anesthesia Evaluation  Patient identified by MRN, date of birth, ID band Patient awake    Reviewed: Allergy & Precautions, NPO status , Patient's Chart, lab work & pertinent test results  Airway Mallampati: II  TM Distance: >3 FB Neck ROM: Full    Dental no notable dental hx.    Pulmonary neg pulmonary ROS,    Pulmonary exam normal breath sounds clear to auscultation       Cardiovascular hypertension, + DVT   Rhythm:Regular Rate:Tachycardia     Neuro/Psych Dementia CVA    GI/Hepatic negative GI ROS, Neg liver ROS,   Endo/Other  negative endocrine ROS  Renal/GU Renal InsufficiencyRenal disease  negative genitourinary   Musculoskeletal negative musculoskeletal ROS (+)   Abdominal   Peds negative pediatric ROS (+)  Hematology  (+) Blood dyscrasia, anemia ,   Anesthesia Other Findings   Reproductive/Obstetrics negative OB ROS                           Anesthesia Physical Anesthesia Plan  ASA: 4  Anesthesia Plan: General   Post-op Pain Management: Minimal or no pain anticipated   Induction: Intravenous  PONV Risk Score and Plan: 3 and Ondansetron, Dexamethasone and Treatment may vary due to age or medical condition  Airway Management Planned: Oral ETT  Additional Equipment:   Intra-op Plan:   Post-operative Plan: Extubation in OR  Informed Consent: I have reviewed the patients History and Physical, chart, labs and discussed the procedure including the risks, benefits and alternatives for the proposed anesthesia with the patient or authorized representative who has indicated his/her understanding and acceptance.     Dental advisory given  Plan Discussed with: CRNA and Surgeon  Anesthesia Plan Comments:         Anesthesia Quick Evaluation

## 2021-11-06 NOTE — Progress Notes (Signed)
PROGRESS NOTE   Tracy [name]  ZOX:[id number]    DOB: 24-Jan-1920    DOA: 11/03/2021  PCP: Charlane Ferretti, MD    Brief Narrative:  A pleasant 88 year old female, PMH significant for stage IV CKD with anemia, HTN, recently hospitalized from 09/15/2021 - 10/14/2021 for a left femoral neck fracture, underwent left hip hemiarthroplasty with Dr. Lyla Glassing on 6/12.  She then had a seizure-like activity on 11/15/9145, with an embolic stroke noted on 12/04/5619 and also diagnosed with left femoral vein DVT on 6/15 for which she was taken off DAPT and placed on Eliquis, now presented to the [facility] ED on 11/03/2021 following an accidental fall when she fell backwards hitting her head while trying to pick up something up off the floor, no LOC, suffered a laceration to the occipital scalp.  No LOC.  Admitted for right femoral neck fracture, orthopedics consulted and plan surgery on 8/2 pending Eliquis washout.  Currently on IV heparin bridging.  Hemoglobin down to 7.2, transfusing 1 unit PRBC 8/1.   Assessment & Plan:   Right hip fracture/displaced right femoral neck fracture: Sustained post mechanical fall.  History of left hip hemiarthroplasty for fracture on 09/16/2021.   Plan is for surgery today.  Waiting for Eliquis to washout.   Patient complains of left hip pain this morning.  We will go ahead and do films of that hip as well since this has not been done during this admission yet. Leukocytosis likely reactive.  Continue to monitor.  Check UA.  History of femoral DVT 09/18/21 (deep vein thrombosis) Last dose of Eliquis 11/03/2021 at 11 AM.  Eliquis now on hold.  Was on IV heparin bridging till early this morning.  To be resumed based on orthopedic input after surgery.   Essential hypertension Elevated blood pressure readings probably due to pain.  Continue to monitor.  Noted to be on amlodipine prior to admission which is currently on hold.  Can be resumed from tomorrow.   Acute blood loss  anemia complicating anemia due to stage 4 chronic kidney disease (HCC) Baseline hemoglobin recently has been fluctuating in the 8-9 range.  Presented with hemoglobin of 9.3.  Hemoglobin has gradually drifted down, 9.3 > 8.3 > 7.2, suspected due to fracture site bleeding.   No other overt bleeding was noted. Patient transfused 1 unit of PRBC.  Hemoglobin has responded and noted to be stable this morning at 9.  Continue to monitor.     Hyperlipidemia, unspecified Continue atorvastatin   History of repair of left hip joint June 2023 Complains of pain in the left hip this morning.  Has not been evaluated during this admission.  Will get x-ray.   History of seizure 09/16/21 Continue Keppra.  Seizure precautions.   History of embolic stroke 06/13/63 Patient was currently on Eliquis so not on DAPT in order to decrease bleeding risk Eliquis on hold.  On heparin bridge now.  Postop, resume Eliquis when cleared by orthopedics.   Laceration of occipital scalp Related to fall.  Wound care   Chronic kidney disease, stage 4 (severe) (HCC) Baseline serum creatinine probably in the 1.7-1.9 range.  Creatinine stable for the most part.  Monitor urine output.  Avoid nephrotoxic agents.   Vascular dementia ([place]) Delirium precautions   Accidental fall According to niece, patient was walking without her walker when she tried to pick something up and fell backwards hitting her head.  There was no loss of consciousness.  Patient remained awake and alert  and was able to call her niece after the fall  Pleural effusions on CT Associated atelectasis.  Also has 1-2+ leg edema.  Given a dose of IV Lasix.  Monitor closely.  Follow clinically.  Nutrition Status: Nutrition Problem: Moderate Malnutrition Etiology: chronic illness (CKD4) Signs/Symptoms: mild fat depletion, moderate muscle depletion Interventions: Ensure Enlive (each supplement provides 350kcal and 20 grams of protein), MVI    DVT prophylaxis:  SCDs Start: 11/03/21 2105.  IV heparin drip as noted above.   Code Status: DNR:  Family Communication: Discussed in detail with patient's niece, HCPOA on the phone, updated care and answered all questions. Disposition: To be determined   Status is: Inpatient Remains inpatient appropriate because: IV heparin drip, upcoming right hip hemiarthroplasty planned for 8/2.     Consultants:   Orthopedics/Dr. Lyla Glassing  Procedures:   None  Antimicrobials:   None   Subjective:  Patient pleasantly confused.  Complains of pain in the left hip.  Denies any chest pain shortness of breath.  Objective:   Vitals:   11/05/21 1408 11/05/21 1656 11/05/21 2039 11/06/21 0452  BP: (!) 154/70 (!) 149/78 (!) 148/74 (!) 151/93  Pulse: (!) 101 (!) 103 (!) 110 (!) 107  Resp: 17 19 20 18   Temp: 99.3 F (37.4 C) 99.5 F (37.5 C) 98.5 F (36.9 C) 100.3 F (37.9 C)  TempSrc: Oral Oral Oral Oral  SpO2: 100%  98% 100%  Weight:      Height:        General appearance: Awake alert.  In no distress Resp: Clear to auscultation bilaterally.  Normal effort Cardio: S1-S2 is normal regular.  No S3-S4.  No rubs murmurs or bruit GI: Abdomen is soft.  Nontender nondistended.  Bowel sounds are present normal.  No masses organomegaly Extremities: Noted range of motion of both of the lower extremities Neurologic:  No focal neurological deficits.     Data Reviewed:     CBC: Recent Labs  Lab 11/03/21 1608 11/04/21 0612 11/05/21 0404 11/05/21 2028 11/06/21 0417  WBC 10.2   < > 12.4* 14.8* 15.1*  NEUTROABS 6.7  --   --   --   --   HGB 9.3*   < > 7.2* 9.7* 9.0*  HCT 29.5*   < > 25.2* 29.4* 27.9*  MCV 87.5   < > 97.3 84.0 86.1  PLT 436*   < > 362 349 310   < > = values in this interval not displayed.     Basic Metabolic Panel: Recent Labs  Lab 11/03/21 1608 11/04/21 0612 11/05/21 0404 11/06/21 0417  NA 140 141 140 139  K 4.2 4.7 4.2 4.3  CL 112* 112* 112* 110  CO2 21* 22 18* 23  GLUCOSE  115* 102* 113* 114*  BUN 25* 25* 24* 28*  CREATININE 1.98* 1.74* 1.61* 1.87*  CALCIUM 9.0 8.7* 7.9* 7.9*     Liver Function Tests: Recent Labs  Lab 11/03/21 1608  AST 21  ALT 17  ALKPHOS 111  BILITOT 0.6  PROT 7.3  ALBUMIN 3.3*     CBG: No results for input(s): "GLUCAP" in the last 168 hours.  Microbiology Studies:   Recent Results (from the past 240 hour(s))  Surgical pcr screen     Status: None   Collection Time: 11/05/21  6:31 PM   Specimen: Nasal Mucosa; Nasal Swab  Result Value Ref Range Status   MRSA, PCR NEGATIVE NEGATIVE Final   Staphylococcus aureus NEGATIVE NEGATIVE Final    Comment: (NOTE)  The Xpert SA Assay (FDA approved for NASAL specimens in patients 77 years of age and older), is one component of a comprehensive surveillance program. It is not intended to diagnose infection nor to guide or monitor treatment. Performed at [facility], [place] [address], [place], [place] [address]     Radiology Studies:  No results found.  Scheduled Meds:    acetaminophen  1,000 mg Oral Once   [MAR Hold] allopurinol  200 mg Oral q AM   [MAR Hold] atorvastatin  20 mg Oral Daily   chlorhexidine  60 mL Topical Once   [MAR Hold] feeding supplement  237 mL Oral BID BM   [MAR Hold] levETIRAcetam  250 mg Oral Daily   [MAR Hold] memantine  5 mg Oral BID   [MAR Hold] multivitamin with minerals  1 tablet Oral Daily   [MAR Hold] pantoprazole  40 mg Oral QAC breakfast   povidone-iodine  2 Application Topical Once   povidone-iodine  2 Application Topical Once    Continuous Infusions:     ceFAZolin (ANCEF) IV     tranexamic acid       LOS: 3 days     Bonnielee Haff, MD,    11/06/2021, 10:25 AM

## 2021-11-06 NOTE — Interval H&P Note (Signed)
History and Physical Interval Note:  11/06/2021 10:24 AM  Tracy [name]  has presented today for surgery, with the diagnosis of RIGHT FEMUR FRACTURE.  The various methods of treatment have been discussed with the patient and family. After consideration of risks, benefits and other options for treatment, the patient has consented to  Procedure(s): ANTERIOR APPROACH HEMI HIP ARTHROPLASTY (Right) as a surgical intervention.  The patient's history has been reviewed, patient examined, no change in status, stable for surgery.  I have reviewed the patient's chart and labs.  Questions were answered to the patient's satisfaction.     Tracy [name] Tracy [name]

## 2021-11-06 NOTE — Discharge Instructions (Signed)
? ?Dr. Gresia Isidoro ?Joint Replacement Specialist ?[facility] ?[address] ?[place], [place] [address] ?[phone] ? ? ?TOTAL HIP REPLACEMENT POSTOPERATIVE DIRECTIONS ? ? ? ?Hip Rehabilitation, Guidelines Following Surgery  ? ?WEIGHT BEARING ?Weight bearing as tolerated with assist device (walker, cane, etc) as directed, use it as long as suggested by your surgeon or therapist, typically at least 4-6 weeks. ? ?The results of a hip operation are greatly improved after range of motion and muscle strengthening exercises. Follow all safety measures which are given to protect your hip. If any of these exercises cause increased pain or swelling in your joint, decrease the amount until you are comfortable again. Then slowly increase the exercises. Call your caregiver if you have problems or questions.  ? ?HOME CARE INSTRUCTIONS  ?Most of the following instructions are designed to prevent the dislocation of your new hip.  ?Remove items at home which could result in a fall. This includes throw rugs or furniture in walking pathways.  ?Continue medications as instructed at time of discharge. ?You may have some home medications which will be placed on hold until you complete the course of blood thinner medication. ?You may start showering once you are discharged home. Do not remove your dressing. ?Do not put on socks or shoes without following the instructions of your caregivers.   ?Sit on chairs with arms. Use the chair arms to help push yourself up when arising.  ?Arrange for the use of a toilet seat elevator so you are not sitting low.  ?Walk with walker as instructed.  ?You may resume a sexual relationship in one month or when given the OK by your caregiver.  ?Use walker as long as suggested by your caregivers.  ?You may put full weight on your legs and walk as much as is comfortable. ?Avoid periods of inactivity such as sitting longer than an hour when not asleep. This helps prevent blood  clots.  ?You may return to work once you are cleared by your surgeon.  ?Do not drive a car for 6 weeks or until released by your surgeon.  ?Do not drive while taking narcotics.  ?Wear elastic stockings for two weeks following surgery during the day but you may remove then at night.  ?Make sure you keep all of your appointments after your operation with all of your doctors and caregivers. You should call the office at the above phone number and make an appointment for approximately two weeks after the date of your surgery. ?Please pick up a stool softener and laxative for home use as long as you are requiring pain medications. ?ICE to the affected hip every three hours for 30 minutes at a time and then as needed for pain and swelling. Continue to use ice on the hip for pain and swelling from surgery. You may notice swelling that will progress down to the foot and ankle.  This is normal after surgery.  Elevate the leg when you are not up walking on it.   ?It is important for you to complete the blood thinner medication as prescribed by your doctor. ?Continue to use the breathing machine which will help keep your temperature down.  It is common for your temperature to cycle up and down following surgery, especially at night when you are not up moving around and exerting yourself.  The breathing machine keeps your lungs expanded and your temperature down. ? ?RANGE OF MOTION AND STRENGTHENING EXERCISES  ?These exercises are designed to help you   keep full movement of your hip joint. Follow your caregiver's or physical therapist's instructions. Perform all exercises about fifteen times, three times per day or as directed. Exercise both hips, even if you have had only one joint replacement. These exercises can be done on a training (exercise) mat, on the floor, on a table or on a bed. Use whatever works the best and is most comfortable for you. Use music or television while you are exercising so that the exercises are a  pleasant break in your day. This will make your life better with the exercises acting as a break in routine you can look forward to.  ?Lying on your back, slowly slide your foot toward your buttocks, raising your knee up off the floor. Then slowly slide your foot back down until your leg is straight again.  ?Lying on your back spread your legs as far apart as you can without causing discomfort.  ?Lying on your side, raise your upper leg and foot straight up from the floor as far as is comfortable. Slowly lower the leg and repeat.  ?Lying on your back, tighten up the muscle in the front of your thigh (quadriceps muscles). You can do this by keeping your leg straight and trying to raise your heel off the floor. This helps strengthen the largest muscle supporting your knee.  ?Lying on your back, tighten up the muscles of your buttocks both with the legs straight and with the knee bent at a comfortable angle while keeping your heel on the floor.  ? ?SKILLED REHAB INSTRUCTIONS: ?If the patient is transferred to a skilled rehab facility following release from the hospital, a list of the current medications will be sent to the facility for the patient to continue.  When discharged from the skilled rehab facility, please have the facility set up the patient's Home Health Physical Therapy prior to being released. Also, the skilled facility will be responsible for providing the patient with their medications at time of release from the facility to include their pain medication and their blood thinner medication. If the patient is still at the rehab facility at time of the two week follow up appointment, the skilled rehab facility will also need to assist the patient in arranging follow up appointment in our office and any transportation needs. ? ?POST-OPERATIVE OPIOID TAPER INSTRUCTIONS: ?It is important to wean off of your opioid medication as soon as possible. If you do not need pain medication after your surgery it is ok  to stop day one. ?Opioids include: ?Codeine, Hydrocodone(Norco, Vicodin), Oxycodone(Percocet, oxycontin) and hydromorphone amongst others.  ?Long term and even short term use of opiods can cause: ?Increased pain response ?Dependence ?Constipation ?Depression ?Respiratory depression ?And more.  ?Withdrawal symptoms can include ?Flu like symptoms ?Nausea, vomiting ?And more ?Techniques to manage these symptoms ?Hydrate well ?Eat regular healthy meals ?Stay active ?Use relaxation techniques(deep breathing, meditating, yoga) ?Do Not substitute Alcohol to help with tapering ?If you have been on opioids for less than two weeks and do not have pain than it is ok to stop all together.  ?Plan to wean off of opioids ?This plan should start within one week post op of your joint replacement. ?Maintain the same interval or time between taking each dose and first decrease the dose.  ?Cut the total daily intake of opioids by one tablet each day ?Next start to increase the time between doses. ?The last dose that should be eliminated is the evening dose.  ? ? ?MAKE   SURE YOU:  ?Understand these instructions.  ?Will watch your condition.  ?Will get help right away if you are not doing well or get worse. ? ?Pick up stool softner and laxative for home use following surgery while on pain medications. ?Do not remove your dressing. ?The dressing is waterproof--it is OK to take showers. ?Continue to use ice for pain and swelling after surgery. ?Do not use any lotions or creams on the incision until instructed by your surgeon. ?Total Hip Protocol. ? ?

## 2021-11-07 DIAGNOSIS — S72001A Fracture of unspecified part of neck of right femur, initial encounter for closed fracture: Secondary | ICD-10-CM | POA: Diagnosis not present

## 2021-11-07 DIAGNOSIS — Z86718 Personal history of other venous thrombosis and embolism: Secondary | ICD-10-CM | POA: Diagnosis not present

## 2021-11-07 DIAGNOSIS — N184 Chronic kidney disease, stage 4 (severe): Secondary | ICD-10-CM | POA: Diagnosis not present

## 2021-11-07 DIAGNOSIS — N179 Acute kidney failure, unspecified: Secondary | ICD-10-CM

## 2021-11-07 DIAGNOSIS — I10 Essential (primary) hypertension: Secondary | ICD-10-CM | POA: Diagnosis not present

## 2021-11-07 LAB — URINALYSIS, ROUTINE W REFLEX MICROSCOPIC
Bilirubin Urine: NEGATIVE
Glucose, UA: NEGATIVE mg/dL
Hgb urine dipstick: NEGATIVE
Ketones, ur: NEGATIVE mg/dL
Leukocytes,Ua: NEGATIVE
Nitrite: NEGATIVE
Protein, ur: 30 mg/dL — AB
Specific Gravity, Urine: 1.011 (ref 1.005–1.030)
pH: 5 (ref 5.0–8.0)

## 2021-11-07 LAB — CBC
HCT: 27.3 % — ABNORMAL LOW (ref 36.0–46.0)
Hemoglobin: 9.1 g/dL — ABNORMAL LOW (ref 12.0–15.0)
MCH: 28.4 pg (ref 26.0–34.0)
MCHC: 33.3 g/dL (ref 30.0–36.0)
MCV: 85.3 fL (ref 80.0–100.0)
Platelets: 281 10*3/uL (ref 150–400)
RBC: 3.2 MIL/uL — ABNORMAL LOW (ref 3.87–5.11)
RDW: 14 % (ref 11.5–15.5)
WBC: 23.3 10*3/uL — ABNORMAL HIGH (ref 4.0–10.5)
nRBC: 0 % (ref 0.0–0.2)

## 2021-11-07 LAB — BASIC METABOLIC PANEL
Anion gap: 8 (ref 5–15)
BUN: 33 mg/dL — ABNORMAL HIGH (ref 8–23)
CO2: 22 mmol/L (ref 22–32)
Calcium: 8 mg/dL — ABNORMAL LOW (ref 8.9–10.3)
Chloride: 107 mmol/L (ref 98–111)
Creatinine, Ser: 2.17 mg/dL — ABNORMAL HIGH (ref 0.44–1.00)
GFR, Estimated: 20 mL/min — ABNORMAL LOW (ref >60)
Glucose, Bld: 134 mg/dL — ABNORMAL HIGH (ref 70–99)
Potassium: 4.8 mmol/L (ref 3.5–5.1)
Sodium: 137 mmol/L (ref 135–145)

## 2021-11-07 MED ORDER — SODIUM CHLORIDE 0.45 % IV SOLN: INTRAVENOUS | Status: DC

## 2021-11-07 MED ORDER — AMLODIPINE BESYLATE 10 MG PO TABS
10.0000 mg | ORAL_TABLET | Freq: Every day | ORAL | Status: DC
  Administered 2021-11-07 – 2021-11-10 (×4): 10 mg via ORAL
  Filled 2021-11-07 (×4): qty 1

## 2021-11-07 MED ORDER — ACETAMINOPHEN 500 MG PO TABS: 1000.0000 mg | ORAL_TABLET | Freq: Three times a day (TID) | ORAL | 0 refills | Status: AC

## 2021-11-07 MED ORDER — OXYCODONE HCL 5 MG PO TABS: 5.0000 mg | ORAL_TABLET | Freq: Four times a day (QID) | ORAL | 0 refills | Status: AC | PRN

## 2021-11-07 NOTE — TOC Progression Note (Signed)
Transition of Care [facility]) - Progression Note    Patient Details  Name: GRENDA LORA MRN: [id number] Date of Birth: 05-01-1919  [facility]) CM/SW Contact  Kailo Kosik, Juliann Pulse, RN Phone Number: 11/07/2021, 3:08 PM  Clinical Narrative:  spoke to Niece Deborah-agreed to fax out for SNF-await bed offers, prior auth.     Expected Discharge Plan: [place] Barriers to Discharge: Continued Medical Work up  Expected Discharge Plan and Services Expected Discharge Plan: [place]   Discharge Planning Services: CM Consult   Living arrangements for the past 2 months: Single Family Home                                       Social Determinants of Health (SDOH) Interventions    Readmission Risk Interventions     No data to display

## 2021-11-07 NOTE — Evaluation (Signed)
Physical Therapy Evaluation Patient Details Name: Tracy [name] MRN: [id number] DOB: [birth year] Today's Date: 11/07/2021  History of Present Illness  Pt is an 88 year old female who presented to the [facility] ED on 11/03/2021 following an accidental fall when she fell backwards hitting her head while trying to pick up something up off the floor, no LOC, suffered a laceration to the occipital scalp and admitted for right femoral neck fracture s/p right hip hemiarthroplasty on 11/06/21. PMH significant for stage IV CKD with anemia, HTN, recently hospitalized from 09/15/2021 - 10/14/2021 for a left femoral neck fracture, underwent left hip hemiarthroplasty with Dr. Lyla Glassing on 6/12.  She then had a seizure-like activity on 5/88/3254, with an embolic stroke noted on 9/82/6415 and also diagnosed with left femoral vein DVT on 6/15 for which she was taken off DAPT and placed on Eliquis now  Clinical Impression  Patient is s/p above surgery resulting in functional limitations due to the deficits listed below (see PT Problem List).  Patient will benefit from skilled PT to increase their independence and safety with mobility to allow discharge to the venue listed below.  Pt assisted with standing and transfer to recliner.  Pt requiring mod-max assist for mobility at this time and not yet able to ambulate.  Pt would benefit from post acute rehab upon d/c.        Recommendations for follow up therapy are one component of a multi-disciplinary discharge planning process, led by the attending physician.  Recommendations may be updated based on patient status, additional functional criteria and insurance authorization.  Follow Up Recommendations Skilled nursing-short term rehab (<3 hours/day) Can patient physically be transported by private vehicle: No    Assistance Recommended at Discharge Frequent or constant Supervision/Assistance  Patient can return home with the following  A lot of help with walking  and/or transfers;A lot of help with bathing/dressing/bathroom;Assist for transportation;Help with stairs or ramp for entrance    Equipment Recommendations None recommended by PT  Recommendations for Other Services       Functional Status Assessment Patient has had a recent decline in their functional status and demonstrates the ability to make significant improvements in function in a reasonable and predictable amount of time.     Precautions / Restrictions Precautions Precautions: Fall Precaution Comments: no hip precautions Restrictions Weight Bearing Restrictions: No Other Position/Activity Restrictions: WBAT      Mobility  Bed Mobility Overal bed mobility: Needs Assistance Bed Mobility: Supine to Sit     Supine to sit: Max assist, HOB elevated     General bed mobility comments: assist for bil LEs over EOB and trunk upright    Transfers Overall transfer level: Needs assistance Equipment used: Rolling walker (2 wheels) Transfers: Sit to/from Stand, Bed to chair/wheelchair/BSC Sit to Stand: Mod assist   Step pivot transfers: Mod assist       General transfer comment: verbal cues for hand placement, assist to rise, steady and control descent    Ambulation/Gait               General Gait Details: unable at this time  Stairs            Wheelchair Mobility    Modified Rankin (Stroke Patients Only)       Balance Overall balance assessment: History of Falls  Pertinent Vitals/Pain Pain Assessment Pain Assessment: Faces Faces Pain Scale: Hurts little more Pain Location: hip/thigh with movement, none at rest Pain Descriptors / Indicators: Grimacing, Operative site guarding Pain Intervention(s): Monitored during session, Repositioned, RN gave pain meds during session    [place] expects to be discharged to:: Private residence Living Arrangements: Alone Available Help  at Discharge: Family;Personal care attendant Type of Home: House Home Access: Stairs to enter Entrance Stairs-Rails: Psychiatric nurse of Steps: 2   Home Layout: One level Home Equipment: Shower seat - built Medical sales representative (2 wheels)      Prior Function Prior Level of Function : Independent/Modified Independent;History of Falls (last six months)             Mobility Comments: states she doesn't always use RW       Hand Dominance        Extremity/Trunk Assessment        Lower Extremity Assessment Lower Extremity Assessment: Generalized weakness;RLE deficits/detail RLE Deficits / Details: requiring assist due to pain       Communication   Communication: HOH  Cognition Arousal/Alertness: Awake/alert Behavior During Therapy: WFL for tasks assessed/performed Overall Cognitive Status: Within Functional Limits for tasks assessed                                          General Comments      Exercises     Assessment/Plan    PT Assessment Patient needs continued PT services  PT Problem List Decreased strength;Decreased mobility;Decreased balance;Decreased knowledge of use of DME;Decreased activity tolerance;Pain       PT Treatment Interventions Gait training;DME instruction;Therapeutic exercise;Functional mobility training;Therapeutic activities;Patient/family education;Balance training    PT Goals (Current goals can be found in the Care Plan section)       Frequency Min 2X/week     Co-evaluation               AM-PAC PT "6 Clicks" Mobility  Outcome Measure Help needed turning from your back to your side while in a flat bed without using bedrails?: A Lot Help needed moving from lying on your back to sitting on the side of a flat bed without using bedrails?: A Lot Help needed moving to and from a bed to a chair (including a wheelchair)?: A Lot Help needed standing up from a chair using your arms (e.g., wheelchair  or bedside chair)?: A Lot Help needed to walk in hospital room?: Total Help needed climbing 3-5 steps with a railing? : Total 6 Click Score: 10    End of Session Equipment Utilized During Treatment: Gait belt Activity Tolerance: Patient tolerated treatment well Patient left: in chair;with call bell/phone within reach;with chair alarm set Nurse Communication: Mobility status PT Visit Diagnosis: Other abnormalities of gait and mobility (R26.89)    Time: 7371-0626 PT Time Calculation (min) (ACUTE ONLY): 20 min   Charges:   PT Evaluation $PT Eval Low Complexity: 1 Low        Kati PT, DPT Physical Therapist Acute Rehabilitation Services Preferred contact method: Secure Chat Weekend Pager Only: [phone] Office: [place] 11/07/2021, 12:06 PM

## 2021-11-07 NOTE — NC FL2 (Signed)
[place] LEVEL OF CARE SCREENING TOOL     IDENTIFICATION  Patient Name: Tracy [name] Birthdate: January 19, 1920 Sex: female Admission Date (Current Location): 11/03/2021  [facility] and [place] Number:  Herbalist and Address:  [facility],  [address] [place], [place]      Provider Number: [id number]  Attending Physician Name and Address:  Bonnielee Haff, MD  Relative Name and Phone Number:   [name] [place](Niece)[phone])    Current Level of Care: Hospital Recommended Level of Care: [place] Prior Approval Number:    Date Approved/Denied:   PASRR Number:  [id number])  Discharge Plan: SNF    Current Diagnoses: Patient Active Problem List   Diagnosis Date Noted   Malnutrition of moderate degree 11/04/2021   History of embolic stroke 10/07/48 09/38/1829   Moderate vascular dementia with agitation ([place]) 11/03/2021   Closed right hip fracture ([place]) 11/03/2021   Acute deep vein thrombosis (DVT) of left femoral vein ([place]) 93/71/6967   Acute embolic stroke ([place]) 89/38/1017   Witnessed seizure-like activity ([place]) 09/17/2021   Osteoporosis 09/17/2021   Left homonymous hemianopsia 09/17/2021   Vascular dementia ([place]) 09/16/2021   Displaced fracture of left femoral neck ([place]) 09/15/2021   Accidental fall 09/15/2021   Essential hypertension 01/30/2021   Anemia due to stage 4 chronic kidney disease ([place]) 01/30/2021   Chronic renal failure syndrome 01/30/2021   Hyperlipidemia, unspecified 01/30/2021   Chronic kidney disease, stage 4 (severe) ([employer]) 01/30/2021   Closed right hip fracture, initial encounter ([place]) 01/30/2021   Laceration of occipital scalp 01/30/2021   History of femoral DVT 09/18/21 (deep vein thrombosis) 01/30/2021   History of seizure 09/16/21 01/30/2021   History of repair of left hip joint June 2023 01/30/2021    Orientation RESPIRATION BLADDER Height & Weight     Self, Situation, Place  (forgetfulness)  Normal Incontinent Weight: 42 kg Height:  4\' 9"  (144.8 cm)  BEHAVIORAL SYMPTOMS/MOOD NEUROLOGICAL BOWEL NUTRITION STATUS      Incontinent Diet (Regular)  AMBULATORY STATUS COMMUNICATION OF NEEDS Skin   Limited Assist Verbally Surgical wounds (R hip surgical incision)                       Personal Care Assistance Level of Assistance  Bathing, Feeding, Dressing Bathing Assistance: Limited assistance Feeding assistance: Limited assistance Dressing Assistance: Limited assistance     Functional Limitations Info  Sight, Hearing, Speech Sight Info: Impaired (readers) Hearing Info: Adequate Speech Info: Adequate    SPECIAL CARE FACTORS FREQUENCY  PT (By licensed PT), OT (By licensed OT)     PT Frequency:  (5x week) OT Frequency:  (5x week)            Contractures Contractures Info: Not present    Additional Factors Info  Code Status, Allergies, Psychotropic Code Status Info:  (DNR) Allergies Info:  (NKA) Psychotropic Info:  ([place]amenda-SEE MAR)         Current Medications (11/07/2021):  This is the current hospital active medication list Current Facility-Administered Medications  Medication Dose Route Frequency Provider Last Rate Last Admin   0.45 % sodium chloride infusion   Intravenous Continuous Bonnielee Haff, MD 75 mL/hr at 11/07/21 0817 New Bag at 11/07/21 0817   allopurinol (ZYLOPRIM) tablet 200 mg  200 mg Oral q AM Rod Can, MD   200 mg at 11/07/21 0818   amLODipine (NORVASC) tablet 10 mg  10 mg Oral Daily Bonnielee Haff, MD  10 mg at 11/07/21 1020   apixaban (ELIQUIS) tablet 2.5 mg  2.5 mg Oral BID Rod Can, MD   2.5 mg at 11/07/21 1020   atorvastatin (LIPITOR) tablet 20 mg  20 mg Oral Daily Swinteck, Aaron Edelman, MD   20 mg at 11/06/21 2125   calcium carbonate (OS-CAL - dosed in mg of elemental calcium) tablet 1,250 mg  1 tablet Oral Q breakfast Rod Can, MD   1,250 mg at 11/07/21 0258   cholecalciferol (VITAMIN D3) 25 MCG  (1000 UNIT) tablet 1,000 Units  1,000 Units Oral Daily Rod Can, MD   1,000 Units at 11/07/21 1019   docusate sodium (COLACE) capsule 100 mg  100 mg Oral BID Rod Can, MD   100 mg at 11/07/21 1019   feeding supplement (ENSURE ENLIVE / ENSURE PLUS) liquid 237 mL  237 mL Oral BID BM Swinteck, Aaron Edelman, MD   237 mL at 11/07/21 1022   haloperidol lactate (HALDOL) injection 0.5-1 mg  0.5-1 mg Intramuscular Q6H PRN Swinteck, Aaron Edelman, MD       hydrALAZINE (APRESOLINE) injection 5 mg  5 mg Intravenous Q6H PRN Swinteck, Aaron Edelman, MD       levETIRAcetam (KEPPRA) tablet 250 mg  250 mg Oral Daily Swinteck, Aaron Edelman, MD   250 mg at 11/07/21 1020   memantine (NAMENDA) tablet 5 mg  5 mg Oral BID Rod Can, MD   5 mg at 11/07/21 1020   menthol-cetylpyridinium (CEPACOL) lozenge 3 mg  1 lozenge Oral PRN Swinteck, Aaron Edelman, MD       Or   phenol (CHLORASEPTIC) mouth spray 1 spray  1 spray Mouth/Throat PRN Swinteck, Aaron Edelman, MD       metoCLOPramide (REGLAN) tablet 5-10 mg  5-10 mg Oral Q8H PRN Swinteck, Aaron Edelman, MD       Or   metoCLOPramide (REGLAN) injection 5-10 mg  5-10 mg Intravenous Q8H PRN Swinteck, Aaron Edelman, MD       morphine (PF) 2 MG/ML injection 0.5 mg  0.5 mg Intravenous Q2H PRN Swinteck, Aaron Edelman, MD   0.5 mg at 11/05/21 2217   multivitamin with minerals tablet 1 tablet  1 tablet Oral Daily Swinteck, Aaron Edelman, MD   1 tablet at 11/07/21 1020   ondansetron (ZOFRAN) tablet 4 mg  4 mg Oral Q6H PRN Swinteck, Aaron Edelman, MD       Or   ondansetron [facility]) injection 4 mg  4 mg Intravenous Q6H PRN Swinteck, Aaron Edelman, MD       oxyCODONE (Oxy IR/ROXICODONE) immediate release tablet 5 mg  5 mg Oral Q6H PRN Swinteck, Aaron Edelman, MD   5 mg at 11/07/21 1020   polyethylene glycol (MIRALAX / GLYCOLAX) packet 17 g  17 g Oral Daily PRN Swinteck, Aaron Edelman, MD       senna Donavan Burnet) tablet 8.6 mg  1 tablet Oral BID Rod Can, MD   8.6 mg at 11/07/21 1019     Discharge Medications: Please see discharge summary for a list of discharge  medications.  Relevant Imaging Results:  Relevant Lab Results:   Additional Information SS#[id number];COVID vaccine-Moderna x2.  Dessa Phi, RN

## 2021-11-07 NOTE — Evaluation (Signed)
Occupational Therapy Evaluation Patient Details Name: Tracy [name] MRN: [id number] DOB: 1919/10/06 Today's Date: 11/07/2021   History of Present Illness Pt is an 88 year old female who presented to the [facility] ED on 11/03/2021 following an accidental fall when she fell backwards hitting her head while trying to pick up something up off the floor, no LOC, suffered a laceration to the occipital scalp and admitted for right femoral neck fracture s/p right hip hemiarthroplasty on 11/06/21. PMH significant for stage IV CKD with anemia, HTN, recently hospitalized from 09/15/2021 - 10/14/2021 for a left femoral neck fracture, underwent left hip hemiarthroplasty with Dr. Lyla Glassing on 6/12.  She then had a seizure-like activity on 3/55/7322, with an embolic stroke noted on 0/25/4270 and also diagnosed with left femoral vein DVT on 6/15 for which she was taken off DAPT and placed on Eliquis now   Clinical Impression   Patient is a 88 year old female who was admitted for above. Patient was noted to have been at [facility] for [facility] term rehab after recent fall in June with L hip fx.patient needed continued cues to keep BUE on walker in appropriate places when attempting transfers. Patient reported she lives at home alone with parents living next door.  Patient was noted to have decreased functional activity tolerance, decreased endurance, decreased standing balance, decreased safety awareness, and decreased knowledge of AD/AE impacting participation in ADLs. Patient would continue to benefit from skilled OT services at this time while admitted and after d/c to address noted deficits in order to improve overall safety and independence in ADLs.        Recommendations for follow up therapy are one component of a multi-disciplinary discharge planning process, led by the attending physician.  Recommendations may be updated based on patient status, additional functional criteria and insurance authorization.    Follow Up Recommendations  Skilled nursing-short term rehab (<3 hours/day)    Assistance Recommended at Discharge Frequent or constant Supervision/Assistance  Patient can return home with the following A lot of help with bathing/dressing/bathroom;A lot of help with walking and/or transfers;Assistance with cooking/housework;Direct supervision/assist for financial management;Direct supervision/assist for medications management;Help with stairs or ramp for entrance;Assist for transportation    Functional Status Assessment  Patient has had a recent decline in their functional status and demonstrates the ability to make significant improvements in function in a reasonable and predictable amount of time.  Equipment Recommendations  None recommended by OT    Recommendations for Other Services       Precautions / Restrictions Precautions Precautions: Fall Precaution Comments: no hip precautions Restrictions Weight Bearing Restrictions: No Other Position/Activity Restrictions: WBAT      Mobility Bed Mobility Overal bed mobility: Needs Assistance Bed Mobility: Sit to Supine       Sit to supine: Max assist   General bed mobility comments: to bring trunk and BLEs into bed    Transfers                          Balance Overall balance assessment: Needs assistance Sitting-balance support: No upper extremity supported, Feet supported Sitting balance-Leahy Scale: Fair     Standing balance support: Reliant on assistive device for balance, During functional activity, Bilateral upper extremity supported Standing balance-Leahy Scale: Poor                             ADL either performed or assessed with clinical judgement  ADL Overall ADL's : Needs assistance/impaired Eating/Feeding: Set up;Sitting Eating/Feeding Details (indicate cue type and reason): minimal spillage noted Grooming: Set up;Sitting;Wash/dry face   Upper Body Bathing: Minimal  assistance;Sitting   Lower Body Bathing: Sitting/lateral leans;Total assistance;Sit to/from stand   Upper Body Dressing : Minimal assistance;Sitting   Lower Body Dressing: Maximal assistance;Sit to/from stand;Sitting/lateral leans   Toilet Transfer: Maximal assistance;Rolling walker (2 wheels) Toilet Transfer Details (indicate cue type and reason): patient was max A to transfer from recliner in room to bed with patient attempting to reach for bed prior to turning feet. patient needed consistent cues to keep BUE on walker handles with multiple attempts to move them Toileting- Clothing Manipulation and Hygiene: Total assistance;Bed level               Vision Patient Visual Report: No change from baseline       Perception     Praxis      Pertinent Vitals/Pain Pain Assessment Pain Assessment: Faces Faces Pain Scale: Hurts even more Pain Location: hip/thigh with movement, none at rest Pain Descriptors / Indicators: Grimacing, Operative site guarding Pain Intervention(s): Limited activity within patient's tolerance, Monitored during session     Hand Dominance     Extremity/Trunk Assessment Upper Extremity Assessment Upper Extremity Assessment: Overall WFL for tasks assessed   Lower Extremity Assessment Lower Extremity Assessment: Generalized weakness;RLE deficits/detail RLE Deficits / Details: requiring assist due to pain       Communication     Cognition Arousal/Alertness: Awake/alert Behavior During Therapy: WFL for tasks assessed/performed                                   General Comments: patient reported it was 14 and that she lives next door to her parents     General Comments       Exercises     Shoulder Instructions      Home Living Family/patient expects to be discharged to:: Private residence Living Arrangements: Alone Available Help at Discharge: Family;Personal care attendant Type of Home: House Home Access: Stairs to  enter Technical brewer of Steps: 2 Entrance Stairs-Rails: Right;Left Home Layout: One level     Bathroom Shower/Tub: Tub/shower unit             Additional Comments: PLOf taken from chart with patient providing conflicting information      Prior Functioning/Environment                          OT Problem List: Decreased strength;Decreased activity tolerance;Decreased safety awareness;Impaired balance (sitting and/or standing);Pain;Decreased knowledge of precautions;Decreased knowledge of use of DME or AE      OT Treatment/Interventions: Self-care/ADL training;Therapeutic exercise;Neuromuscular education;Energy conservation;DME and/or AE instruction;Therapeutic activities;Balance training;Patient/family education    OT Goals(Current goals can be found in the care plan section) Acute Rehab OT Goals Patient Stated Goal: to go back home OT Goal Formulation: Patient unable to participate in goal setting Time For Goal Achievement: 11/21/21 Potential to Achieve Goals: Fair  OT Frequency: Min 2X/week    Co-evaluation              AM-PAC OT "6 Clicks" Daily Activity     Outcome Measure Help from another person eating meals?: A Little Help from another person taking care of personal grooming?: A Little Help from another person toileting, which includes using toliet, bedpan, or urinal?: Total Help from another person  bathing (including washing, rinsing, drying)?: A Lot Help from another person to put on and taking off regular upper body clothing?: A Little Help from another person to put on and taking off regular lower body clothing?: A Lot 6 Click Score: 14   End of Session Equipment Utilized During Treatment: Gait belt;Rolling walker (2 wheels)  Activity Tolerance: Patient tolerated treatment well Patient left: in bed;with call bell/phone within reach  OT Visit Diagnosis: Unsteadiness on feet (R26.81);Other abnormalities of gait and mobility  (R26.89);Muscle weakness (generalized) (M62.81);History of falling (Z91.81)                Time: 7353-2992 OT Time Calculation (min): 15 min Charges:  OT General Charges $OT Visit: 1 Visit OT Evaluation $OT Eval Moderate Complexity: 1 Mod  Jackelyn Poling OTR/L, MS Acute Rehabilitation Department Office# [phone] Pager# [phone]   Marcellina Millin 11/07/2021, 2:59 PM

## 2021-11-07 NOTE — Progress Notes (Signed)
PROGRESS NOTE   Tracy [name]  JEH:[id number]    DOB: 1920-01-13    DOA: 11/03/2021  PCP: Charlane Ferretti, MD    Brief Narrative:  A pleasant 88 year old female, PMH significant for stage IV CKD with anemia, HTN, recently hospitalized from 09/15/2021 - 10/14/2021 for a left femoral neck fracture, underwent left hip hemiarthroplasty with Dr. Lyla Glassing on 6/12.  She then had a seizure-like activity on 3/78/5885, with an embolic stroke noted on 0/27/7412 and also diagnosed with left femoral vein DVT on 6/15 for which she was taken off DAPT and placed on Eliquis, now presented to the [facility] ED on 11/03/2021 following an accidental fall when she fell backwards hitting her head while trying to pick up something up off the floor, no LOC, suffered a laceration to the occipital scalp.  No LOC.  Admitted for right femoral neck fracture, orthopedics consulted and plan surgery on 8/2 pending Eliquis washout.  Currently on IV heparin bridging.  Hemoglobin down to 7.2, transfusing 1 unit PRBC 8/1.   Assessment & Plan:   Right hip fracture/displaced right femoral neck fracture: Sustained post mechanical fall.  History of left hip hemiarthroplasty for fracture on 09/16/2021.   Surgery was done after Eliquis washout. Underwent right hip hemiarthroplasty on 8/2. X-ray of the left hip did not show any acute findings. PT and OT evaluation today.  Might need to go to skilled nursing facility for rehabilitation.  Leukocytosis Rising WBC noted.  She is afebrile.  No noted to be on steroids.  Could still be reactive.  Will check UA.  History of femoral DVT 09/18/21 (deep vein thrombosis) Eliquis was held for surgery.  Has been resumed this morning.     Essential hypertension Elevated blood pressure readings probably due to pain.  Continue to monitor.  Okay to resume amlodipine.   Acute blood loss anemia complicating anemia due to stage 4 chronic kidney disease (HCC) Baseline hemoglobin recently has  been fluctuating in the 8-9 range.  Presented with hemoglobin of 9.3.  Hemoglobin gradually drifted down, 9.3 > 8.3 > 7.2, suspected due to fracture site bleeding.   No other overt bleeding was noted. Patient transfused 1 unit of PRBC on 8/1.  Hemoglobin responded appropriately and noted to be stable this morning.   Acute on chronic kidney disease, stage 4 (severe) (HCC) Baseline serum creatinine probably in the 1.7-1.9 range.   Increase in creatinine noted today.  Could be due to volume depletion.  Will gently hydrate her and recheck labs tomorrow.  Monitor urine output.  Avoid nephrotoxic agents.     Hyperlipidemia, unspecified Continue atorvastatin   History of repair of left hip joint June 2023 X-ray was done yesterday and noted to be without any acute findings.   History of seizure 09/16/21 Continue Keppra.  Seizure precautions.   History of embolic stroke 8/78/67 Patient was currently on Eliquis so not on DAPT in order to decrease bleeding risk.  Eliquis to be resumed today.   Laceration of occipital scalp Related to fall.  Wound care  Vascular dementia  Delirium precautions.  Mentation is stable.   Accidental fall According to niece, patient was walking without her walker when she tried to pick something up and fell backwards hitting her head.  There was no loss of consciousness.  Patient remained awake and alert and was able to call her niece after the fall  Pleural effusions on CT Associated atelectasis.  Also has 1-2+ leg edema.  Given a dose of  IV Lasix.  Monitor closely.  Follow clinically.  Nutrition Status: Nutrition Problem: Moderate Malnutrition Etiology: chronic illness (CKD4) Signs/Symptoms: mild fat depletion, moderate muscle depletion Interventions: Ensure Enlive (each supplement provides 350kcal and 20 grams of protein), MVI    DVT prophylaxis: Back on Eliquis Code Status: DNR Family Communication: No family at bedside. Disposition: To be  determined   Status is: Inpatient Remains inpatient appropriate because: Right hip fracture status post surgery.  PT and OT eval pending.     Consultants:   Orthopedics/Dr. Lyla Glassing  Procedures:   None  Antimicrobials:   None   Subjective:  Patient pleasantly confused.  Denies any pain this morning.  No chest pain or shortness of breath.  No nausea vomiting.  Repeats her questions.  Objective:   Vitals:   11/06/21 1425 11/06/21 1738 11/06/21 2055 11/06/21 2311  BP: (!) 155/80 (!) 157/94 (!) 148/71 (!) 142/75  Pulse: 97 (!) 102 97 (!) 101  Resp: 18 18 20 18   Temp: (!) 97.5 F (36.4 C) 97.8 F (36.6 C) 98 F (36.7 C) 98 F (36.7 C)  TempSrc: Axillary Oral    SpO2: 97% 100% 99% 100%  Weight:      Height:        General appearance: Awake alert.  In no distress.  Distracted Resp: Clear to auscultation bilaterally.  Normal effort Cardio: S1-S2 is normal regular.  No S3-S4.  No rubs murmurs or bruit GI: Abdomen is soft.  Nontender nondistended.  Bowel sounds are present normal.  No masses organomegaly Extremities: Very limited range of motion of both the lower extremities.  Physical deconditioning is noted. Neurologic:  No focal neurological deficits.     Data Reviewed:     CBC: Recent Labs  Lab 11/03/21 1608 11/04/21 0612 11/05/21 2028 11/06/21 0417 11/07/21 0436  WBC 10.2   < > 14.8* 15.1* 23.3*  NEUTROABS 6.7  --   --   --   --   HGB 9.3*   < > 9.7* 9.0* 9.1*  HCT 29.5*   < > 29.4* 27.9* 27.3*  MCV 87.5   < > 84.0 86.1 85.3  PLT 436*   < > 349 310 281   < > = values in this interval not displayed.     Basic Metabolic Panel: Recent Labs  Lab 11/03/21 1608 11/04/21 0612 11/05/21 0404 11/06/21 0417 11/07/21 0436  NA 140 141 140 139 137  K 4.2 4.7 4.2 4.3 4.8  CL 112* 112* 112* 110 107  CO2 21* 22 18* 23 22  GLUCOSE 115* 102* 113* 114* 134*  BUN 25* 25* 24* 28* 33*  CREATININE 1.98* 1.74* 1.61* 1.87* 2.17*  CALCIUM 9.0 8.7* 7.9* 7.9* 8.0*      Liver Function Tests: Recent Labs  Lab 11/03/21 1608  AST 21  ALT 17  ALKPHOS 111  BILITOT 0.6  PROT 7.3  ALBUMIN 3.3*      Microbiology Studies:   Recent Results (from the past 240 hour(s))  Surgical pcr screen     Status: None   Collection Time: 11/05/21  6:31 PM   Specimen: Nasal Mucosa; Nasal Swab  Result Value Ref Range Status   MRSA, PCR NEGATIVE NEGATIVE Final   Staphylococcus aureus NEGATIVE NEGATIVE Final    Comment: (NOTE) The Xpert SA Assay (FDA approved for NASAL specimens in patients 48 years of age and older), is one component of a comprehensive surveillance program. It is not intended to diagnose infection nor to guide or monitor treatment.  Performed at [facility], [place] [address], [place], [place] [address]     Radiology Studies:  Pelvis Portable  Result Date: 11/06/2021 CLINICAL DATA:  Postop right hip. EXAM: PORTABLE PELVIS 1-2 VIEWS COMPARISON:  09/16/2021. FINDINGS: Interval right total hip arthroplasty. Joint and subcutaneous air present. Overlying surgical skin staples. Previous left hip arthroplasty on 09/16/2021. IMPRESSION: Interval right hip arthroplasty with expected postoperative findings. Electronically Signed   By: Lorin Picket M.D.   On: 11/06/2021 13:43   DG HIP PORT UNILAT WITH PELVIS 1V RIGHT  Result Date: 11/06/2021 CLINICAL DATA:  Anterior right hip arthroplasty. Intraoperative fluoroscopy. EXAM: DG HIP (WITH OR WITHOUT PELVIS) 1V PORT RIGHT COMPARISON:  CT right hip 11/03/2021 FINDINGS: Images were performed intraoperatively without the presence of a radiologist. Interval total right hip arthroplasty. Redemonstration of total left hip arthroplasty. No evidence of hardware complication. Total fluoroscopy images: 2 Total fluoroscopy time: 8 seconds Total dose: Radiation Exposure Index (as provided by the fluoroscopic device): 0.6705 mGy air Kerma Please see intraoperative findings for further detail. IMPRESSION:  Intraoperative fluoroscopy for total right hip arthroplasty. Electronically Signed   By: Yvonne Kendall M.D.   On: 11/06/2021 12:44   DG C-Arm 1-60 Min-No Report  Result Date: 11/06/2021 Fluoroscopy was utilized by the requesting physician.  No radiographic interpretation.   DG HIP UNILAT WITH PELVIS 1V LEFT  Result Date: 11/06/2021 CLINICAL DATA:  LEFT hip pain EXAM: DG HIP (WITH OR WITHOUT PELVIS) 1V*L* COMPARISON:  None Available. FINDINGS: LEFT hip total arthroplasty.  No dislocation or fracture. IMPRESSION: No acute findings of.  Single view LEFT hip Electronically Signed   By: Suzy Bouchard M.D.   On: 11/06/2021 11:26    Scheduled Meds:    allopurinol  200 mg Oral q AM   apixaban  2.5 mg Oral BID   atorvastatin  20 mg Oral Daily   calcium carbonate  1 tablet Oral Q breakfast   vitamin D3  1,000 Units Oral Daily   docusate sodium  100 mg Oral BID   feeding supplement  237 mL Oral BID BM   levETIRAcetam  250 mg Oral Daily   memantine  5 mg Oral BID   multivitamin with minerals  1 tablet Oral Daily   senna  1 tablet Oral BID    Continuous Infusions:    sodium chloride 75 mL/hr at 11/07/21 0817     LOS: 4 days     Bonnielee Haff, MD    11/07/2021, 9:32 AM

## 2021-11-07 NOTE — Anesthesia Postprocedure Evaluation (Signed)
Anesthesia Post Note  Patient: Tracy [name]  Procedure(s) Performed: ANTERIOR APPROACH HEMI HIP ARTHROPLASTY (Right)     Patient location during evaluation: PACU Anesthesia Type: General Level of consciousness: awake and alert Pain management: pain level controlled Vital Signs Assessment: post-procedure vital signs reviewed and stable Respiratory status: spontaneous breathing, nonlabored ventilation, respiratory function stable and patient connected to nasal cannula oxygen Cardiovascular status: blood pressure returned to baseline and stable Postop Assessment: no apparent nausea or vomiting Anesthetic complications: no   No notable events documented.  Last Vitals:  Vitals:   11/06/21 2055 11/06/21 2311  BP: (!) 148/71 (!) 142/75  Pulse: 97 (!) 101  Resp: 20 18  Temp: 36.7 C 36.7 C  SpO2: 99% 100%    Last Pain:  Vitals:   11/06/21 2032  TempSrc:   PainSc: 0-No pain                 Basem Yannuzzi S

## 2021-11-07 NOTE — Progress Notes (Signed)
    Subjective:  Patient reports pain as mild.  Denies N/V/CP/SOB. No c/o.  Objective:   VITALS:   Vitals:   11/06/21 1425 11/06/21 1738 11/06/21 2055 11/06/21 2311  BP: (!) 155/80 (!) 157/94 (!) 148/71 (!) 142/75  Pulse: 97 (!) 102 97 (!) 101  Resp: 18 18 20 18   Temp: (!) 97.5 F (36.4 C) 97.8 F (36.6 C) 98 F (36.7 C) 98 F (36.7 C)  TempSrc: Axillary Oral    SpO2: 97% 100% 99% 100%  Weight:      Height:        NAD ABD soft Sensation intact distally Intact pulses distally Dorsiflexion/Plantar flexion intact Incision: dressing C/D/I Compartment soft   Lab Results  Component Value Date   WBC 23.3 (H) 11/07/2021   HGB 9.1 (L) 11/07/2021   HCT 27.3 (L) 11/07/2021   MCV 85.3 11/07/2021   PLT 281 11/07/2021   BMET    Component Value Date/Time   NA 137 11/07/2021 0436   K 4.8 11/07/2021 0436   CL 107 11/07/2021 0436   CO2 22 11/07/2021 0436   GLUCOSE 134 (H) 11/07/2021 0436   BUN 33 (H) 11/07/2021 0436   CREATININE 2.17 (H) 11/07/2021 0436   CALCIUM 8.0 (L) 11/07/2021 0436   GFRNONAA 20 (L) 11/07/2021 0436     Assessment/Plan: 1 Day Post-Op   Principal Problem:   Closed right hip fracture, initial encounter (HCC) Active Problems:   Essential hypertension   Anemia due to stage 4 chronic kidney disease (HCC)   Hyperlipidemia, unspecified   Accidental fall   Vascular dementia (HCC)   Chronic kidney disease, stage 4 (severe) (HCC)   Laceration of occipital scalp   History of embolic stroke 08/10/67   History of femoral DVT 09/18/21 (deep vein thrombosis)   History of seizure 09/16/21   History of repair of left hip joint June 2023   Closed right hip fracture (HCC)   Malnutrition of moderate degree   WBAT with walker DVT ppx:  apixaban , SCDs, TEDS PO pain control PT/OT Dispo: D/C planning, f/u 2 weeks after dscharge - call [phone] to schedule   Hilton Cork Keairra Bardon 11/07/2021, 9:11 AM   Rod Can, MD [phone] [facility]  Orthopaedics is now [facility]  [place] [address], [place], [place] [address] Phone: [phone] [email or web address] Facebook  [employer]

## 2021-11-08 ENCOUNTER — Encounter (HOSPITAL_COMMUNITY): Payer: Self-pay | Admitting: Orthopedic Surgery

## 2021-11-08 DIAGNOSIS — S72001A Fracture of unspecified part of neck of right femur, initial encounter for closed fracture: Secondary | ICD-10-CM | POA: Diagnosis not present

## 2021-11-08 DIAGNOSIS — Z86718 Personal history of other venous thrombosis and embolism: Secondary | ICD-10-CM | POA: Diagnosis not present

## 2021-11-08 DIAGNOSIS — N184 Chronic kidney disease, stage 4 (severe): Secondary | ICD-10-CM | POA: Diagnosis not present

## 2021-11-08 DIAGNOSIS — I10 Essential (primary) hypertension: Secondary | ICD-10-CM | POA: Diagnosis not present

## 2021-11-08 LAB — BASIC METABOLIC PANEL
Anion gap: 7 (ref 5–15)
BUN: 37 mg/dL — ABNORMAL HIGH (ref 8–23)
CO2: 22 mmol/L (ref 22–32)
Calcium: 8.1 mg/dL — ABNORMAL LOW (ref 8.9–10.3)
Chloride: 108 mmol/L (ref 98–111)
Creatinine, Ser: 2.02 mg/dL — ABNORMAL HIGH (ref 0.44–1.00)
GFR, Estimated: 22 mL/min — ABNORMAL LOW (ref >60)
Glucose, Bld: 96 mg/dL (ref 70–99)
Potassium: 4.8 mmol/L (ref 3.5–5.1)
Sodium: 137 mmol/L (ref 135–145)

## 2021-11-08 LAB — CBC
HCT: 25.3 % — ABNORMAL LOW (ref 36.0–46.0)
Hemoglobin: 8 g/dL — ABNORMAL LOW (ref 12.0–15.0)
MCH: 27.4 pg (ref 26.0–34.0)
MCHC: 31.6 g/dL (ref 30.0–36.0)
MCV: 86.6 fL (ref 80.0–100.0)
Platelets: 280 10*3/uL (ref 150–400)
RBC: 2.92 MIL/uL — ABNORMAL LOW (ref 3.87–5.11)
RDW: 14.4 % (ref 11.5–15.5)
WBC: 16.7 10*3/uL — ABNORMAL HIGH (ref 4.0–10.5)
nRBC: 0 % (ref 0.0–0.2)

## 2021-11-08 MED ORDER — POLYETHYLENE GLYCOL 3350 17 G PO PACK
17.0000 g | PACK | Freq: Two times a day (BID) | ORAL | Status: DC
  Administered 2021-11-08 – 2021-11-09 (×4): 17 g via ORAL
  Filled 2021-11-08 (×4): qty 1

## 2021-11-08 MED ORDER — SODIUM CHLORIDE 0.45 % IV SOLN: INTRAVENOUS | Status: DC

## 2021-11-08 MED ORDER — SENNA 8.6 MG PO TABS
2.0000 | ORAL_TABLET | Freq: Two times a day (BID) | ORAL | Status: DC
  Administered 2021-11-08 – 2021-11-09 (×4): 17.2 mg via ORAL
  Filled 2021-11-08 (×4): qty 2

## 2021-11-08 NOTE — TOC Progression Note (Signed)
Transition of Care [facility]) - Progression Note    Patient Details  Name: Tracy [name] MRN: [id number] Date of Birth: 1919-04-18  [facility] CM/SW [place], RN Phone Number: 11/08/2021, 3:38 PM  Clinical Narrative:   When ready for d/c fax d/c summary, AVS, and MAR to [phone]. Report can be called to  [phone] request [facility] room 107B.  TOC will continue to follow.    Expected Discharge Plan: Skilled Nursing Facility Barriers to Discharge: Insurance Authorization  Expected Discharge Plan and Services Expected Discharge Plan: [place]   Discharge Planning Services: CM Consult   Living arrangements for the past 2 months: Single Family Home                                       Social Determinants of Health (SDOH) Interventions    Readmission Risk Interventions     No data to display

## 2021-11-08 NOTE — TOC Progression Note (Signed)
Transition of Care [facility]) - Progression Note    Patient Details  Name: Tracy [name] MRN: [id number] Date of Birth: 1919/11/13  Transition of Care [facility]) CM/SW Contact  Baelyn Doring, Juliann Pulse, RN Phone Number: 11/08/2021, 1:22 PM  Clinical Narrative:[name] choose [facility]-initiated Candace Cruise [id number] Josem Kaufmann.       Expected Discharge Plan: Skilled Nursing Facility Barriers to Discharge: Insurance Authorization  Expected Discharge Plan and Services Expected Discharge Plan: [place]   Discharge Planning Services: CM Consult   Living arrangements for the past 2 months: Single Family Home                                       Social Determinants of Health (SDOH) Interventions    Readmission Risk Interventions     No data to display

## 2021-11-08 NOTE — TOC Progression Note (Signed)
Transition of Care [facility]) - Progression Note    Patient Details  Name: Tracy [name] MRN: [id number] Date of Birth: 11/11/1919  Transition of Care [facility]) CM/SW [place], RN Phone Number: 11/08/2021, 10:11 AM  Clinical Narrative:   RNCM left a voicemail with patient's niece [name] to share bed offer awaiting a call back.    Expected Discharge Plan: [place] Barriers to Discharge: Continued Medical Work up  Expected Discharge Plan and Services Expected Discharge Plan: [place]   Discharge Planning Services: CM Consult   Living arrangements for the past 2 months: Single Family Home                                       Social Determinants of Health (SDOH) Interventions    Readmission Risk Interventions     No data to display

## 2021-11-08 NOTE — Progress Notes (Signed)
PROGRESS NOTE   Tracy [name]  AXE:[id number]    DOB: [birth year]    DOA: 11/03/2021  PCP: Charlane Ferretti, MD    Brief Narrative:  A pleasant 87 year old female, PMH significant for stage IV CKD with anemia, HTN, recently hospitalized from 09/15/2021 - 10/14/2021 for a left femoral neck fracture, underwent left hip hemiarthroplasty with Dr. Lyla Glassing on 6/12.  She then had a seizure-like activity on 04/16/3157, with an embolic stroke noted on 4/58/5929 and also diagnosed with left femoral vein DVT on 6/15 for which she was taken off DAPT and placed on Eliquis, now presented to the [facility] ED on 11/03/2021 following an accidental fall when she fell backwards hitting her head while trying to pick up something up off the floor, no LOC, suffered a laceration to the occipital scalp.  No LOC.  Admitted for right femoral neck fracture, orthopedics consulted and plan surgery on 8/2 pending Eliquis washout.  Currently on IV heparin bridging.  Hemoglobin down to 7.2, transfusing 1 unit PRBC 8/1.   Assessment & Plan:   Right hip fracture/displaced right femoral neck fracture: Sustained post mechanical fall.  History of left hip hemiarthroplasty for fracture on 09/16/2021.   Surgery was done after Eliquis washout. Underwent right hip hemiarthroplasty on 8/2. X-ray of the left hip did not show any acute findings. Underwent PT and OT evaluation.  Skilled nursing facility for short-term rehab was recommended.   Needs outpatient follow-up with orthopedics in 2 weeks.  Leukocytosis Likely reactive and possibly secondary to volume depletion.   UA was unremarkable.  She is afebrile.  Does not have any other symptoms.  No diarrhea.  She is actually constipated. She was given IV fluids yesterday with improvement in WBC noted today.    Constipation Multifactorial.  Aggressive bowel regimen will be ordered.  History of femoral DVT 09/18/21 (deep vein thrombosis) Eliquis was held for surgery.  Resumed  yesterday.   Essential hypertension Elevated blood pressure readings probably due to pain.  Continue to monitor.  Amlodipine was resumed.  Blood pressure is better controlled now.   Acute blood loss anemia complicating anemia due to stage 4 chronic kidney disease (HCC) Baseline hemoglobin recently has been fluctuating in the 8-9 range.  Presented with hemoglobin of 9.3.  Hemoglobin gradually drifted down, 9.3 > 8.3 > 7.2, suspected due to fracture site bleeding.   No other overt bleeding was noted. Patient transfused 1 unit of PRBC on 8/1.  Hemoglobin responded appropriately. Hemoglobin noted to be 8.0 today.  Will recheck tomorrow.   Acute on chronic kidney disease, stage 4 (severe) (HCC) Baseline serum creatinine probably in the 1.7-1.9 range.   Rising creatinine noted yesterday to 2.17.  Was hydrated since she has had poor oral intake.  Improved to 2.0 today.  Continue IV fluids for another 24 hours.  Recheck labs tomorrow.  Monitor urine output.  Avoid nephrotoxic agents.     Hyperlipidemia, unspecified Continue atorvastatin   History of repair of left hip joint June 2023 X-ray was done and noted to be without any acute findings.   History of seizure 09/16/21 Continue Keppra.  Seizure precautions.   History of embolic stroke 2/44/62 Patient was currently on Eliquis so not on DAPT in order to decrease bleeding risk.  Eliquis has been resumed.   Laceration of occipital scalp Related to fall.  Wound care  Vascular dementia  Delirium precautions.  Mentation is stable.  Resume Namenda.   Accidental fall According to niece, patient was  walking without her walker when she tried to pick something up and fell backwards hitting her head.  There was no loss of consciousness.  Patient remained awake and alert and was able to call her niece after the fall  Pleural effusions on CT Associated atelectasis.  Also has 1-2+ leg edema.  Given a dose of IV Lasix.  Monitor closely.  Follow  clinically.  Nutrition Status: Nutrition Problem: Moderate Malnutrition Etiology: chronic illness (CKD4) Signs/Symptoms: mild fat depletion, moderate muscle depletion Interventions: Ensure Enlive (each supplement provides 350kcal and 20 grams of protein), MVI    DVT prophylaxis:  Eliquis Code Status: DNR Family Communication: No family at bedside.  Discussed with niece yesterday. Disposition: Skilled nursing facility for short-term rehab.   Status is: Inpatient Remains inpatient appropriate because: Right hip fracture status post surgery.  PT and OT eval pending.     Consultants:   Orthopedics/Dr. Lyla Glassing  Procedures:   None  Antimicrobials:   None   Subjective:  Pleasantly confused.  Complains of pain in the right hip area.  No chest pain shortness of breath.  Has not had a bowel movement yet.  Objective:   Vitals:   11/07/21 1005 11/07/21 2024 11/08/21 0359 11/08/21 0928  BP: (!) 143/67 (!) 160/75 (!) 129/56 136/68  Pulse: (!) 106 (!) 107 96   Resp: 16 16 18    Temp: 98.4 F (36.9 C) 98.9 F (37.2 C) 97.6 F (36.4 C)   TempSrc: Oral Oral Oral   SpO2: 100% 95%    Weight:      Height:        General appearance: Awake alert.  In no distress.  Distracted Resp: Clear to auscultation bilaterally.  Normal effort Cardio: S1-S2 is normal regular.  No S3-S4.  No rubs murmurs or bruit GI: Abdomen is soft.  Nontender nondistended.  Bowel sounds are present normal.  No masses organomegaly Extremities: No edema.   Neurologic: No focal neurological deficits.     Data Reviewed:     CBC: Recent Labs  Lab 11/03/21 1608 11/04/21 0612 11/06/21 0417 11/07/21 0436 11/08/21 0406  WBC 10.2   < > 15.1* 23.3* 16.7*  NEUTROABS 6.7  --   --   --   --   HGB 9.3*   < > 9.0* 9.1* 8.0*  HCT 29.5*   < > 27.9* 27.3* 25.3*  MCV 87.5   < > 86.1 85.3 86.6  PLT 436*   < > 310 281 280   < > = values in this interval not displayed.     Basic Metabolic Panel: Recent Labs   Lab 11/04/21 0612 11/05/21 0404 11/06/21 0417 11/07/21 0436 11/08/21 0406  NA 141 140 139 137 137  K 4.7 4.2 4.3 4.8 4.8  CL 112* 112* 110 107 108  CO2 22 18* 23 22 22   GLUCOSE 102* 113* 114* 134* 96  BUN 25* 24* 28* 33* 37*  CREATININE 1.74* 1.61* 1.87* 2.17* 2.02*  CALCIUM 8.7* 7.9* 7.9* 8.0* 8.1*     Liver Function Tests: Recent Labs  Lab 11/03/21 1608  AST 21  ALT 17  ALKPHOS 111  BILITOT 0.6  PROT 7.3  ALBUMIN 3.3*      Microbiology Studies:   Recent Results (from the past 240 hour(s))  Surgical pcr screen     Status: None   Collection Time: 11/05/21  6:31 PM   Specimen: Nasal Mucosa; Nasal Swab  Result Value Ref Range Status   MRSA, PCR NEGATIVE NEGATIVE Final  Staphylococcus aureus NEGATIVE NEGATIVE Final    Comment: (NOTE) The Xpert SA Assay (FDA approved for NASAL specimens in patients 15 years of age and older), is one component of a comprehensive surveillance program. It is not intended to diagnose infection nor to guide or monitor treatment. Performed at [facility], [place] [address] [place], [place] [address]     Radiology Studies:  Pelvis Portable  Result Date: 11/06/2021 CLINICAL DATA:  Postop right hip. EXAM: PORTABLE PELVIS 1-2 VIEWS COMPARISON:  09/16/2021. FINDINGS: Interval right total hip arthroplasty. Joint and subcutaneous air present. Overlying surgical skin staples. Previous left hip arthroplasty on 09/16/2021. IMPRESSION: Interval right hip arthroplasty with expected postoperative findings. Electronically Signed   By: Lorin Picket M.D.   On: 11/06/2021 13:43   DG HIP PORT UNILAT WITH PELVIS 1V RIGHT  Result Date: 11/06/2021 CLINICAL DATA:  Anterior right hip arthroplasty. Intraoperative fluoroscopy. EXAM: DG HIP (WITH OR WITHOUT PELVIS) 1V PORT RIGHT COMPARISON:  CT right hip 11/03/2021 FINDINGS: Images were performed intraoperatively without the presence of a radiologist. Interval total right hip arthroplasty.  Redemonstration of total left hip arthroplasty. No evidence of hardware complication. Total fluoroscopy images: 2 Total fluoroscopy time: 8 seconds Total dose: Radiation Exposure Index (as provided by the fluoroscopic device): 0.6705 mGy air Kerma Please see intraoperative findings for further detail. IMPRESSION: Intraoperative fluoroscopy for total right hip arthroplasty. Electronically Signed   By: Yvonne Kendall M.D.   On: 11/06/2021 12:44   DG C-Arm 1-60 Min-No Report  Result Date: 11/06/2021 Fluoroscopy was utilized by the requesting physician.  No radiographic interpretation.   DG HIP UNILAT WITH PELVIS 1V LEFT  Result Date: 11/06/2021 CLINICAL DATA:  LEFT hip pain EXAM: DG HIP (WITH OR WITHOUT PELVIS) 1V*L* COMPARISON:  None Available. FINDINGS: LEFT hip total arthroplasty.  No dislocation or fracture. IMPRESSION: No acute findings of.  Single view LEFT hip Electronically Signed   By: Suzy Bouchard M.D.   On: 11/06/2021 11:26    Scheduled Meds:    allopurinol  200 mg Oral q AM   amLODipine  10 mg Oral Daily   apixaban  2.5 mg Oral BID   atorvastatin  20 mg Oral Daily   calcium carbonate  1 tablet Oral Q breakfast   vitamin D3  1,000 Units Oral Daily   docusate sodium  100 mg Oral BID   feeding supplement  237 mL Oral BID BM   levETIRAcetam  250 mg Oral Daily   memantine  5 mg Oral BID   multivitamin with minerals  1 tablet Oral Daily   polyethylene glycol  17 g Oral BID   senna  2 tablet Oral BID    Continuous Infusions:    sodium chloride 50 mL/hr at 11/08/21 0742     LOS: 5 days     Bonnielee Haff, MD    11/08/2021, 9:33 AM

## 2021-11-08 NOTE — Care Management Important Message (Signed)
Important Message  Patient Details  Name: Tracy [name] MRN: [id number] Date of Birth: 1919-07-16   Medicare Important Message Given:  Yes     Memory Argue 11/08/2021, 12:54 PM

## 2021-11-08 NOTE — TOC Progression Note (Addendum)
Transition of Care [facility]) - Progression Note    Patient Details  Name: Tracy [name] MRN: [id number] Date of Birth: [birth year]  Transition of Care [facility]) CM/SW [place], RN Phone Number: 11/08/2021, 2:46 PM  Clinical Narrative:  Patient's niece [name] selected [place]. Received [employer] plan ID# [id number]. MD notified, anticipated/potential d/c 11/09/21. Kristal with [employer] notified, and aware of anticipated discharge 11/09/21.   TOC will continue to follow.  Expected Discharge Plan: Skilled Nursing Facility Barriers to Discharge: Insurance Authorization  Expected Discharge Plan and Services Expected Discharge Plan: [place]   Discharge Planning Services: CM Consult   Living arrangements for the past 2 months: Single Family Home                                       Social Determinants of Health (SDOH) Interventions    Readmission Risk Interventions     No data to display

## 2021-11-08 NOTE — Progress Notes (Signed)
    Subjective:  Patient reports pain as mild.  Denies N/V/CP/SOB. No c/o.  Objective:   VITALS:   Vitals:   11/06/21 2311 11/07/21 1005 11/07/21 2024 11/08/21 0359  BP: (!) 142/75 (!) 143/67 (!) 160/75 (!) 129/56  Pulse: (!) 101 (!) 106 (!) 107 96  Resp: 18 16 16 18   Temp: 98 F (36.7 C) 98.4 F (36.9 C) 98.9 F (37.2 C) 97.6 F (36.4 C)  TempSrc:  Oral Oral Oral  SpO2: 100% 100% 95%   Weight:      Height:        NAD ABD soft Sensation intact distally Intact pulses distally Dorsiflexion/Plantar flexion intact Incision: dressing C/D/I Compartment soft   Lab Results  Component Value Date   WBC 16.7 (H) 11/08/2021   HGB 8.0 (L) 11/08/2021   HCT 25.3 (L) 11/08/2021   MCV 86.6 11/08/2021   PLT 280 11/08/2021   BMET    Component Value Date/Time   NA 137 11/08/2021 0406   K 4.8 11/08/2021 0406   CL 108 11/08/2021 0406   CO2 22 11/08/2021 0406   GLUCOSE 96 11/08/2021 0406   BUN 37 (H) 11/08/2021 0406   CREATININE 2.02 (H) 11/08/2021 0406   CALCIUM 8.1 (L) 11/08/2021 0406   GFRNONAA 22 (L) 11/08/2021 0406     Assessment/Plan: 2 Days Post-Op   Principal Problem:   Closed right hip fracture, initial encounter ([place]) Active Problems:   Essential hypertension   Anemia due to stage 4 chronic kidney disease (HCC)   Hyperlipidemia, unspecified   Accidental fall   Vascular dementia (HCC)   Chronic kidney disease, stage 4 (severe) (HCC)   Laceration of occipital scalp   History of embolic stroke 6/38/75   History of femoral DVT 09/18/21 (deep vein thrombosis)   History of seizure 09/16/21   History of repair of left hip joint June 2023   Closed right hip fracture (HCC)   Malnutrition of moderate degree   WBAT with walker DVT ppx:  apixaban , SCDs, TEDS PO pain control PT/OT Dispo: D/C planning, f/u 2 weeks after dscharge - call [phone] to schedule   Tracy [name] 11/08/2021, 7:00 AM   Rod Can, MD [phone] [facility]  Orthopaedics is now [facility]  [place] [address], [address], [place], [place] [address] Phone: [phone] [email or web address] Facebook  [employer]

## 2021-11-08 NOTE — TOC Progression Note (Signed)
Transition of Care [facility]) - Progression Note    Patient Details  Name: Tracy [name] MRN: [id number] Date of Birth: 1919/08/01  Transition of Care [facility]) CM/SW [place], RN Phone Number: 11/08/2021, 9:45 AM  Clinical Narrative:   Will provide bed offers, await choice, prior auth.  1. 1.2 mi [facility] [address] [place], [place] [address] [phone] Overall rating Much below average 2. 1.5 mi [place] at the [place], [place] [address] [phone] Overall rating Above average 3. [place], [place] [address] [phone] Overall rating Above average 4. 2.3 mi [place], [place] [address] [phone] Overall rating Much below average 5. 2.7 [place] [address] [place], [place] [address] [phone] Overall rating Much below average 6. 3.1 mi [place] [facility] and [place], [place] [address] [phone] Overall rating Above average 7. 3.1 mi [facility] and [place], [place] [address] [phone] Overall rating Much below average 8. 3.7 mi [place] [address] [place], [place] [address] [phone] Overall rating Much below average 9. 3.8 mi [facility] [place], [place] [address] [phone] Overall rating Much below average 10. 5.4 [place], [place] [address] [phone] Overall rating Average 11. 5.7 mi [facility] [place] at [place], [place] [address] [phone] Overall rating Much above average 12. 7.2 mi [place], [place] [address] [phone] Overall rating Above average 13. 7.3 mi [facility] [place], [place] [address] [phone] Overall rating Above average 14. [address] [place], [place] [address] [phone] Overall rating Much above average 15. 10.6 mi The [place] [address] [place], [place] [address] [phone] Overall rating Above average 16. 10.9 mi [facility] [address] [place], [place] [address] [phone] Overall rating Much above average 17. 12.5 mi [place] at [facility] [address] [place], [place] [address] [phone] Overall rating Much above average 18. 14.1 mi [facility] [address] [place], [place] [address] [phone] Overall rating Much below average 19. 14.5 [facility] [address] [place], [place] [address] [phone] Overall rating Much below average 20. 15 mi [facility] [place], [place] [address] [phone] Overall rating Above average 21. 15.6 mi The [place] [address] [place], [place] [address] [phone] Overall rating Below average 22. 16.1 mi [place] at [place] [address], [place] [address] [phone] Overall rating Much above average 23. 16.2 mi [facility] [place], [place] [address] [phone] Overall rating Much above average 24. 16.2 mi [place], [place] [address] [phone] Overall rating Average 25. 16.3 [place] [address] [place],  [address] [phone] Overall rating Much below  average 26. 16.9 mi [place] [address] [place],  [address] [phone] Overall rating Average 27. 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 19.7 mi [facility] and [place], [place] [address] [phone] Overall rating Much below average 29. 20.4 [place] [facility] [address] [place], [place] [address] [phone] Overall rating Much below average 30. 20.6 [place], [place] [address] [phone] Overall rating Much above average 31. 20.9 mi [facility] [address] [place], [place] [address] [phone] Overall rating Below average 32. 20.9 mi [place], [place] [address] [phone] Overall rating Much below average 33. 21.4 mi [facility] [address] [place], [place] [address] [phone] Overall rating Much below average 34. 21.7 mi [facility] [address] [place], [place] [address] [phone] Overall rating Above average 35. 21.8 [place], [place] [address] [phone] Overall rating Not available18 36. 23.2 [address] [place], [place] [address] [phone] Overall rating Below average 37. 23.3 mi [facility] [place], [place] [address] [phone] Overall rating Much below average 38. 23.6 [address] [place], [place] [address] [phone] Overall rating Much above average 39. 24.3 [facility] [address] [place],  [address] [phone] Overall rating Much below average 40. 24.7 mi [place] [facility] [place],  [address] [phone] Overall rating Average To explore and download nursing home data,visit the data catalog on CMS.gov  Expected Discharge Plan: [place] Barriers to Discharge: Continued Medical Work up  Expected Discharge Plan and Services Expected Discharge Plan: [place]   Discharge Planning Services: CM Consult   Living arrangements for the past 2 months: Single Family Home                                       Social Determinants of Health (SDOH) Interventions    Readmission Risk Interventions     No data to display

## 2021-11-09 ENCOUNTER — Inpatient Hospital Stay (HOSPITAL_COMMUNITY): Payer: Medicare PPO

## 2021-11-09 DIAGNOSIS — N184 Chronic kidney disease, stage 4 (severe): Secondary | ICD-10-CM | POA: Diagnosis not present

## 2021-11-09 DIAGNOSIS — I10 Essential (primary) hypertension: Secondary | ICD-10-CM | POA: Diagnosis not present

## 2021-11-09 DIAGNOSIS — S72001A Fracture of unspecified part of neck of right femur, initial encounter for closed fracture: Secondary | ICD-10-CM | POA: Diagnosis not present

## 2021-11-09 DIAGNOSIS — Z86718 Personal history of other venous thrombosis and embolism: Secondary | ICD-10-CM | POA: Diagnosis not present

## 2021-11-09 LAB — CBC
HCT: 22.4 % — ABNORMAL LOW (ref 36.0–46.0)
Hemoglobin: 7.2 g/dL — ABNORMAL LOW (ref 12.0–15.0)
MCH: 27.8 pg (ref 26.0–34.0)
MCHC: 32.1 g/dL (ref 30.0–36.0)
MCV: 86.5 fL (ref 80.0–100.0)
Platelets: 282 10*3/uL (ref 150–400)
RBC: 2.59 MIL/uL — ABNORMAL LOW (ref 3.87–5.11)
RDW: 14.4 % (ref 11.5–15.5)
WBC: 14 10*3/uL — ABNORMAL HIGH (ref 4.0–10.5)
nRBC: 0 % (ref 0.0–0.2)

## 2021-11-09 LAB — BASIC METABOLIC PANEL
Anion gap: 8 (ref 5–15)
BUN: 40 mg/dL — ABNORMAL HIGH (ref 8–23)
CO2: 21 mmol/L — ABNORMAL LOW (ref 22–32)
Calcium: 7.8 mg/dL — ABNORMAL LOW (ref 8.9–10.3)
Chloride: 104 mmol/L (ref 98–111)
Creatinine, Ser: 1.84 mg/dL — ABNORMAL HIGH (ref 0.44–1.00)
GFR, Estimated: 24 mL/min — ABNORMAL LOW (ref >60)
Glucose, Bld: 98 mg/dL (ref 70–99)
Potassium: 5 mmol/L (ref 3.5–5.1)
Sodium: 133 mmol/L — ABNORMAL LOW (ref 135–145)

## 2021-11-09 LAB — HEMOGLOBIN AND HEMATOCRIT, BLOOD
HCT: 36.2 % (ref 36.0–46.0)
Hemoglobin: 12.2 g/dL (ref 12.0–15.0)

## 2021-11-09 LAB — PREPARE RBC (CROSSMATCH)

## 2021-11-09 MED ORDER — FUROSEMIDE 10 MG/ML IJ SOLN
20.0000 mg | Freq: Once | INTRAMUSCULAR | Status: AC
Start: 2021-11-09 — End: 2021-11-09
  Administered 2021-11-09: 20 mg via INTRAVENOUS
  Filled 2021-11-09: qty 2

## 2021-11-09 MED ORDER — FLEET ENEMA 7-19 GM/118ML RE ENEM
1.0000 | ENEMA | Freq: Once | RECTAL | Status: AC
  Administered 2021-11-09: 1 via RECTAL
  Filled 2021-11-09: qty 1

## 2021-11-09 MED ORDER — SODIUM CHLORIDE 0.9% IV SOLUTION: Freq: Once | INTRAVENOUS | Status: AC

## 2021-11-09 NOTE — Plan of Care (Signed)
?  Problem: Clinical Measurements: ?Goal: Ability to maintain clinical measurements within normal limits will improve ?Outcome: Not Progressing ?  ?

## 2021-11-09 NOTE — Progress Notes (Signed)
PROGRESS NOTE   Tracy [name]  XBW:[id number]    DOB: 10-26-1919    DOA: 11/03/2021  PCP: Charlane Ferretti, MD    Brief Narrative:  A pleasant 88 year old female, PMH significant for stage IV CKD with anemia, HTN, recently hospitalized from 09/15/2021 - 10/14/2021 for a left femoral neck fracture, underwent left hip hemiarthroplasty with Dr. Lyla Glassing on 6/12.  She then had a seizure-like activity on 1/63/8453, with an embolic stroke noted on 6/46/8032 and also diagnosed with left femoral vein DVT on 6/15 for which she was taken off DAPT and placed on Eliquis, now presented to [facility] ED on 11/03/2021 following an accidental fall when she fell backwards hitting her head while trying to pick up something up off the floor, no LOC, suffered a laceration to the occipital scalp.  No LOC.  Admitted for right femoral neck fracture, orthopedics consulted and plan surgery on 8/2 pending Eliquis washout.  Currently on IV heparin bridging.  Hemoglobin down to 7.2, transfusing 1 unit PRBC 8/1.   Assessment & Plan:   Right hip fracture/displaced right femoral neck fracture: Sustained post mechanical fall.  Surgery was done after Eliquis washout. Underwent right hip hemiarthroplasty on 8/2. Underwent PT and OT evaluation.  Skilled nursing facility for short-term rehab was recommended.   Needs outpatient follow-up with orthopedics in 2 weeks.  Leukocytosis Likely reactive and possibly secondary to volume depletion.   UA was unremarkable.   Low-grade fever noted this morning.  WBC continues to improve.  We will get a chest x-ray.  Constipation Multifactorial.  Aggressive bowel regimen was ordered.  No bowel movement yet.  Will order enema today. Check TSH.  History of femoral DVT 09/18/21 (deep vein thrombosis) Eliquis was held for surgery.  Resumed after surgery.   Essential hypertension Elevated blood pressure readings probably due to pain.  Continue to monitor.  Amlodipine was resumed.   Blood pressure is better controlled now.   Acute blood loss anemia complicating anemia due to stage 4 chronic kidney disease Baseline hemoglobin recently has been fluctuating in the 8-9 range.  Presented with hemoglobin of 9.3.  Hemoglobin gradually drifted down, 9.3 > 8.3 > 7.2, suspected due to fracture site bleeding. No other overt bleeding was noted. Patient transfused 1 unit of PRBC on 8/1.  Hemoglobin responded appropriately. Hemoglobin again drifting down.  Will transfuse additional unit of PRBC.  Once again no overt bleeding is noted.   Acute on chronic kidney disease, stage 4 (severe)/hyponatremia Baseline serum creatinine probably in the 1.7-1.9 range.   Rising creatinine noted to 2.17.  Was hydrated since she has had poor oral intake.  Creatinine has improved and now back to baseline.  Encourage oral intake.  Stop IV fluids.     Hyperlipidemia, unspecified Continue atorvastatin   History of repair of left hip joint June 2023 Left hip hemiarthroplasty for fracture on 09/16/2021.  X-ray was done and noted to be without any acute findings.   History of seizure 09/16/21 Continue Keppra.  Seizure precautions.   History of embolic stroke 04/28/46 Patient was currently on Eliquis so not on DAPT in order to decrease bleeding risk.  Eliquis has been resumed.   Laceration of occipital scalp Related to fall.  Wound care  Vascular dementia  Delirium precautions.  Mentation is stable.  Resume Namenda.   Accidental fall According to niece, patient was walking without her walker when she tried to pick something up and fell backwards hitting her head.  There was  no loss of consciousness.    Pleural effusions on CT Associated atelectasis.  Also has 1-2+ leg edema.  Given a dose of IV Lasix.  Monitor closely.  Follow clinically.  Nutrition Status: Nutrition Problem: Moderate Malnutrition Etiology: chronic illness (CKD4) Signs/Symptoms: mild fat depletion, moderate muscle  depletion Interventions: Ensure Enlive (each supplement provides 350kcal and 20 grams of protein), MVI    DVT prophylaxis:  Eliquis Code Status: DNR Family Communication: No family at bedside.  We will update niece again today. Disposition: Skilled nursing facility for short-term rehab.  Plan was for discharge today however due to need for blood transfusion this will have to be pushed to tomorrow.   Status is: Inpatient Remains inpatient appropriate because: Right hip fracture status post surgery.  PT and OT eval pending.     Consultants:   Orthopedics/Dr. Lyla Glassing  Procedures:   None  Antimicrobials:   None   Subjective:  Remains pleasantly confused.  No complaints offered except for pain in the right hip area.    Objective:   Vitals:   11/08/21 0928 11/08/21 1243 11/08/21 2051 11/09/21 0524  BP: 136/68 130/69 (!) 141/66 (!) 141/71  Pulse:  (!) 104 100 100  Resp:  18 12 16   Temp:  99.3 F (37.4 C) 99.6 F (37.6 C) (!) 100.5 F (38.1 C)  TempSrc:  Oral    SpO2:  99% 95% 100%  Weight:      Height:        General appearance: Awake alert.  In no distress Resp: Clear to auscultation bilaterally.  Normal effort Cardio: S1-S2 is normal regular.  No S3-S4.  No rubs murmurs or bruit GI: Abdomen is soft.  Nontender nondistended.  Bowel sounds are present normal.  No masses organomegaly Extremities: No edema.  Swelling noted in the right thigh area.  Able to move her feet. Neurologic:   No focal neurological deficits.     Data Reviewed:     CBC: Recent Labs  Lab 11/03/21 1608 11/04/21 0612 11/07/21 0436 11/08/21 0406 11/09/21 0510  WBC 10.2   < > 23.3* 16.7* 14.0*  NEUTROABS 6.7  --   --   --   --   HGB 9.3*   < > 9.1* 8.0* 7.2*  HCT 29.5*   < > 27.3* 25.3* 22.4*  MCV 87.5   < > 85.3 86.6 86.5  PLT 436*   < > 281 280 282   < > = values in this interval not displayed.     Basic Metabolic Panel: Recent Labs  Lab 11/05/21 0404 11/06/21 0417  11/07/21 0436 11/08/21 0406 11/09/21 0510  NA 140 139 137 137 133*  K 4.2 4.3 4.8 4.8 5.0  CL 112* 110 107 108 104  CO2 18* 23 22 22  21*  GLUCOSE 113* 114* 134* 96 98  BUN 24* 28* 33* 37* 40*  CREATININE 1.61* 1.87* 2.17* 2.02* 1.84*  CALCIUM 7.9* 7.9* 8.0* 8.1* 7.8*     Liver Function Tests: Recent Labs  Lab 11/03/21 1608  AST 21  ALT 17  ALKPHOS 111  BILITOT 0.6  PROT 7.3  ALBUMIN 3.3*      Microbiology Studies:   Recent Results (from the past 240 hour(s))  Surgical pcr screen     Status: None   Collection Time: 11/05/21  6:31 PM   Specimen: Nasal Mucosa; Nasal Swab  Result Value Ref Range Status   MRSA, PCR NEGATIVE NEGATIVE Final   Staphylococcus aureus NEGATIVE NEGATIVE Final  Comment: (NOTE) The Xpert SA Assay (FDA approved for NASAL specimens in patients 53 years of age and older), is one component of a comprehensive surveillance program. It is not intended to diagnose infection nor to guide or monitor treatment. Performed at [facility], [place] [address], [place], [place] [address]     Radiology Studies:  No results found.  Scheduled Meds:    sodium chloride   Intravenous Once   allopurinol  200 mg Oral q AM   amLODipine  10 mg Oral Daily   apixaban  2.5 mg Oral BID   atorvastatin  20 mg Oral Daily   calcium carbonate  1 tablet Oral Q breakfast   vitamin D3  1,000 Units Oral Daily   docusate sodium  100 mg Oral BID   feeding supplement  237 mL Oral BID BM   levETIRAcetam  250 mg Oral Daily   memantine  5 mg Oral BID   multivitamin with minerals  1 tablet Oral Daily   polyethylene glycol  17 g Oral BID   senna  2 tablet Oral BID    Continuous Infusions:       LOS: 6 days     Bonnielee Haff, MD    11/09/2021, 10:29 AM

## 2021-11-10 DIAGNOSIS — G4700 Insomnia, unspecified: Secondary | ICD-10-CM | POA: Diagnosis not present

## 2021-11-10 DIAGNOSIS — S72001A Fracture of unspecified part of neck of right femur, initial encounter for closed fracture: Secondary | ICD-10-CM | POA: Diagnosis not present

## 2021-11-10 DIAGNOSIS — I6340 Cerebral infarction due to embolism of unspecified cerebral artery: Secondary | ICD-10-CM | POA: Diagnosis not present

## 2021-11-10 DIAGNOSIS — D72829 Elevated white blood cell count, unspecified: Secondary | ICD-10-CM | POA: Diagnosis not present

## 2021-11-10 DIAGNOSIS — F0150 Vascular dementia without behavioral disturbance: Secondary | ICD-10-CM | POA: Diagnosis not present

## 2021-11-10 DIAGNOSIS — M25552 Pain in left hip: Secondary | ICD-10-CM | POA: Diagnosis not present

## 2021-11-10 DIAGNOSIS — F01B11 Vascular dementia, moderate, with agitation: Secondary | ICD-10-CM | POA: Diagnosis not present

## 2021-11-10 DIAGNOSIS — N179 Acute kidney failure, unspecified: Secondary | ICD-10-CM | POA: Diagnosis not present

## 2021-11-10 DIAGNOSIS — D631 Anemia in chronic kidney disease: Secondary | ICD-10-CM | POA: Diagnosis not present

## 2021-11-10 DIAGNOSIS — K5900 Constipation, unspecified: Secondary | ICD-10-CM | POA: Diagnosis not present

## 2021-11-10 DIAGNOSIS — Z96643 Presence of artificial hip joint, bilateral: Secondary | ICD-10-CM | POA: Diagnosis not present

## 2021-11-10 DIAGNOSIS — E7800 Pure hypercholesterolemia, unspecified: Secondary | ICD-10-CM | POA: Diagnosis not present

## 2021-11-10 DIAGNOSIS — Z86718 Personal history of other venous thrombosis and embolism: Secondary | ICD-10-CM | POA: Diagnosis not present

## 2021-11-10 DIAGNOSIS — I10 Essential (primary) hypertension: Secondary | ICD-10-CM | POA: Diagnosis not present

## 2021-11-10 DIAGNOSIS — E559 Vitamin D deficiency, unspecified: Secondary | ICD-10-CM | POA: Diagnosis not present

## 2021-11-10 DIAGNOSIS — Z7401 Bed confinement status: Secondary | ICD-10-CM | POA: Diagnosis not present

## 2021-11-10 DIAGNOSIS — N390 Urinary tract infection, site not specified: Secondary | ICD-10-CM | POA: Diagnosis not present

## 2021-11-10 DIAGNOSIS — I82492 Acute embolism and thrombosis of other specified deep vein of left lower extremity: Secondary | ICD-10-CM | POA: Diagnosis not present

## 2021-11-10 DIAGNOSIS — E785 Hyperlipidemia, unspecified: Secondary | ICD-10-CM | POA: Diagnosis not present

## 2021-11-10 DIAGNOSIS — R531 Weakness: Secondary | ICD-10-CM | POA: Diagnosis not present

## 2021-11-10 DIAGNOSIS — S72031D Displaced midcervical fracture of right femur, subsequent encounter for closed fracture with routine healing: Secondary | ICD-10-CM | POA: Diagnosis not present

## 2021-11-10 DIAGNOSIS — N184 Chronic kidney disease, stage 4 (severe): Secondary | ICD-10-CM | POA: Diagnosis not present

## 2021-11-10 DIAGNOSIS — D649 Anemia, unspecified: Secondary | ICD-10-CM | POA: Diagnosis not present

## 2021-11-10 DIAGNOSIS — Z9181 History of falling: Secondary | ICD-10-CM | POA: Diagnosis not present

## 2021-11-10 DIAGNOSIS — R269 Unspecified abnormalities of gait and mobility: Secondary | ICD-10-CM | POA: Diagnosis not present

## 2021-11-10 DIAGNOSIS — S72001D Fracture of unspecified part of neck of right femur, subsequent encounter for closed fracture with routine healing: Secondary | ICD-10-CM | POA: Diagnosis not present

## 2021-11-10 DIAGNOSIS — M25551 Pain in right hip: Secondary | ICD-10-CM | POA: Diagnosis not present

## 2021-11-10 LAB — TYPE AND SCREEN
ABO/RH(D): O POS
Antibody Screen: NEGATIVE
Unit division: 0

## 2021-11-10 LAB — CBC
HCT: 32.1 % — ABNORMAL LOW (ref 36.0–46.0)
Hemoglobin: 10.4 g/dL — ABNORMAL LOW (ref 12.0–15.0)
MCH: 27.8 pg (ref 26.0–34.0)
MCHC: 32.4 g/dL (ref 30.0–36.0)
MCV: 85.8 fL (ref 80.0–100.0)
Platelets: 331 10*3/uL (ref 150–400)
RBC: 3.74 MIL/uL — ABNORMAL LOW (ref 3.87–5.11)
RDW: 14.2 % (ref 11.5–15.5)
WBC: 16.1 10*3/uL — ABNORMAL HIGH (ref 4.0–10.5)
nRBC: 0 % (ref 0.0–0.2)

## 2021-11-10 LAB — BPAM RBC
Blood Product Expiration Date: 202308272359
ISSUE DATE / TIME: 202308051345
Unit Type and Rh: 5100

## 2021-11-10 LAB — BASIC METABOLIC PANEL
Anion gap: 6 (ref 5–15)
BUN: 42 mg/dL — ABNORMAL HIGH (ref 8–23)
CO2: 24 mmol/L (ref 22–32)
Calcium: 8.6 mg/dL — ABNORMAL LOW (ref 8.9–10.3)
Chloride: 108 mmol/L (ref 98–111)
Creatinine, Ser: 1.91 mg/dL — ABNORMAL HIGH (ref 0.44–1.00)
GFR, Estimated: 23 mL/min — ABNORMAL LOW (ref >60)
Glucose, Bld: 116 mg/dL — ABNORMAL HIGH (ref 70–99)
Potassium: 4.8 mmol/L (ref 3.5–5.1)
Sodium: 138 mmol/L (ref 135–145)

## 2021-11-10 LAB — T4, FREE: Free T4: 1.12 ng/dL (ref 0.61–1.12)

## 2021-11-10 LAB — TSH: TSH: 2.976 u[IU]/mL (ref 0.350–4.500)

## 2021-11-10 MED ORDER — DOCUSATE SODIUM 100 MG PO CAPS: 100.0000 mg | ORAL_CAPSULE | Freq: Two times a day (BID) | ORAL | 0 refills | Status: AC

## 2021-11-10 MED ORDER — SENNA 8.6 MG PO TABS: 2.0000 | ORAL_TABLET | Freq: Two times a day (BID) | ORAL | 0 refills | Status: AC

## 2021-11-10 MED ORDER — ADULT MULTIVITAMIN W/MINERALS CH: 1.0000 | ORAL_TABLET | Freq: Every day | ORAL | Status: AC

## 2021-11-10 MED ORDER — POLYETHYLENE GLYCOL 3350 17 G PO PACK: 17.0000 g | PACK | Freq: Two times a day (BID) | ORAL | 0 refills | Status: AC

## 2021-11-10 MED ORDER — APIXABAN 5 MG PO TABS: 5.0000 mg | ORAL_TABLET | Freq: Two times a day (BID) | ORAL | Status: DC

## 2021-11-10 MED ORDER — ENSURE ENLIVE PO LIQD: 237.0000 mL | Freq: Two times a day (BID) | ORAL | 12 refills | Status: AC

## 2021-11-10 NOTE — TOC Transition Note (Addendum)
Transition of Care [facility]) - CM/SW Discharge Note   Patient Details  Name: UVA RUNKEL MRN: [id number] Date of Birth: 1919-08-28  [facility]) CM/SW Contact:  Dessa Phi, RN Phone Number: 11/10/2021, 12:39 PM   Clinical Narrative:  d/c to [place] aware of d/c today going to rm#[id number] tel#[phone].PTAR called. Forms in printer. No further CM needs.     Final next level of care: Skilled Nursing Facility Barriers to Discharge: No Barriers Identified   Patient Goals and CMS Choice Patient states their goals for this hospitalization and ongoing recovery are:: Home CMS Medicare.gov Compare Post Acute Care list provided to:: Patient Represenative (must comment) Choice offered to / list presented to : Adult Children  Discharge Placement              Patient [facility] Patient to be transferred to facility by: [place] Name of family member notified:  ([name]) Patient and family notified of of transfer: 11/10/21  Discharge Plan and Services   Discharge Planning Services: CM Consult                                 Social Determinants of Health ([place]) Interventions     Readmission Risk Interventions     No data to display

## 2021-11-10 NOTE — Discharge Summary (Signed)
[facility]  Physician Discharge Summary   Patient ID: Tracy [name] MRN: [id number] DOB/AGE: 05-08-1919 88 y.o.  Admit date: 11/03/2021 Discharge date:   11/10/2021   PCP: Charlane Ferretti, MD  DISCHARGE DIAGNOSES:  Principal Problem:   Closed right hip fracture, initial encounter [facility]) Active Problems:   Accidental fall   History of femoral DVT 09/18/21 (deep vein thrombosis)   Laceration of occipital scalp   Essential hypertension   Anemia due to stage 4 chronic kidney disease (HCC)   Hyperlipidemia, unspecified   Vascular dementia (HCC)   Chronic kidney disease, stage 4 (severe) ([place])   History of embolic stroke 2/58/52   History of seizure 09/16/21   History of repair of left hip joint June 2023   Closed right hip fracture (HCC)   Malnutrition of moderate degree   RECOMMENDATIONS FOR OUTPATIENT FOLLOW UP: Please check CBC and basic metabolic panel in 3 to 4 days Follow-up with Dr. Lyla Glassing in 2 weeks   Home Health: Going to SNF Equipment/Devices: None  CODE STATUS: DNR  DISCHARGE CONDITION: fair  Diet recommendation: Regular as tolerated  INITIAL HISTORY:  88 year old female, PMH significant for stage IV CKD with anemia, HTN, recently hospitalized from 09/15/2021 - 10/14/2021 for a left femoral neck fracture, underwent left hip hemiarthroplasty with Dr. Lyla Glassing on 6/12.  She then had a seizure-like activity on 7/78/2423, with an embolic stroke noted on 5/36/1443 and also diagnosed with left femoral vein DVT on 6/15 for which she was taken off DAPT and placed on Eliquis, now presented to the [facility] ED on 11/03/2021 following an accidental fall when she fell backwards hitting her head while trying to pick up something up off the floor, no LOC, suffered a laceration to the occipital scalp.  No LOC.    Consultations: Orthopedics: Dr. Lyla Glassing  Procedures: Right hemiarthroplasty   HOSPITAL COURSE:   Right hip fracture/displaced right femoral  neck fracture: Sustained post mechanical fall.  Surgery was done after Eliquis washout. Underwent right hip hemiarthroplasty on 8/2. Underwent PT and OT evaluation.  Skilled nursing facility for short-term rehab was recommended.   Needs outpatient follow-up with orthopedics in 2 weeks.   Leukocytosis Likely reactive and possibly secondary to volume depletion.   UA was unremarkable.  WBC is stable.  Chest x-ray suggested atelectasis.  Patient without any respiratory symptoms.  Will not initiate antibiotics at this time.   Constipation Multifactorial.  TSH normal.  Had a bowel movement yesterday.  Continue bowel regimen.   History of femoral DVT 09/18/21 (deep vein thrombosis) Eliquis was held for surgery.  Resumed after surgery.   Essential hypertension Continue home medications   Acute blood loss anemia complicating anemia due to stage 4 chronic kidney disease Baseline hemoglobin recently has been fluctuating in the 8-9 range.  Presented with hemoglobin of 9.3.  Hemoglobin gradually drifted down, 9.3 > 8.3 > 7.2, suspected due to fracture site bleeding. No other overt bleeding was noted. Patient required 2 units of PRBC during this hospital stay.  Would recommend hemoglobin be checked at skilled nursing facility in a few days.     Acute on chronic kidney disease, stage 4 (severe)/hyponatremia Baseline serum creatinine probably in the 1.7-1.9 range.   Rising creatinine noted to 2.17.  Patient was hydrated.  Creatinine has improved.  Encourage oral intake.  Avoid nephrotoxic agents.      Hyperlipidemia, unspecified Continue atorvastatin   History of repair of left hip joint June 2023 Left hip hemiarthroplasty for fracture  on 09/16/2021.  X-ray was done and noted to be without any acute findings.   History of seizure 09/16/21 Continue Keppra.     History of embolic stroke 0/24/09 Patient was currently on Eliquis so not on DAPT in order to decrease bleeding risk.  Eliquis has been  resumed.   Laceration of occipital scalp Related to fall.  Wound care   Vascular dementia  Delirium precautions.  Mentation is stable.  Continue Namenda.   Accidental fall According to niece, patient was walking without her walker when she tried to pick something up and fell backwards hitting her head.  There was no loss of consciousness.     Pleural effusions on CT Associated atelectasis.  Also has 1-2+ leg edema.  Given a dose of IV Lasix.  Monitor closely.  Follow clinically.   Moderate protein calorie malnutrition Nutrition Problem: Moderate Malnutrition Etiology: chronic illness (CKD4) Signs/Symptoms: mild fat depletion, moderate muscle depletion Interventions: Ensure Enlive (each supplement provides 350kcal and 20 grams of protein), MVI  Patient is stable.  Okay for discharge to SNF.   PERTINENT LABS:  The results of significant diagnostics from this hospitalization (including imaging, microbiology, ancillary and laboratory) are listed below for reference.    Microbiology: Recent Results (from the past 240 hour(s))  Surgical pcr screen     Status: None   Collection Time: 11/05/21  6:31 PM   Specimen: Nasal Mucosa; Nasal Swab  Result Value Ref Range Status   MRSA, PCR NEGATIVE NEGATIVE Final   Staphylococcus aureus NEGATIVE NEGATIVE Final    Comment: (NOTE) The Xpert SA Assay (FDA approved for NASAL specimens in patients 34 years of age and older), is one component of a comprehensive surveillance program. It is not intended to diagnose infection nor to guide or monitor treatment. Performed at [facility], [place] [address] [place], [place] [address]      Labs:   Basic Metabolic Panel: Recent Labs  Lab 11/06/21 0417 11/07/21 0436 11/08/21 0406 11/09/21 0510 11/10/21 0534  NA 139 137 137 133* 138  K 4.3 4.8 4.8 5.0 4.8  CL 110 107 108 104 108  CO2 23 22 22  21* 24  GLUCOSE 114* 134* 96 98 116*  BUN 28* 33* 37* 40* 42*  CREATININE 1.87*  2.17* 2.02* 1.84* 1.91*  CALCIUM 7.9* 8.0* 8.1* 7.8* 8.6*   Liver Function Tests: Recent Labs  Lab 11/03/21 1608  AST 21  ALT 17  ALKPHOS 111  BILITOT 0.6  PROT 7.3  ALBUMIN 3.3*    CBC: Recent Labs  Lab 11/03/21 1608 11/04/21 0612 11/06/21 0417 11/07/21 0436 11/08/21 0406 11/09/21 0510 11/09/21 1907 11/10/21 0534  WBC 10.2   < > 15.1* 23.3* 16.7* 14.0*  --  16.1*  NEUTROABS 6.7  --   --   --   --   --   --   --   HGB 9.3*   < > 9.0* 9.1* 8.0* 7.2* 12.2 10.4*  HCT 29.5*   < > 27.9* 27.3* 25.3* 22.4* 36.2 32.1*  MCV 87.5   < > 86.1 85.3 86.6 86.5  --  85.8  PLT 436*   < > 310 281 280 282  --  331   < > = values in this interval not displayed.      IMAGING STUDIES DG CHEST PORT 1 VIEW  Result Date: 11/09/2021 CLINICAL DATA:  Shortness of breath post hip surgery. EXAM: PORTABLE CHEST 1 VIEW COMPARISON:  November 03, 2021 FINDINGS: The cardiac silhouette  is enlarged. Tortuosity of the aorta. Low lung volume. Small pleural effusions cannot be excluded. Mild increase interstitial markings. Possible left lower lung airspace consolidation. Osseous structures are without acute abnormality. Soft tissues are grossly normal. IMPRESSION: 1. Enlarged cardiac silhouette. 2. Small pleural effusions cannot be excluded. 3. Mild increase interstitial markings may represent pulmonary vascular congestion. 4. Possible left lower lung airspace consolidation versus atelectasis. Electronically Signed   By: Fidela Salisbury M.D.   On: 11/09/2021 15:02   Pelvis Portable  Result Date: 11/06/2021 CLINICAL DATA:  Postop right hip. EXAM: PORTABLE PELVIS 1-2 VIEWS COMPARISON:  09/16/2021. FINDINGS: Interval right total hip arthroplasty. Joint and subcutaneous air present. Overlying surgical skin staples. Previous left hip arthroplasty on 09/16/2021. IMPRESSION: Interval right hip arthroplasty with expected postoperative findings. Electronically Signed   By: Lorin Picket M.D.   On: 11/06/2021 13:43   DG  HIP PORT UNILAT WITH PELVIS 1V RIGHT  Result Date: 11/06/2021 CLINICAL DATA:  Anterior right hip arthroplasty. Intraoperative fluoroscopy. EXAM: DG HIP (WITH OR WITHOUT PELVIS) 1V PORT RIGHT COMPARISON:  CT right hip 11/03/2021 FINDINGS: Images were performed intraoperatively without the presence of a radiologist. Interval total right hip arthroplasty. Redemonstration of total left hip arthroplasty. No evidence of hardware complication. Total fluoroscopy images: 2 Total fluoroscopy time: 8 seconds Total dose: Radiation Exposure Index (as provided by the fluoroscopic device): 0.6705 mGy air Kerma Please see intraoperative findings for further detail. IMPRESSION: Intraoperative fluoroscopy for total right hip arthroplasty. Electronically Signed   By: Yvonne Kendall M.D.   On: 11/06/2021 12:44   DG C-Arm 1-60 Min-No Report  Result Date: 11/06/2021 Fluoroscopy was utilized by the requesting physician.  No radiographic interpretation.   DG HIP UNILAT WITH PELVIS 1V LEFT  Result Date: 11/06/2021 CLINICAL DATA:  LEFT hip pain EXAM: DG HIP (WITH OR WITHOUT PELVIS) 1V*L* COMPARISON:  None Available. FINDINGS: LEFT hip total arthroplasty.  No dislocation or fracture. IMPRESSION: No acute findings of.  Single view LEFT hip Electronically Signed   By: Suzy Bouchard M.D.   On: 11/06/2021 11:26   DG Knee Right Port  Result Date: 11/04/2021 CLINICAL DATA:  Femoral neck fracture EXAM: PORTABLE RIGHT KNEE - 1-2 VIEW COMPARISON:  Right knee radiographs 01/25/2019 FINDINGS: There is no acute fracture or dislocation. Knee alignment is normal. There is moderate medial and lateral compartment joint space narrowing with associated subchondral sclerosis and osteophytosis consistent with osteoarthritis, worse in the lateral compartment. The soft tissues are unremarkable. There is no effusion. IMPRESSION: 1. No acute fracture or dislocation. 2. Degenerative changes primarily affecting the lateral compartment. Electronically  Signed   By: Valetta Mole M.D.   On: 11/04/2021 08:49   CT Cervical Spine Wo Contrast  Result Date: 11/03/2021 CLINICAL DATA:  Neck trauma. Fall. Hit back of head on door frame. EXAM: CT CERVICAL SPINE WITHOUT CONTRAST TECHNIQUE: Multidetector CT imaging of the cervical spine was performed without intravenous contrast. Multiplanar CT image reconstructions were also generated. RADIATION DOSE REDUCTION: This exam was performed according to the departmental dose-optimization program which includes automated exposure control, adjustment of the mA and/or kV according to patient size and/or use of iterative reconstruction technique. COMPARISON:  None Available. FINDINGS: Alignment: Slight degenerative retrolisthesis present C4-5 and C5-6. No significant listhesis is present otherwise. Straightening of the normal cervical lordosis is present. Skull base and vertebrae: The craniocervical junction is normal. Vertebral body heights are normal. No acute fractures are present. Soft tissues and spinal canal: No prevertebral fluid or swelling. No visible  canal hematoma. Disc levels: Ankylosis is present at C3-4. Uncovertebral and facet hypertrophy contribute to multilevel foraminal stenosis, greatest at C6-7. Upper chest: Left greater than right pleural effusion is present. Dependent atelectasis is associated. Lung apices are otherwise clear. IMPRESSION: 1. No acute fracture or traumatic subluxation. 2. Multilevel degenerative changes of the cervical spine as described. 3. Left greater than right pleural effusions with associated atelectasis. Electronically Signed   By: San Morelle M.D.   On: 11/03/2021 18:26   CT Hip Right Wo Contrast  Result Date: 11/03/2021 CLINICAL DATA:  Trauma. EXAM: CT OF THE RIGHT HIP WITHOUT CONTRAST TECHNIQUE: Multidetector CT imaging of the right hip was performed according to the standard protocol. Multiplanar CT image reconstructions were also generated. RADIATION DOSE REDUCTION:  This exam was performed according to the departmental dose-optimization program which includes automated exposure control, adjustment of the mA and/or kV according to patient size and/or use of iterative reconstruction technique. COMPARISON:  Right hip CT 02/01/2021 FINDINGS: Bones/Joint/Cartilage The bones are osteopenic. There is a nondisplaced subcapital right femoral neck fracture. There is no evidence for dislocation. There are mild degenerative changes of the right hip. There is small joint effusion with fat fluid level. Ligaments Suboptimally assessed by CT. Muscles and Tendons Unremarkable. Soft tissues There is subcutaneous edema lateral to the right hip. No focal hematoma. IMPRESSION: 1. Acute nondisplaced subcapital right femoral neck fracture. Electronically Signed   By: [name] M.D.   On: 11/03/2021 18:25   CT Head Wo Contrast  Result Date: 11/03/2021 CLINICAL DATA:  Head trauma. Hit back of scalp on door frame. EXAM: CT HEAD WITHOUT CONTRAST TECHNIQUE: Contiguous axial images were obtained from the base of the skull through the vertex without intravenous contrast. RADIATION DOSE REDUCTION: This exam was performed according to the departmental dose-optimization program which includes automated exposure control, adjustment of the mA and/or kV according to patient size and/or use of iterative reconstruction technique. COMPARISON:  CT head without contrast 01/29/2021. MR head without contrast 09/18/2021 FINDINGS: Brain: Remote infarcts of the anterior right frontal lobe and right occipital lobe are again seen. Advanced atrophy and white matter disease is present. No acute infarct, hemorrhage, or mass lesion is present. The ventricles are proportionate to the degree of atrophy. No significant extraaxial fluid collection is present. Vascular: Atherosclerotic calcifications are present within the cavernous internal carotid arteries, stable. No hyperdense vessel is present. Skull: Calvarium is  intact. No focal lytic or blastic lesions are present. No significant extracranial soft tissue lesion is present. Sinuses/Orbits: The paranasal sinuses and mastoid air cells are clear. Bilateral lens replacements are noted. Globes and orbits are otherwise unremarkable. IMPRESSION: 1. No acute intracranial abnormality or significant interval change. 2. Stable remote infarcts of the anterior right frontal lobe and right occipital lobe. 3. Stable advanced atrophy and white matter disease. This likely reflects the sequela of chronic microvascular ischemia. Electronically Signed   By: San Morelle M.D.   On: 11/03/2021 18:23   DG Hip Unilat W or Wo Pelvis 2-3 Views Right  Result Date: 11/03/2021 CLINICAL DATA:  Fall, right hip pain EXAM: DG HIP (WITH OR WITHOUT PELVIS) 2-3V RIGHT COMPARISON:  02/01/2021 FINDINGS: Bones are mildly demineralized. No evidence of acute fracture. No dislocation. Moderate degenerative changes of the right hip. Prior left hip arthroplasty. No evidence of pelvic diastasis. IMPRESSION: Negative. Electronically Signed   By: [name] D.O.   On: 11/03/2021 16:52   DG Chest Portable 1 View  Result Date: 11/03/2021 CLINICAL DATA:  Fall. EXAM: PORTABLE CHEST 1 VIEW COMPARISON:  Chest x-ray 09/16/2021 FINDINGS: Patient rotation technically limits the study. The aorta is tortuous and the heart is mildly enlarged, unchanged. There is no focal lung infiltrate. Is no pleural effusion or pneumothorax. No acute fractures. IMPRESSION: 1. Limited examination.  No acute cardiopulmonary process. 2. Stable cardiomegaly. Electronically Signed   By: [name] M.D.   On: 11/03/2021 16:51    DISCHARGE EXAMINATION: Vitals:   11/09/21 1406 11/09/21 1658 11/09/21 2021 11/10/21 0641  BP: (!) 165/74 (!) 175/87 (!) 154/93 139/78  Pulse: (!) 107 (!) 102 (!) 107 99  Resp: 17 18 18 13   Temp: 97.6 F (36.4 C) 98.8 F (37.1 C) (!) 97.4 F (36.3 C) 97.6 F (36.4 C)  TempSrc: Oral Oral     SpO2: 99%  99% 97%  Weight:      Height:       General appearance: Awake alert.  In no distress Resp: Clear to auscultation bilaterally.  Normal effort Cardio: S1-S2 is normal regular.  No S3-S4.  No rubs murmurs or bruit GI: Abdomen is soft.  Nontender nondistended.  Bowel sounds are present normal.  No masses organomegaly    DISPOSITION: SNF  Discharge Instructions     Call MD for:  difficulty breathing, headache or visual disturbances   Complete by: As directed    Call MD for:  extreme fatigue   Complete by: As directed    Call MD for:  persistant dizziness or light-headedness   Complete by: As directed    Call MD for:  persistant nausea and vomiting   Complete by: As directed    Call MD for:  redness, tenderness, or signs of infection (pain, swelling, redness, odor or green/yellow discharge around incision site)   Complete by: As directed    Call MD for:  severe uncontrolled pain   Complete by: As directed    Call MD for:  temperature >100.4   Complete by: As directed    Diet - low sodium heart healthy   Complete by: As directed    Discharge instructions   Complete by: As directed    Please review instructions on the discharge summary.  You were cared for by a hospitalist during your hospital stay. If you have any questions about your discharge medications or the care you received while you were in the hospital after you are discharged, you can call the unit and asked to speak with the hospitalist on call if the hospitalist that took care of you is not available. Once you are discharged, your primary care physician will handle any further medical issues. Please note that NO REFILLS for any discharge medications will be authorized once you are discharged, as it is imperative that you return to your primary care physician (or establish a relationship with a primary care physician if you do not have one) for your aftercare needs so that they can reassess your need for  medications and monitor your lab values. If you do not have a primary care physician, you can call [phone] for a physician referral.   Discharge wound care:   Complete by: As directed    As per orthopedics.   Increase activity slowly   Complete by: As directed           Allergies as of 11/10/2021   No Known Allergies      Medication List     TAKE these medications    acetaminophen 500 MG tablet Commonly known  as: TYLENOL Take 2 tablets (1,000 mg total) by mouth every 8 (eight) hours. What changed:  medication strength how much to take when to take this reasons to take this   allopurinol 100 MG tablet Commonly known as: ZYLOPRIM Take 200 mg by mouth in the morning.   amLODipine 10 MG tablet Commonly known as: NORVASC Take 1 tablet (10 mg total) by mouth daily.   apixaban 5 MG Tabs tablet Commonly known as: ELIQUIS Take 1 tablet (5 mg total) by mouth 2 (two) times daily.   atorvastatin 20 MG tablet Commonly known as: LIPITOR Take 20 mg by mouth daily.   calcium carbonate 1250 (500 Ca) MG tablet Commonly known as: OS-CAL - dosed in mg of elemental calcium Take 1 tablet (500 mg of elemental calcium total) by mouth daily with breakfast.   docusate sodium 100 MG capsule Commonly known as: COLACE Take 1 capsule (100 mg total) by mouth 2 (two) times daily.   feeding supplement Liqd Take 237 mLs by mouth 2 (two) times daily between meals.   Fiber Select Gummies Chew Chew 1-2 tablets by mouth daily as needed (for mild constipation symptoms).   levETIRAcetam 250 MG tablet Commonly known as: KEPPRA Take 1 tablet (250 mg total) by mouth daily.   memantine 5 MG tablet Commonly known as: NAMENDA Take 5 mg by mouth 2 (two) times daily. What changed: Another medication with the same name was removed. Continue taking this medication, and follow the directions you see here.   multivitamin with minerals Tabs tablet Take 1 tablet by mouth daily.   oxyCODONE 5 MG  immediate release tablet Commonly known as: Oxy IR/ROXICODONE Take 1 tablet (5 mg total) by mouth every 6 (six) hours as needed for moderate pain.   pantoprazole 40 MG tablet Commonly known as: PROTONIX Take 40 mg by mouth daily before breakfast.   polyethylene glycol 17 g packet Commonly known as: MIRALAX / GLYCOLAX Take 17 g by mouth 2 (two) times daily.   senna 8.6 MG Tabs tablet Commonly known as: SENOKOT Take 2 tablets (17.2 mg total) by mouth 2 (two) times daily.   vitamin D3 25 MCG tablet Commonly known as: CHOLECALCIFEROL Take 1 tablet (1,000 Units total) by mouth daily.               Discharge Care Instructions  (From admission, onward)           Start     Ordered   11/10/21 0000  Discharge wound care:       Comments: As per orthopedics.   11/10/21 0855              Contact information for follow-up providers     S[place], Aaron Edelman, MD. Schedule an appointment as soon as possible for a visit in 2 week(s).   Specialty: Orthopedic Surgery Why: For wound re-check, For suture removal Contact information: [address] [place] [address] [phone]              Contact information for after-discharge care     [facility] .   Service: Skilled Nursing Contact information: [place]                     TOTAL DISCHARGE TIME: 62 minutes  [place]  [facility] Pager on [email or web address]  11/10/2021, 8:55 AM

## 2021-11-10 NOTE — Progress Notes (Signed)
Attempted to call report to [facility] upon [place] arrival to unit without success, pt's niece [name] notified of transport, personal belongings with patient, IVs x 3 removed.

## 2021-11-10 NOTE — Plan of Care (Signed)
  Problem: Education: Goal: Knowledge of General Education information will improve Description: Including pain rating scale, medication(s)/side effects and non-pharmacologic comfort measures Outcome: Adequate for Discharge   Problem: Health Behavior/Discharge Planning: Goal: Ability to manage health-related needs will improve Outcome: Adequate for Discharge   Problem: Clinical Measurements: Goal: Ability to maintain clinical measurements within normal limits will improve Outcome: Adequate for Discharge Goal: Will remain free from infection Outcome: Adequate for Discharge Goal: Diagnostic test results will improve Outcome: Adequate for Discharge Goal: Respiratory complications will improve Outcome: Adequate for Discharge Goal: Cardiovascular complication will be avoided Outcome: Adequate for Discharge   Problem: Activity: Goal: Risk for activity intolerance will decrease Outcome: Adequate for Discharge   Problem: Nutrition: Goal: Adequate nutrition will be maintained Outcome: Adequate for Discharge   Problem: Coping: Goal: Level of anxiety will decrease Outcome: Adequate for Discharge   Problem: Elimination: Goal: Will not experience complications related to bowel motility Outcome: Adequate for Discharge Goal: Will not experience complications related to urinary retention Outcome: Adequate for Discharge   Problem: Pain Managment: Goal: General experience of comfort will improve Outcome: Adequate for Discharge   Problem: Safety: Goal: Ability to remain free from injury will improve Outcome: Adequate for Discharge   Problem: Skin Integrity: Goal: Risk for impaired skin integrity will decrease Outcome: Adequate for Discharge   Problem: Education: Goal: Verbalization of understanding the information provided (i.e., activity precautions, restrictions, etc) will improve Outcome: Adequate for Discharge   Problem: Activity: Goal: Ability to ambulate and perform ADLs  will improve Outcome: Adequate for Discharge   Problem: Clinical Measurements: Goal: Postoperative complications will be avoided or minimized Outcome: Adequate for Discharge   Problem: Self-Concept: Goal: Ability to maintain and perform role responsibilities to the fullest extent possible will improve Outcome: Adequate for Discharge   Problem: Pain Management: Goal: Pain level will decrease Outcome: Adequate for Discharge

## 2021-11-11 DIAGNOSIS — I10 Essential (primary) hypertension: Secondary | ICD-10-CM | POA: Diagnosis not present

## 2021-11-11 DIAGNOSIS — N184 Chronic kidney disease, stage 4 (severe): Secondary | ICD-10-CM | POA: Diagnosis not present

## 2021-11-11 DIAGNOSIS — D649 Anemia, unspecified: Secondary | ICD-10-CM | POA: Diagnosis not present

## 2021-11-11 DIAGNOSIS — R269 Unspecified abnormalities of gait and mobility: Secondary | ICD-10-CM | POA: Diagnosis not present

## 2021-11-13 DIAGNOSIS — S72001D Fracture of unspecified part of neck of right femur, subsequent encounter for closed fracture with routine healing: Secondary | ICD-10-CM | POA: Diagnosis not present

## 2021-11-13 DIAGNOSIS — I10 Essential (primary) hypertension: Secondary | ICD-10-CM | POA: Diagnosis not present

## 2021-11-13 DIAGNOSIS — I6340 Cerebral infarction due to embolism of unspecified cerebral artery: Secondary | ICD-10-CM | POA: Diagnosis not present

## 2021-11-13 DIAGNOSIS — Z9181 History of falling: Secondary | ICD-10-CM | POA: Diagnosis not present

## 2021-11-25 ENCOUNTER — Inpatient Hospital Stay: Payer: Medicare PPO | Admitting: Neurology

## 2021-11-26 DIAGNOSIS — S72031D Displaced midcervical fracture of right femur, subsequent encounter for closed fracture with routine healing: Secondary | ICD-10-CM | POA: Diagnosis not present

## 2021-11-27 ENCOUNTER — Inpatient Hospital Stay: Payer: Medicare PPO | Admitting: Neurology

## 2021-11-28 DIAGNOSIS — F0150 Vascular dementia without behavioral disturbance: Secondary | ICD-10-CM | POA: Diagnosis not present

## 2021-11-28 DIAGNOSIS — S72001D Fracture of unspecified part of neck of right femur, subsequent encounter for closed fracture with routine healing: Secondary | ICD-10-CM | POA: Diagnosis not present

## 2021-12-03 DIAGNOSIS — F01B11 Vascular dementia, moderate, with agitation: Secondary | ICD-10-CM | POA: Diagnosis not present

## 2021-12-03 DIAGNOSIS — G4700 Insomnia, unspecified: Secondary | ICD-10-CM | POA: Diagnosis not present

## 2021-12-05 DIAGNOSIS — R2681 Unsteadiness on feet: Secondary | ICD-10-CM | POA: Diagnosis not present

## 2021-12-05 DIAGNOSIS — F0150 Vascular dementia without behavioral disturbance: Secondary | ICD-10-CM | POA: Diagnosis not present

## 2021-12-05 DIAGNOSIS — R2689 Other abnormalities of gait and mobility: Secondary | ICD-10-CM | POA: Diagnosis not present

## 2021-12-05 DIAGNOSIS — S72061A Displaced articular fracture of head of right femur, initial encounter for closed fracture: Secondary | ICD-10-CM | POA: Diagnosis not present

## 2021-12-12 ENCOUNTER — Inpatient Hospital Stay: Payer: Medicare PPO | Admitting: Neurology

## 2021-12-12 DIAGNOSIS — Z4789 Encounter for other orthopedic aftercare: Secondary | ICD-10-CM | POA: Diagnosis not present

## 2021-12-12 DIAGNOSIS — S72001D Fracture of unspecified part of neck of right femur, subsequent encounter for closed fracture with routine healing: Secondary | ICD-10-CM | POA: Diagnosis not present

## 2021-12-12 DIAGNOSIS — I6340 Cerebral infarction due to embolism of unspecified cerebral artery: Secondary | ICD-10-CM | POA: Diagnosis not present

## 2021-12-12 DIAGNOSIS — M6259 Muscle wasting and atrophy, not elsewhere classified, multiple sites: Secondary | ICD-10-CM | POA: Diagnosis not present

## 2021-12-12 DIAGNOSIS — F0150 Vascular dementia without behavioral disturbance: Secondary | ICD-10-CM | POA: Diagnosis not present

## 2021-12-12 DIAGNOSIS — I10 Essential (primary) hypertension: Secondary | ICD-10-CM | POA: Diagnosis not present

## 2021-12-12 DIAGNOSIS — R296 Repeated falls: Secondary | ICD-10-CM | POA: Diagnosis not present

## 2021-12-12 DIAGNOSIS — Z741 Need for assistance with personal care: Secondary | ICD-10-CM | POA: Diagnosis not present

## 2021-12-13 DIAGNOSIS — I10 Essential (primary) hypertension: Secondary | ICD-10-CM | POA: Diagnosis not present

## 2021-12-13 DIAGNOSIS — F0150 Vascular dementia without behavioral disturbance: Secondary | ICD-10-CM | POA: Diagnosis not present

## 2021-12-13 DIAGNOSIS — Z741 Need for assistance with personal care: Secondary | ICD-10-CM | POA: Diagnosis not present

## 2021-12-13 DIAGNOSIS — Z4789 Encounter for other orthopedic aftercare: Secondary | ICD-10-CM | POA: Diagnosis not present

## 2021-12-13 DIAGNOSIS — S72001D Fracture of unspecified part of neck of right femur, subsequent encounter for closed fracture with routine healing: Secondary | ICD-10-CM | POA: Diagnosis not present

## 2021-12-13 DIAGNOSIS — M6259 Muscle wasting and atrophy, not elsewhere classified, multiple sites: Secondary | ICD-10-CM | POA: Diagnosis not present

## 2021-12-13 DIAGNOSIS — R296 Repeated falls: Secondary | ICD-10-CM | POA: Diagnosis not present

## 2021-12-13 DIAGNOSIS — I6340 Cerebral infarction due to embolism of unspecified cerebral artery: Secondary | ICD-10-CM | POA: Diagnosis not present

## 2021-12-16 DIAGNOSIS — Z4789 Encounter for other orthopedic aftercare: Secondary | ICD-10-CM | POA: Diagnosis not present

## 2021-12-16 DIAGNOSIS — I6340 Cerebral infarction due to embolism of unspecified cerebral artery: Secondary | ICD-10-CM | POA: Diagnosis not present

## 2021-12-16 DIAGNOSIS — M6259 Muscle wasting and atrophy, not elsewhere classified, multiple sites: Secondary | ICD-10-CM | POA: Diagnosis not present

## 2021-12-16 DIAGNOSIS — R296 Repeated falls: Secondary | ICD-10-CM | POA: Diagnosis not present

## 2021-12-16 DIAGNOSIS — S72001D Fracture of unspecified part of neck of right femur, subsequent encounter for closed fracture with routine healing: Secondary | ICD-10-CM | POA: Diagnosis not present

## 2021-12-16 DIAGNOSIS — Z741 Need for assistance with personal care: Secondary | ICD-10-CM | POA: Diagnosis not present

## 2021-12-16 DIAGNOSIS — F01B11 Vascular dementia, moderate, with agitation: Secondary | ICD-10-CM | POA: Diagnosis not present

## 2021-12-16 DIAGNOSIS — Z043 Encounter for examination and observation following other accident: Secondary | ICD-10-CM | POA: Diagnosis not present

## 2021-12-16 DIAGNOSIS — I10 Essential (primary) hypertension: Secondary | ICD-10-CM | POA: Diagnosis not present

## 2021-12-16 DIAGNOSIS — F0150 Vascular dementia without behavioral disturbance: Secondary | ICD-10-CM | POA: Diagnosis not present

## 2021-12-17 DIAGNOSIS — Z741 Need for assistance with personal care: Secondary | ICD-10-CM | POA: Diagnosis not present

## 2021-12-17 DIAGNOSIS — M6259 Muscle wasting and atrophy, not elsewhere classified, multiple sites: Secondary | ICD-10-CM | POA: Diagnosis not present

## 2021-12-17 DIAGNOSIS — R296 Repeated falls: Secondary | ICD-10-CM | POA: Diagnosis not present

## 2021-12-17 DIAGNOSIS — Z4789 Encounter for other orthopedic aftercare: Secondary | ICD-10-CM | POA: Diagnosis not present

## 2021-12-17 DIAGNOSIS — S72001D Fracture of unspecified part of neck of right femur, subsequent encounter for closed fracture with routine healing: Secondary | ICD-10-CM | POA: Diagnosis not present

## 2021-12-17 DIAGNOSIS — I10 Essential (primary) hypertension: Secondary | ICD-10-CM | POA: Diagnosis not present

## 2021-12-17 DIAGNOSIS — I6340 Cerebral infarction due to embolism of unspecified cerebral artery: Secondary | ICD-10-CM | POA: Diagnosis not present

## 2021-12-17 DIAGNOSIS — F419 Anxiety disorder, unspecified: Secondary | ICD-10-CM | POA: Diagnosis not present

## 2021-12-17 DIAGNOSIS — F0150 Vascular dementia without behavioral disturbance: Secondary | ICD-10-CM | POA: Diagnosis not present

## 2021-12-18 DIAGNOSIS — I6340 Cerebral infarction due to embolism of unspecified cerebral artery: Secondary | ICD-10-CM | POA: Diagnosis not present

## 2021-12-18 DIAGNOSIS — R296 Repeated falls: Secondary | ICD-10-CM | POA: Diagnosis not present

## 2021-12-18 DIAGNOSIS — R8290 Unspecified abnormal findings in urine: Secondary | ICD-10-CM | POA: Diagnosis not present

## 2021-12-18 DIAGNOSIS — Z741 Need for assistance with personal care: Secondary | ICD-10-CM | POA: Diagnosis not present

## 2021-12-18 DIAGNOSIS — F0150 Vascular dementia without behavioral disturbance: Secondary | ICD-10-CM | POA: Diagnosis not present

## 2021-12-18 DIAGNOSIS — E7800 Pure hypercholesterolemia, unspecified: Secondary | ICD-10-CM | POA: Diagnosis not present

## 2021-12-18 DIAGNOSIS — I10 Essential (primary) hypertension: Secondary | ICD-10-CM | POA: Diagnosis not present

## 2021-12-18 DIAGNOSIS — M6259 Muscle wasting and atrophy, not elsewhere classified, multiple sites: Secondary | ICD-10-CM | POA: Diagnosis not present

## 2021-12-18 DIAGNOSIS — N390 Urinary tract infection, site not specified: Secondary | ICD-10-CM | POA: Diagnosis not present

## 2021-12-18 DIAGNOSIS — Z4789 Encounter for other orthopedic aftercare: Secondary | ICD-10-CM | POA: Diagnosis not present

## 2021-12-18 DIAGNOSIS — S72001D Fracture of unspecified part of neck of right femur, subsequent encounter for closed fracture with routine healing: Secondary | ICD-10-CM | POA: Diagnosis not present

## 2021-12-19 DIAGNOSIS — F01B11 Vascular dementia, moderate, with agitation: Secondary | ICD-10-CM | POA: Diagnosis not present

## 2021-12-19 DIAGNOSIS — I6340 Cerebral infarction due to embolism of unspecified cerebral artery: Secondary | ICD-10-CM | POA: Diagnosis not present

## 2021-12-19 DIAGNOSIS — S72001D Fracture of unspecified part of neck of right femur, subsequent encounter for closed fracture with routine healing: Secondary | ICD-10-CM | POA: Diagnosis not present

## 2021-12-19 DIAGNOSIS — M6259 Muscle wasting and atrophy, not elsewhere classified, multiple sites: Secondary | ICD-10-CM | POA: Diagnosis not present

## 2021-12-19 DIAGNOSIS — N390 Urinary tract infection, site not specified: Secondary | ICD-10-CM | POA: Diagnosis not present

## 2021-12-19 DIAGNOSIS — R296 Repeated falls: Secondary | ICD-10-CM | POA: Diagnosis not present

## 2021-12-19 DIAGNOSIS — E7800 Pure hypercholesterolemia, unspecified: Secondary | ICD-10-CM | POA: Diagnosis not present

## 2021-12-19 DIAGNOSIS — F0150 Vascular dementia without behavioral disturbance: Secondary | ICD-10-CM | POA: Diagnosis not present

## 2021-12-19 DIAGNOSIS — G4700 Insomnia, unspecified: Secondary | ICD-10-CM | POA: Diagnosis not present

## 2021-12-19 DIAGNOSIS — Z741 Need for assistance with personal care: Secondary | ICD-10-CM | POA: Diagnosis not present

## 2021-12-19 DIAGNOSIS — I10 Essential (primary) hypertension: Secondary | ICD-10-CM | POA: Diagnosis not present

## 2021-12-19 DIAGNOSIS — Z4789 Encounter for other orthopedic aftercare: Secondary | ICD-10-CM | POA: Diagnosis not present

## 2021-12-20 DIAGNOSIS — F0150 Vascular dementia without behavioral disturbance: Secondary | ICD-10-CM | POA: Diagnosis not present

## 2021-12-20 DIAGNOSIS — M6259 Muscle wasting and atrophy, not elsewhere classified, multiple sites: Secondary | ICD-10-CM | POA: Diagnosis not present

## 2021-12-20 DIAGNOSIS — S72001D Fracture of unspecified part of neck of right femur, subsequent encounter for closed fracture with routine healing: Secondary | ICD-10-CM | POA: Diagnosis not present

## 2021-12-20 DIAGNOSIS — Z741 Need for assistance with personal care: Secondary | ICD-10-CM | POA: Diagnosis not present

## 2021-12-20 DIAGNOSIS — R296 Repeated falls: Secondary | ICD-10-CM | POA: Diagnosis not present

## 2021-12-20 DIAGNOSIS — I10 Essential (primary) hypertension: Secondary | ICD-10-CM | POA: Diagnosis not present

## 2021-12-20 DIAGNOSIS — Z4789 Encounter for other orthopedic aftercare: Secondary | ICD-10-CM | POA: Diagnosis not present

## 2021-12-20 DIAGNOSIS — I6340 Cerebral infarction due to embolism of unspecified cerebral artery: Secondary | ICD-10-CM | POA: Diagnosis not present

## 2021-12-23 DIAGNOSIS — F0150 Vascular dementia without behavioral disturbance: Secondary | ICD-10-CM | POA: Diagnosis not present

## 2021-12-23 DIAGNOSIS — S72001D Fracture of unspecified part of neck of right femur, subsequent encounter for closed fracture with routine healing: Secondary | ICD-10-CM | POA: Diagnosis not present

## 2021-12-23 DIAGNOSIS — Z741 Need for assistance with personal care: Secondary | ICD-10-CM | POA: Diagnosis not present

## 2021-12-23 DIAGNOSIS — M6259 Muscle wasting and atrophy, not elsewhere classified, multiple sites: Secondary | ICD-10-CM | POA: Diagnosis not present

## 2021-12-23 DIAGNOSIS — R296 Repeated falls: Secondary | ICD-10-CM | POA: Diagnosis not present

## 2021-12-23 DIAGNOSIS — I10 Essential (primary) hypertension: Secondary | ICD-10-CM | POA: Diagnosis not present

## 2021-12-23 DIAGNOSIS — Z4789 Encounter for other orthopedic aftercare: Secondary | ICD-10-CM | POA: Diagnosis not present

## 2021-12-23 DIAGNOSIS — I6340 Cerebral infarction due to embolism of unspecified cerebral artery: Secondary | ICD-10-CM | POA: Diagnosis not present

## 2021-12-24 DIAGNOSIS — I10 Essential (primary) hypertension: Secondary | ICD-10-CM | POA: Diagnosis not present

## 2021-12-24 DIAGNOSIS — R296 Repeated falls: Secondary | ICD-10-CM | POA: Diagnosis not present

## 2021-12-24 DIAGNOSIS — M6259 Muscle wasting and atrophy, not elsewhere classified, multiple sites: Secondary | ICD-10-CM | POA: Diagnosis not present

## 2021-12-24 DIAGNOSIS — I6340 Cerebral infarction due to embolism of unspecified cerebral artery: Secondary | ICD-10-CM | POA: Diagnosis not present

## 2021-12-24 DIAGNOSIS — F0150 Vascular dementia without behavioral disturbance: Secondary | ICD-10-CM | POA: Diagnosis not present

## 2021-12-24 DIAGNOSIS — Z4789 Encounter for other orthopedic aftercare: Secondary | ICD-10-CM | POA: Diagnosis not present

## 2021-12-24 DIAGNOSIS — Z741 Need for assistance with personal care: Secondary | ICD-10-CM | POA: Diagnosis not present

## 2021-12-24 DIAGNOSIS — S72001D Fracture of unspecified part of neck of right femur, subsequent encounter for closed fracture with routine healing: Secondary | ICD-10-CM | POA: Diagnosis not present

## 2021-12-25 DIAGNOSIS — S72001D Fracture of unspecified part of neck of right femur, subsequent encounter for closed fracture with routine healing: Secondary | ICD-10-CM | POA: Diagnosis not present

## 2021-12-25 DIAGNOSIS — I6340 Cerebral infarction due to embolism of unspecified cerebral artery: Secondary | ICD-10-CM | POA: Diagnosis not present

## 2021-12-25 DIAGNOSIS — M6259 Muscle wasting and atrophy, not elsewhere classified, multiple sites: Secondary | ICD-10-CM | POA: Diagnosis not present

## 2021-12-25 DIAGNOSIS — I10 Essential (primary) hypertension: Secondary | ICD-10-CM | POA: Diagnosis not present

## 2021-12-25 DIAGNOSIS — Z741 Need for assistance with personal care: Secondary | ICD-10-CM | POA: Diagnosis not present

## 2021-12-25 DIAGNOSIS — G4700 Insomnia, unspecified: Secondary | ICD-10-CM | POA: Diagnosis not present

## 2021-12-25 DIAGNOSIS — Z4789 Encounter for other orthopedic aftercare: Secondary | ICD-10-CM | POA: Diagnosis not present

## 2021-12-25 DIAGNOSIS — F0150 Vascular dementia without behavioral disturbance: Secondary | ICD-10-CM | POA: Diagnosis not present

## 2021-12-25 DIAGNOSIS — R296 Repeated falls: Secondary | ICD-10-CM | POA: Diagnosis not present

## 2021-12-25 DIAGNOSIS — F01B11 Vascular dementia, moderate, with agitation: Secondary | ICD-10-CM | POA: Diagnosis not present

## 2021-12-26 DIAGNOSIS — F0150 Vascular dementia without behavioral disturbance: Secondary | ICD-10-CM | POA: Diagnosis not present

## 2021-12-26 DIAGNOSIS — F4323 Adjustment disorder with mixed anxiety and depressed mood: Secondary | ICD-10-CM | POA: Diagnosis not present

## 2021-12-26 DIAGNOSIS — Z4789 Encounter for other orthopedic aftercare: Secondary | ICD-10-CM | POA: Diagnosis not present

## 2021-12-26 DIAGNOSIS — S72001D Fracture of unspecified part of neck of right femur, subsequent encounter for closed fracture with routine healing: Secondary | ICD-10-CM | POA: Diagnosis not present

## 2021-12-26 DIAGNOSIS — Z741 Need for assistance with personal care: Secondary | ICD-10-CM | POA: Diagnosis not present

## 2021-12-26 DIAGNOSIS — I6340 Cerebral infarction due to embolism of unspecified cerebral artery: Secondary | ICD-10-CM | POA: Diagnosis not present

## 2021-12-26 DIAGNOSIS — I10 Essential (primary) hypertension: Secondary | ICD-10-CM | POA: Diagnosis not present

## 2021-12-26 DIAGNOSIS — M6259 Muscle wasting and atrophy, not elsewhere classified, multiple sites: Secondary | ICD-10-CM | POA: Diagnosis not present

## 2021-12-26 DIAGNOSIS — R296 Repeated falls: Secondary | ICD-10-CM | POA: Diagnosis not present

## 2021-12-27 DIAGNOSIS — R296 Repeated falls: Secondary | ICD-10-CM | POA: Diagnosis not present

## 2021-12-27 DIAGNOSIS — I6340 Cerebral infarction due to embolism of unspecified cerebral artery: Secondary | ICD-10-CM | POA: Diagnosis not present

## 2021-12-27 DIAGNOSIS — M6259 Muscle wasting and atrophy, not elsewhere classified, multiple sites: Secondary | ICD-10-CM | POA: Diagnosis not present

## 2021-12-27 DIAGNOSIS — I10 Essential (primary) hypertension: Secondary | ICD-10-CM | POA: Diagnosis not present

## 2021-12-27 DIAGNOSIS — S72001D Fracture of unspecified part of neck of right femur, subsequent encounter for closed fracture with routine healing: Secondary | ICD-10-CM | POA: Diagnosis not present

## 2021-12-27 DIAGNOSIS — F0150 Vascular dementia without behavioral disturbance: Secondary | ICD-10-CM | POA: Diagnosis not present

## 2021-12-27 DIAGNOSIS — Z4789 Encounter for other orthopedic aftercare: Secondary | ICD-10-CM | POA: Diagnosis not present

## 2021-12-27 DIAGNOSIS — Z741 Need for assistance with personal care: Secondary | ICD-10-CM | POA: Diagnosis not present

## 2021-12-28 DIAGNOSIS — R296 Repeated falls: Secondary | ICD-10-CM | POA: Diagnosis not present

## 2021-12-28 DIAGNOSIS — I10 Essential (primary) hypertension: Secondary | ICD-10-CM | POA: Diagnosis not present

## 2021-12-28 DIAGNOSIS — Z741 Need for assistance with personal care: Secondary | ICD-10-CM | POA: Diagnosis not present

## 2021-12-28 DIAGNOSIS — I6340 Cerebral infarction due to embolism of unspecified cerebral artery: Secondary | ICD-10-CM | POA: Diagnosis not present

## 2021-12-28 DIAGNOSIS — Z4789 Encounter for other orthopedic aftercare: Secondary | ICD-10-CM | POA: Diagnosis not present

## 2021-12-28 DIAGNOSIS — M6259 Muscle wasting and atrophy, not elsewhere classified, multiple sites: Secondary | ICD-10-CM | POA: Diagnosis not present

## 2021-12-28 DIAGNOSIS — S72001D Fracture of unspecified part of neck of right femur, subsequent encounter for closed fracture with routine healing: Secondary | ICD-10-CM | POA: Diagnosis not present

## 2021-12-28 DIAGNOSIS — F0150 Vascular dementia without behavioral disturbance: Secondary | ICD-10-CM | POA: Diagnosis not present

## 2021-12-30 ENCOUNTER — Emergency Department (HOSPITAL_COMMUNITY): Payer: Medicare PPO

## 2021-12-30 ENCOUNTER — Inpatient Hospital Stay (HOSPITAL_COMMUNITY)
Admission: EM | Admit: 2021-12-30 | Discharge: 2022-01-05 | DRG: 193 | Disposition: E | Payer: Medicare PPO | Source: Skilled Nursing Facility | Attending: Internal Medicine | Admitting: Internal Medicine

## 2021-12-30 DIAGNOSIS — Z741 Need for assistance with personal care: Secondary | ICD-10-CM | POA: Diagnosis not present

## 2021-12-30 DIAGNOSIS — F0150 Vascular dementia without behavioral disturbance: Secondary | ICD-10-CM | POA: Diagnosis not present

## 2021-12-30 DIAGNOSIS — H53462 Homonymous bilateral field defects, left side: Secondary | ICD-10-CM | POA: Diagnosis not present

## 2021-12-30 DIAGNOSIS — N179 Acute kidney failure, unspecified: Secondary | ICD-10-CM | POA: Diagnosis present

## 2021-12-30 DIAGNOSIS — I129 Hypertensive chronic kidney disease with stage 1 through stage 4 chronic kidney disease, or unspecified chronic kidney disease: Secondary | ICD-10-CM | POA: Diagnosis present

## 2021-12-30 DIAGNOSIS — N189 Chronic kidney disease, unspecified: Secondary | ICD-10-CM

## 2021-12-30 DIAGNOSIS — J811 Chronic pulmonary edema: Secondary | ICD-10-CM | POA: Diagnosis present

## 2021-12-30 DIAGNOSIS — E860 Dehydration: Secondary | ICD-10-CM | POA: Diagnosis present

## 2021-12-30 DIAGNOSIS — E785 Hyperlipidemia, unspecified: Secondary | ICD-10-CM | POA: Diagnosis present

## 2021-12-30 DIAGNOSIS — R0602 Shortness of breath: Secondary | ICD-10-CM | POA: Diagnosis not present

## 2021-12-30 DIAGNOSIS — Z7901 Long term (current) use of anticoagulants: Secondary | ICD-10-CM

## 2021-12-30 DIAGNOSIS — R55 Syncope and collapse: Secondary | ICD-10-CM | POA: Diagnosis not present

## 2021-12-30 DIAGNOSIS — R569 Unspecified convulsions: Secondary | ICD-10-CM | POA: Diagnosis present

## 2021-12-30 DIAGNOSIS — Z4789 Encounter for other orthopedic aftercare: Secondary | ICD-10-CM | POA: Diagnosis not present

## 2021-12-30 DIAGNOSIS — F01B0 Vascular dementia, moderate, without behavioral disturbance, psychotic disturbance, mood disturbance, and anxiety: Secondary | ICD-10-CM

## 2021-12-30 DIAGNOSIS — J189 Pneumonia, unspecified organism: Secondary | ICD-10-CM | POA: Diagnosis not present

## 2021-12-30 DIAGNOSIS — N184 Chronic kidney disease, stage 4 (severe): Secondary | ICD-10-CM | POA: Diagnosis not present

## 2021-12-30 DIAGNOSIS — Z66 Do not resuscitate: Secondary | ICD-10-CM | POA: Diagnosis not present

## 2021-12-30 DIAGNOSIS — Z96643 Presence of artificial hip joint, bilateral: Secondary | ICD-10-CM | POA: Diagnosis present

## 2021-12-30 DIAGNOSIS — Z8673 Personal history of transient ischemic attack (TIA), and cerebral infarction without residual deficits: Secondary | ICD-10-CM | POA: Diagnosis not present

## 2021-12-30 DIAGNOSIS — I6930 Unspecified sequelae of cerebral infarction: Secondary | ICD-10-CM | POA: Diagnosis not present

## 2021-12-30 DIAGNOSIS — Z20822 Contact with and (suspected) exposure to covid-19: Secondary | ICD-10-CM | POA: Diagnosis present

## 2021-12-30 DIAGNOSIS — Z86718 Personal history of other venous thrombosis and embolism: Secondary | ICD-10-CM

## 2021-12-30 DIAGNOSIS — E8770 Fluid overload, unspecified: Secondary | ICD-10-CM | POA: Diagnosis present

## 2021-12-30 DIAGNOSIS — D631 Anemia in chronic kidney disease: Secondary | ICD-10-CM | POA: Diagnosis not present

## 2021-12-30 DIAGNOSIS — Z87898 Personal history of other specified conditions: Secondary | ICD-10-CM

## 2021-12-30 DIAGNOSIS — Z515 Encounter for palliative care: Secondary | ICD-10-CM | POA: Diagnosis not present

## 2021-12-30 DIAGNOSIS — R0989 Other specified symptoms and signs involving the circulatory and respiratory systems: Secondary | ICD-10-CM | POA: Diagnosis not present

## 2021-12-30 DIAGNOSIS — M6259 Muscle wasting and atrophy, not elsewhere classified, multiple sites: Secondary | ICD-10-CM | POA: Diagnosis not present

## 2021-12-30 DIAGNOSIS — J9601 Acute respiratory failure with hypoxia: Secondary | ICD-10-CM | POA: Diagnosis present

## 2021-12-30 DIAGNOSIS — S72001D Fracture of unspecified part of neck of right femur, subsequent encounter for closed fracture with routine healing: Secondary | ICD-10-CM | POA: Diagnosis not present

## 2021-12-30 DIAGNOSIS — M109 Gout, unspecified: Secondary | ICD-10-CM | POA: Diagnosis present

## 2021-12-30 DIAGNOSIS — F01511 Vascular dementia, unspecified severity, with agitation: Secondary | ICD-10-CM | POA: Diagnosis present

## 2021-12-30 DIAGNOSIS — I10 Essential (primary) hypertension: Secondary | ICD-10-CM | POA: Diagnosis not present

## 2021-12-30 DIAGNOSIS — R001 Bradycardia, unspecified: Secondary | ICD-10-CM | POA: Diagnosis not present

## 2021-12-30 DIAGNOSIS — I959 Hypotension, unspecified: Secondary | ICD-10-CM | POA: Diagnosis not present

## 2021-12-30 DIAGNOSIS — E782 Mixed hyperlipidemia: Secondary | ICD-10-CM | POA: Diagnosis present

## 2021-12-30 DIAGNOSIS — R0603 Acute respiratory distress: Secondary | ICD-10-CM | POA: Diagnosis not present

## 2021-12-30 DIAGNOSIS — M810 Age-related osteoporosis without current pathological fracture: Secondary | ICD-10-CM | POA: Diagnosis present

## 2021-12-30 DIAGNOSIS — I6340 Cerebral infarction due to embolism of unspecified cerebral artery: Secondary | ICD-10-CM | POA: Diagnosis not present

## 2021-12-30 DIAGNOSIS — Z79899 Other long term (current) drug therapy: Secondary | ICD-10-CM

## 2021-12-30 DIAGNOSIS — R0902 Hypoxemia: Secondary | ICD-10-CM | POA: Diagnosis not present

## 2021-12-30 DIAGNOSIS — R296 Repeated falls: Secondary | ICD-10-CM | POA: Diagnosis not present

## 2021-12-30 LAB — CBC WITH DIFFERENTIAL/PLATELET
Abs Immature Granulocytes: 0.12 10*3/uL — ABNORMAL HIGH (ref 0.00–0.07)
Basophils Absolute: 0 10*3/uL (ref 0.0–0.1)
Basophils Relative: 0 %
Eosinophils Absolute: 0 10*3/uL (ref 0.0–0.5)
Eosinophils Relative: 0 %
HCT: 28.1 % — ABNORMAL LOW (ref 36.0–46.0)
Hemoglobin: 9 g/dL — ABNORMAL LOW (ref 12.0–15.0)
Immature Granulocytes: 1 %
Lymphocytes Relative: 3 %
Lymphs Abs: 0.6 10*3/uL — ABNORMAL LOW (ref 0.7–4.0)
MCH: 27.2 pg (ref 26.0–34.0)
MCHC: 32 g/dL (ref 30.0–36.0)
MCV: 84.9 fL (ref 80.0–100.0)
Monocytes Absolute: 0.9 10*3/uL (ref 0.1–1.0)
Monocytes Relative: 5 %
Neutro Abs: 18.2 10*3/uL — ABNORMAL HIGH (ref 1.7–7.7)
Neutrophils Relative %: 91 %
Platelets: 517 10*3/uL — ABNORMAL HIGH (ref 150–400)
RBC: 3.31 MIL/uL — ABNORMAL LOW (ref 3.87–5.11)
RDW: 16.8 % — ABNORMAL HIGH (ref 11.5–15.5)
WBC: 19.9 10*3/uL — ABNORMAL HIGH (ref 4.0–10.5)
nRBC: 0.1 % (ref 0.0–0.2)

## 2021-12-30 LAB — COMPREHENSIVE METABOLIC PANEL
ALT: 14 U/L (ref 0–44)
AST: 44 U/L — ABNORMAL HIGH (ref 15–41)
Albumin: 2.5 g/dL — ABNORMAL LOW (ref 3.5–5.0)
Alkaline Phosphatase: 92 U/L (ref 38–126)
Anion gap: 10 (ref 5–15)
BUN: 40 mg/dL — ABNORMAL HIGH (ref 8–23)
CO2: 20 mmol/L — ABNORMAL LOW (ref 22–32)
Calcium: 8.5 mg/dL — ABNORMAL LOW (ref 8.9–10.3)
Chloride: 109 mmol/L (ref 98–111)
Creatinine, Ser: 2.54 mg/dL — ABNORMAL HIGH (ref 0.44–1.00)
GFR, Estimated: 16 mL/min — ABNORMAL LOW (ref >60)
Glucose, Bld: 119 mg/dL — ABNORMAL HIGH (ref 70–99)
Potassium: 5.6 mmol/L — ABNORMAL HIGH (ref 3.5–5.1)
Sodium: 139 mmol/L (ref 135–145)
Total Bilirubin: 0.8 mg/dL (ref 0.3–1.2)
Total Protein: 6.4 g/dL — ABNORMAL LOW (ref 6.5–8.1)

## 2021-12-30 LAB — RESP PANEL BY RT-PCR (FLU A&B, COVID) ARPGX2
Influenza A by PCR: NEGATIVE
Influenza B by PCR: NEGATIVE
SARS Coronavirus 2 by RT PCR: NEGATIVE

## 2021-12-30 LAB — BRAIN NATRIURETIC PEPTIDE: B Natriuretic Peptide: 270.6 pg/mL — ABNORMAL HIGH (ref 0.0–100.0)

## 2021-12-30 MED ORDER — SENNA 8.6 MG PO TABS: 2.0000 | ORAL_TABLET | Freq: Two times a day (BID) | ORAL | Status: DC

## 2021-12-30 MED ORDER — AMLODIPINE BESYLATE 5 MG PO TABS: 10.0000 mg | ORAL_TABLET | Freq: Every day | ORAL | Status: DC

## 2021-12-30 MED ORDER — SODIUM CHLORIDE 0.9 % IV SOLN
1.0000 g | Freq: Once | INTRAVENOUS | Status: AC
  Administered 2021-12-30: 1 g via INTRAVENOUS
  Filled 2021-12-30: qty 10

## 2021-12-30 MED ORDER — ALBUTEROL SULFATE (2.5 MG/3ML) 0.083% IN NEBU
2.5000 mg | INHALATION_SOLUTION | Freq: Once | RESPIRATORY_TRACT | Status: AC
  Administered 2021-12-30: 2.5 mg via RESPIRATORY_TRACT
  Filled 2021-12-30: qty 3

## 2021-12-30 MED ORDER — APIXABAN 5 MG PO TABS: 5.0000 mg | ORAL_TABLET | Freq: Two times a day (BID) | ORAL | Status: DC

## 2021-12-30 MED ORDER — POLYETHYLENE GLYCOL 3350 17 G PO PACK: 17.0000 g | PACK | Freq: Two times a day (BID) | ORAL | Status: DC

## 2021-12-30 MED ORDER — VANCOMYCIN HCL IN DEXTROSE 1-5 GM/200ML-% IV SOLN
1000.0000 mg | Freq: Once | INTRAVENOUS | Status: AC
  Administered 2021-12-30: 1000 mg via INTRAVENOUS
  Filled 2021-12-30: qty 200

## 2021-12-30 MED ORDER — MELATONIN 5 MG PO TABS: 5.0000 mg | ORAL_TABLET | Freq: Every day | ORAL | Status: DC

## 2021-12-30 MED ORDER — SODIUM CHLORIDE 0.9 % IV SOLN
1.0000 g | INTRAVENOUS | Status: DC
  Filled 2021-12-30: qty 10

## 2021-12-30 MED ORDER — VANCOMYCIN VARIABLE DOSE PER UNSTABLE RENAL FUNCTION (PHARMACIST DOSING): Status: DC

## 2021-12-30 MED ORDER — FERROUS SULFATE 325 (65 FE) MG PO TABS: 325.0000 mg | ORAL_TABLET | Freq: Every day | ORAL | Status: DC

## 2021-12-30 MED ORDER — PANTOPRAZOLE SODIUM 40 MG PO TBEC: 40.0000 mg | DELAYED_RELEASE_TABLET | Freq: Every day | ORAL | Status: DC

## 2021-12-30 MED ORDER — FUROSEMIDE 10 MG/ML IJ SOLN
20.0000 mg | Freq: Once | INTRAMUSCULAR | Status: AC
  Administered 2021-12-30: 20 mg via INTRAVENOUS
  Filled 2021-12-30: qty 2

## 2021-12-30 MED ORDER — VITAMIN D 25 MCG (1000 UNIT) PO TABS: 1000.0000 [IU] | ORAL_TABLET | Freq: Every day | ORAL | Status: DC

## 2021-12-30 MED ORDER — NITROGLYCERIN 2 % TD OINT
0.5000 [in_us] | TOPICAL_OINTMENT | Freq: Once | TRANSDERMAL | Status: AC
  Administered 2021-12-30: 0.5 [in_us] via TOPICAL
  Filled 2021-12-30: qty 1

## 2021-12-30 MED ORDER — ALLOPURINOL 100 MG PO TABS
200.0000 mg | ORAL_TABLET | Freq: Every day | ORAL | Status: DC
  Filled 2021-12-30: qty 2

## 2021-12-30 MED ORDER — HEPARIN (PORCINE) 25000 UT/250ML-% IV SOLN
850.0000 [IU]/h | INTRAVENOUS | Status: DC
  Administered 2021-12-31: 900 [IU]/h via INTRAVENOUS
  Filled 2021-12-30: qty 250

## 2021-12-30 MED ORDER — MEMANTINE HCL 5 MG PO TABS
5.0000 mg | ORAL_TABLET | Freq: Two times a day (BID) | ORAL | Status: DC
  Filled 2021-12-30 (×2): qty 1

## 2021-12-30 MED ORDER — SODIUM CHLORIDE 0.9 % IV SOLN
250.0000 mg | INTRAVENOUS | Status: DC
  Administered 2021-12-31: 250 mg via INTRAVENOUS
  Filled 2021-12-30: qty 2.5

## 2021-12-30 MED ORDER — MIRTAZAPINE 15 MG PO TABS: 15.0000 mg | ORAL_TABLET | Freq: Every day | ORAL | Status: DC

## 2021-12-30 MED ORDER — CALCIUM CARBONATE 1250 (500 CA) MG PO TABS
1.0000 | ORAL_TABLET | Freq: Every day | ORAL | Status: DC
  Filled 2021-12-30: qty 1

## 2021-12-30 MED ORDER — ATORVASTATIN CALCIUM 10 MG PO TABS: 20.0000 mg | ORAL_TABLET | Freq: Every day | ORAL | Status: DC

## 2021-12-30 NOTE — Assessment & Plan Note (Signed)
Patient currently alert and oriented to self and place only. -Continue home medications pending med rec.

## 2021-12-30 NOTE — Progress Notes (Signed)
RT placed pt on BIPAP per verbal order from R. Vanita Panda, MD. Pt is tolerating BIPAP settings well at this time. RT will continue to monitor.

## 2021-12-30 NOTE — Assessment & Plan Note (Signed)
Continue statin. 

## 2021-12-30 NOTE — Assessment & Plan Note (Signed)
Hemoglobin is stable around her baseline at 9.  Continue iron supplementation.

## 2021-12-30 NOTE — Progress Notes (Addendum)
ANTICOAGULATION CONSULT NOTE - Initial Consult  Pharmacy Consult:  Heparin Indication: History of DVT  No Known Allergies  Patient Measurements: Weight: 43.5 kg (95 lb 14.4 oz) Heparin Dosing Weight: 43 kg  Vital Signs: Temp: 99 F (37.2 C) (09/25 1816) Temp Source: Axillary (09/25 1816) BP: 111/59 (09/25 2315) Pulse Rate: 81 (09/25 2315)  Labs: Recent Labs    01/04/2022 1738  HGB 9.0*  HCT 28.1*  PLT 517*  CREATININE 2.54*    Estimated Creatinine Clearance: 7.2 mL/min (A) (by C-G formula based on SCr of 2.54 mg/dL (H)).   Medical History: Past Medical History:  Diagnosis Date   Acute stroke due to occlusion of left posterior cerebral artery ([place]) 01/30/2021   Anemia due to stage 4 chronic kidney disease ([place]) 01/30/2021   Cerebral infarction due to carotid artery occlusion ([place]) 09/16/2021   Chronic kidney disease, stage 4 (severe) ([place]) 01/30/2021   Essential hypertension 01/30/2021   History of gout 09/16/2021   History of Right Occipital stroke ([place]) 09/17/2021   Left homonymous hemianopsia 09/17/2021   History of right occipital stroke 10/2020   Mixed hyperlipidemia 01/30/2021   Occipital stroke ([place]) 2022   Osteoporosis 09/17/2021    Assessment: 89 YOF presented with respiratory distress.  She has a history of DVT in June 2536 and embolic CVA.  Pharmacy consulted to dose IV heparin while Eliquis is on hold (last dose 9/25 at 0900).  Baseline labs reviewed.  Based on admission in July/August 2023, patient requires up to 1000 units/hr of IV heparin.  Goal of Therapy:  Heparin level 0.3-0.7 units/ml aPTT 66-102 seconds Monitor platelets by anticoagulation protocol: Yes    Plan:  Start IV heparin at 900 units/hr - no bolus with recent Eliquis use Check 8 hr aPTT and heparin level Daily heparin level, aPTT and CBC  Billye Pickerel D. Mina Marble, PharmD, BCPS, [place] 12/07/2021, 11:50 PM

## 2021-12-30 NOTE — Progress Notes (Addendum)
Pharmacy Antibiotic Note  Tracy [name] is a 88 y.o. female admitted on 12/10/2021 with pneumonia.  Pharmacy has been consulted for Vancomycin dosing.  Cefepime 1g IV x 1 ordered in the ED.  WBC 19.9, Tmax 99, RR 24 SCr 2.54 (baseline Scr 1.7-1.9)  Plan: Initiate loading dose of Vancomycin 1000mg  IV x 1, followed by  Vancomycin variable dosing per levels    > Goal trough vancomycin level: 15-20 Monitor daily CBC, temp, SCr, and for clinical signs of improvement  Order MRSA PCR F/u cultures and de-escalate antibiotics as able     Temp (24hrs), Avg:99 F (37.2 C), Min:99 F (37.2 C), Max:99 F (37.2 C)  Recent Labs  Lab 01/01/2022 1738  WBC 19.9*  CREATININE 2.54*    CrCl cannot be calculated (Unknown ideal weight.).    No Known Allergies  Antimicrobials this admission: Cefepime 9/25 >>  Vancomycin 9/25 >>   Dose adjustments this admission: N/A  Microbiology results: 9/25 MRSA PCR: ordered  Thank you for allowing pharmacy to be a part of this patient's care.  Luisa Hart, PharmD, BCPS Clinical Pharmacist 01/03/2022 6:14 PM   Please refer to AMION for pharmacy phone number   ____________________________________________  Addendum: Pharmacy consulted to dose cefepime in addition to vancomycin for pneumonia.    Start Cefepime 1g IV q24h  Continue Vancomycin variable dosing per levels Monitor renal function and adjust antibiotic doses accordingly.   Luisa Hart, PharmD, BCPS Clinical Pharmacist 01/02/2022 11:16 PM

## 2021-12-30 NOTE — Assessment & Plan Note (Signed)
Not able to take PO now. Transition Eliquis to IV heparin.

## 2021-12-30 NOTE — Assessment & Plan Note (Addendum)
Also has hx of recent DVT. Not able to take PO now. On IV heparin.

## 2021-12-30 NOTE — ED Provider Notes (Signed)
[facility] EMERGENCY DEPARTMENT Provider Note   CSN: [id number] Arrival date & time: 12/20/2021  1704     History  Chief Complaint  Patient presents with   Respiratory Distress    Tracy [name] is a 88 y.o. female.  HPI Patient presents from nursing facility via EMS with concern for respiratory distress.  Patient is incapable of providing her own history, has history of dementia, but does typically interact, he is alert, oriented to self, is conversational.  Staff reports the patient has been less interactive over the past few days, and today EMS reports that on their arrival the patient was 78% on typical oxygen supplementation.  With nonrebreather mask saturation was 90%.  After initial evaluation I discussed patient presentation with her niece.  Niece notes the patient had some wheezing a few days ago, but was otherwise interacting in a simple manner.  Niece confirmed goals of care, DNR, but to proceed with evaluation for possible reversible causes of respiratory distress.    Home Medications Prior to Admission medications   Medication Sig Start Date End Date Taking? Authorizing Provider  allopurinol (ZYLOPRIM) 100 MG tablet Take 200 mg by mouth in the morning.    [provider]  amLODipine (NORVASC) 10 MG tablet Take 1 tablet (10 mg total) by mouth daily. 09/24/21   Alcus Dad, MD  apixaban (ELIQUIS) 5 MG TABS tablet Take 1 tablet (5 mg total) by mouth 2 (two) times daily. 09/20/21   Hill, Marciano Sequin, PA-C  atorvastatin (LIPITOR) 20 MG tablet Take 20 mg by mouth daily.    [provider]  calcium carbonate (OS-CAL - DOSED IN MG OF ELEMENTAL CALCIUM) 1250 (500 Ca) MG tablet Take 1 tablet (500 mg of elemental calcium total) by mouth daily with breakfast. 09/25/21   Alcus Dad, MD  cholecalciferol (VITAMIN D) 25 MCG tablet Take 1 tablet (1,000 Units total) by mouth daily. 09/25/21   Alcus Dad, MD  docusate sodium (COLACE) 100 MG capsule  Take 1 capsule (100 mg total) by mouth 2 (two) times daily. 11/10/21   Bonnielee Haff, MD  feeding supplement (ENSURE ENLIVE / ENSURE PLUS) LIQD Take 237 mLs by mouth 2 (two) times daily between meals. 11/10/21   Bonnielee Haff, MD  Fiber Select Gummies CHEW Chew 1-2 tablets by mouth daily as needed (for mild constipation symptoms).    [provider]  levETIRAcetam (KEPPRA) 250 MG tablet Take 1 tablet (250 mg total) by mouth daily. 09/25/21   Alcus Dad, MD  memantine (NAMENDA) 5 MG tablet Take 5 mg by mouth 2 (two) times daily. 10/27/21   [provider]  Multiple Vitamin (MULTIVITAMIN WITH MINERALS) TABS tablet Take 1 tablet by mouth daily. 11/10/21   Bonnielee Haff, MD  oxyCODONE (OXY IR/ROXICODONE) 5 MG immediate release tablet Take 1 tablet (5 mg total) by mouth every 6 (six) hours as needed for moderate pain. 11/07/21   Swinteck, Aaron Edelman, MD  pantoprazole (PROTONIX) 40 MG tablet Take 40 mg by mouth daily before breakfast.    [provider]  polyethylene glycol (MIRALAX / GLYCOLAX) 17 g packet Take 17 g by mouth 2 (two) times daily. 11/10/21   Bonnielee Haff, MD  senna (SENOKOT) 8.6 MG TABS tablet Take 2 tablets (17.2 mg total) by mouth 2 (two) times daily. 11/10/21   Bonnielee Haff, MD      Allergies    Patient has no known allergies.    Review of Systems   Review of Systems  Unable  to perform ROS: Acuity of condition    Physical Exam Updated Vital Signs BP (!) 120/49   Pulse 79   Temp 99 F (37.2 C) (Axillary)   Resp (!) 26   SpO2 97%  Physical Exam Vitals and nursing note reviewed.  Constitutional:      General: She is in acute distress.     Appearance: She is well-developed. She is ill-appearing and diaphoretic.  HENT:     Head: Normocephalic and atraumatic.  Eyes:     Conjunctiva/sclera: Conjunctivae normal.  Cardiovascular:     Rate and Rhythm: Regular rhythm. Tachycardia present.  Pulmonary:     Effort: Respiratory distress present.      Breath sounds: No stridor. Wheezing and rales present.  Abdominal:     General: There is no distension.     Tenderness: There is no guarding.  Skin:    General: Skin is warm.  Neurological:     Cranial Nerves: No cranial nerve deficit.     Comments: Moves extremities minimally, does not follow commands  Psychiatric:     Comments: Withdrawn     ED Results / Procedures / Treatments   Labs (all labs ordered are listed, but only abnormal results are displayed) Labs Reviewed  COMPREHENSIVE METABOLIC PANEL - Abnormal; Notable for the following components:      Result Value   Potassium 5.6 (*)    CO2 20 (*)    Glucose, Bld 119 (*)    BUN 40 (*)    Creatinine, Ser 2.54 (*)    Calcium 8.5 (*)    Total Protein 6.4 (*)    Albumin 2.5 (*)    AST 44 (*)    GFR, Estimated 16 (*)    All other components within normal limits  CBC WITH DIFFERENTIAL/PLATELET - Abnormal; Notable for the following components:   WBC 19.9 (*)    RBC 3.31 (*)    Hemoglobin 9.0 (*)    HCT 28.1 (*)    RDW 16.8 (*)    Platelets 517 (*)    Neutro Abs 18.2 (*)    Lymphs Abs 0.6 (*)    Abs Immature Granulocytes 0.12 (*)    All other components within normal limits  BRAIN NATRIURETIC PEPTIDE - Abnormal; Notable for the following components:   B Natriuretic Peptide 270.6 (*)    All other components within normal limits  RESP PANEL BY RT-PCR (FLU A&B, COVID) ARPGX2  MRSA NEXT GEN BY PCR, NASAL  CBC  BASIC METABOLIC PANEL    EKG EKG Interpretation  Date/Time:  Monday December 30 2021 17:17:35 EDT Ventricular Rate:  68 PR Interval:    QRS Duration: 87 QT Interval:  619 QTC Calculation: 659 R Axis:   133 Text Interpretation: Atrial fibrillation Abnormal ECG Right axis deviation Low voltage, precordial leads Consider anterior infarct ST-t wave abnormality Confirmed by Carmin Muskrat [phone]) on 12/17/2021 7:08:46 PM  Radiology DG Chest Port 1 View  Result Date: 12/14/2021 CLINICAL DATA:  Shortness of  breath EXAM: PORTABLE CHEST 1 VIEW COMPARISON:  11/09/2021 FINDINGS: Cardiomegaly. Heterogeneous consolidation throughout the right lung with underlying diffuse bilateral interstitial pulmonary opacity. Osseous structures unremarkable. IMPRESSION: 1. Heterogeneous consolidation throughout the right lung with underlying diffuse bilateral interstitial pulmonary opacity, most consistent with multifocal infection, and/or edema. 2.  Cardiomegaly. Electronically Signed   By: Delanna Ahmadi M.D.   On: 01/02/2022 17:37    Procedures Procedures    Medications Ordered in ED Medications  vancomycin variable dose per unstable renal  function (pharmacist dosing) (has no administration in time range)  furosemide (LASIX) injection 20 mg (20 mg Intravenous Given 12/19/2021 1734)  albuterol (PROVENTIL) (2.5 MG/3ML) 0.083% nebulizer solution 2.5 mg (2.5 mg Nebulization Given 12/08/2021 1746)  nitroGLYCERIN (NITROGLYN) 2 % ointment 0.5 inch (0.5 inches Topical Given 12/16/2021 1739)  ceFEPIme (MAXIPIME) 1 g in sodium chloride 0.9 % 100 mL IVPB (0 g Intravenous Stopped 12/23/2021 1902)  vancomycin (VANCOCIN) IVPB 1000 mg/200 mL premix (0 mg Intravenous Stopped 12/20/2021 2017)    ED Course/ Medical Decision Making/ A&P This patient with a Hx of hypertension, dementia, prior stroke presents to the ED for concern of respiratory distress, this involves an extensive number of treatment options, and is a complaint that carries with it a high risk of complications and morbidity.    The differential diagnosis includes pneumonia, COVID, heart failure exacerbation, bacteremia, sepsis.   Social Determinants of Health:  Advanced age, dementia  Additional history obtained:  Additional history and/or information obtained from niece, EMS, notable for details included above   After the initial evaluation, orders, including: X-ray, topical nitroglycerin, Lasix, noninvasive ventilatory support with BiPAP were initiated.   Patient  placed on Cardiac and Pulse-Oximetry Monitors. The patient was maintained on a cardiac monitor.  The cardiac monitored showed an rhythm of 80 sinus normal The patient was also maintained on pulse oximetry. The readings were typically 90% on supplemental oxygen abnormal   On repeat evaluation of the patient improved After 40 minutes with BiPAP the patient was markedly improved Lab Tests:  I personally interpreted labs.  The pertinent results include: Leukocytosis worsening renal function, mild hyperkalemia  Imaging Studies ordered:  I independently visualized and interpreted imaging which showed right-sided opacification with concern for infection versus congestion I agree with the radiologist interpretation  8:49 PM Patient markedly improved, but an attempt at removing BiPAP resulted in hypoxia.  With return to BiPAP patient's oxygenation was normal, she is calm.  I again discussed findings with the patient's niece, who notes that she has her own health concerns, not currently able to come to the hospital can do so tomorrow, probably.  He is aware of all findings thus far, including pneumonia, concern for heart failure, and worsening overall function.  Dispostion / Final MDM:  After consideration of the diagnostic results and the patient's response to treatment, this adult female presents with respiratory distress.  Patient's medical history includes dementia, renal dysfunction, and here she is found to have evidence for both pneumonia and fluid overload status, complicating resuscitative efforts.  Patient improved markedly, however, was much calmer, with tachypnea that resolved, hypoxia that resolved, though both required BiPAP.  Patient is DNR, but after discussion with family, effort towards treating what may be a reversible process, as above is the goal of care for now, though this may be reassessed tomorrow.  Patient admitted to stepdown unit.  Final Clinical Impression(s) / ED  Diagnoses Final diagnoses:  Respiratory distress  CRITICAL CARE Performed by: Carmin Muskrat Total critical care time: 45 minutes Critical care time was exclusive of separately billable procedures and treating other patients. Critical care was necessary to treat or prevent imminent or life-threatening deterioration. Critical care was time spent personally by me on the following activities: development of treatment plan with patient and/or surrogate as well as nursing, discussions with consultants, evaluation of patient's response to treatment, examination of patient, obtaining history from patient or surrogate, ordering and performing treatments and interventions, ordering and review of laboratory studies, ordering and review  of radiographic studies, pulse oximetry and re-evaluation of patient's condition.    Carmin Muskrat, MD 12/13/2021 2051

## 2021-12-30 NOTE — Assessment & Plan Note (Signed)
Hypoxia down to 70s per EMS requiring Bipap on presentation -CXR concerning for multifocal pneumonia and possible edema. Also has b/l LE edema.  -continue IV vancomycin and IV Rocephin -Has received a dose of IV 20 mg Lasix in the ED.  Will follow intake and output and clinically to see if she needs further dosing given AKI.  She has no known diagnosis of CHF.  Last echocardiogram in June with a EF around 55%.  Addendum  Pt later had worsening respiratory status requiring increase FiO2 and pressure setting on Bipap. Evaluated at bedside and she is more restless with worsening labored respiration. Lungs with diffuse ronchi worse than prior. RN has tried repositioning pt to be more upright and there are no secretions to suction. Updated Niece over the phone and discuss extensively about poor prognosis and pt's high likelihood to die tonight. Discussed options of Bipap and trial of low dose Ativan vs comfort measures with morphine. Niece and her husband wants [place] to continue with Bipap. They still do NOT want any intubation and mechanical ventilation. Floor coverage MD updated on pt's changes at my shift departure.

## 2021-12-30 NOTE — Assessment & Plan Note (Signed)
Transition oral Keppra to IV given she is n.p.o. on BiPAP

## 2021-12-30 NOTE — ED Triage Notes (Signed)
Pt bib [facility] from [facility] memory care unit with complaints of shob that started today per staff. EMS found pt sating 78% on her 3LNC on arrival.  Pt arrives on NRB sating 90% and only responding to pain with rales and rhonchi throughout. Pt placed on bipap on arrival

## 2021-12-30 NOTE — Assessment & Plan Note (Signed)
AKI on CKD 4.  Creatinine elevated 2.54 from prior 1.9. -likely component of dehydration but she also has possible pulmonology edema and LE edema on exam. Has received one time dose of IV Lasix in ED. Will follow creatinine with AM labs. -avoid other neprotoxic agents at this time

## 2021-12-30 NOTE — H&P (Addendum)
History and Physical    Patient: Tracy [name] MVE:[id number] DOB: Apr 17, 1919 DOA: 12/23/2021 DOS: the patient was seen and examined on 01/01/2022 PCP: Charlane Ferretti, MD  Patient coming from: SNF  Chief Complaint:  Chief Complaint  Patient presents with   Respiratory Distress   HPI: Tracy [name] is a 88 y.o. female with medical history significant of dementia, embolic CVA, DVT on 2/83/6629 on Eliquis, CKD 4, hypertension, seizure, chronic anemia who presents from SNF with respiratory distress.  Patient with dementia and unable to provide history.  Reportedly staff noted she has been less interactive for the past few days and EMS reported she was hypoxic down to 78% on her typical oxygen supplementation.  She was requiring BiPAP in the ED.  Has leukocytosis with WBC of 19.9, stable chronic anemia with hemoglobin of 9. Creatinine elevated 2.54 up from previous range around 1-2.  BNP of 270.  Negative flu/COVID PCR. Chest x-ray concerning for bilateral multifocal pneumonia and possible edema. Review of Systems: unable to review all systems due to the inability of the patient to answer questions. Past Medical History:  Diagnosis Date   Acute stroke due to occlusion of left posterior cerebral artery ([place]) 01/30/2021   Anemia due to stage 4 chronic kidney disease ([place]) 01/30/2021   Cerebral infarction due to carotid artery occlusion ([facility]) 09/16/2021   Chronic kidney disease, stage 4 (severe) ([place]) 01/30/2021   Essential hypertension 01/30/2021   History of gout 09/16/2021   History of Right Occipital stroke ([place]) 09/17/2021   Left homonymous hemianopsia 09/17/2021   History of right occipital stroke 10/2020   Mixed hyperlipidemia 01/30/2021   Occipital stroke ([place]) 2022   Osteoporosis 09/17/2021   Past Surgical History:  Procedure Laterality Date   ANTERIOR APPROACH HEMI HIP ARTHROPLASTY Left 09/16/2021   Procedure: ANTERIOR APPROACH HEMI HIP ARTHROPLASTY;  Surgeon: Rod Can,  MD;  Location: [facility];  Service: Orthopedics;  Laterality: Left;   ANTERIOR APPROACH HEMI HIP ARTHROPLASTY Right 11/06/2021   Procedure: ANTERIOR APPROACH HEMI HIP ARTHROPLASTY;  Surgeon: Rod Can, MD;  Location: [facility];  Service: Orthopedics;  Laterality: Right;   Social History:  reports that she has never smoked. She has never used smokeless tobacco. She reports current alcohol use. She reports that she does not use drugs.  No Known Allergies  Family History  Problem Relation Age of Onset   Cancer Mother    Dementia Sister    Heart disease Neg Hx     Prior to Admission medications   Medication Sig Start Date End Date Taking? Authorizing Provider  acetaminophen (TYLENOL) 500 MG tablet Take 1,000 mg by mouth every 8 (eight) hours.   Yes [provider]  Allopurinol 200 MG TABS Take 200 mg by mouth in the morning.   Yes [provider]  amLODipine (NORVASC) 10 MG tablet Take 1 tablet (10 mg total) by mouth daily. 09/24/21  Yes Alcus Dad, MD  apixaban (ELIQUIS) 5 MG TABS tablet Take 1 tablet (5 mg total) by mouth 2 (two) times daily. 09/20/21  Yes Hill, Marciano Sequin, PA-C  atorvastatin (LIPITOR) 20 MG tablet Take 20 mg by mouth daily.   Yes [provider]  calcium carbonate (OS-CAL - DOSED IN MG OF ELEMENTAL CALCIUM) 1250 (500 Ca) MG tablet Take 1 tablet (500 mg of elemental calcium total) by mouth daily with breakfast. 09/25/21  Yes Alcus Dad, MD  cholecalciferol (VITAMIN D) 25 MCG tablet Take 1 tablet (1,000 Units total) by mouth daily.  09/25/21  Yes Alcus Dad, MD  ferrous sulfate 325 (65 FE) MG tablet Take 325 mg by mouth daily with lunch.   Yes [provider]  Fiber Select Gummies CHEW Chew 1 tablet by mouth daily as needed (for mild constipation symptoms).   Yes [provider]  levETIRAcetam (KEPPRA) 250 MG tablet Take 1 tablet (250 mg total) by mouth daily. 09/25/21  Yes Alcus Dad, MD  melatonin 5 MG TABS Take 5 mg by  mouth at bedtime.   Yes [provider]  memantine (NAMENDA) 5 MG tablet Take 5 mg by mouth in the morning and at bedtime. 10/27/21  Yes [provider]  mirtazapine (REMERON) 15 MG tablet Take 15 mg by mouth at bedtime.   Yes [provider]  pantoprazole (PROTONIX) 40 MG tablet Take 40 mg by mouth daily before breakfast.   Yes [provider]  polyethylene glycol (MIRALAX / GLYCOLAX) 17 g packet Take 17 g by mouth 2 (two) times daily. 11/10/21  Yes Bonnielee Haff, MD  senna (SENOKOT) 8.6 MG TABS tablet Take 2 tablets (17.2 mg total) by mouth 2 (two) times daily. 11/10/21  Yes Bonnielee Haff, MD  docusate sodium (COLACE) 100 MG capsule Take 1 capsule (100 mg total) by mouth 2 (two) times daily. Patient not taking: Reported on 12/15/2021 11/10/21   Bonnielee Haff, MD  feeding supplement (ENSURE ENLIVE / ENSURE PLUS) LIQD Take 237 mLs by mouth 2 (two) times daily between meals. Patient not taking: Reported on 12/14/2021 11/10/21   Bonnielee Haff, MD  Multiple Vitamin (MULTIVITAMIN WITH MINERALS) TABS tablet Take 1 tablet by mouth daily. Patient not taking: Reported on 01/02/2022 11/10/21   Bonnielee Haff, MD  oxyCODONE (OXY IR/ROXICODONE) 5 MG immediate release tablet Take 1 tablet (5 mg total) by mouth every 6 (six) hours as needed for moderate pain. Patient not taking: Reported on 12/06/2021 11/07/21   Rod Can, MD    Physical Exam: Vitals:   2022/01/15 0245 2022/01/15 0300 15-Jan-2022 0315 01-15-2022 0318  BP: 121/68 105/64 112/72   Pulse: 87 89 88   Resp: (!) 23 20 13    Temp:    (!) 97.5 F (36.4 C)  TempSrc:    Axillary  SpO2: 92% 93% 92%   Weight:       Constitutional: NAD, calm, comfortable thin nontoxic-appearing elderly female appearing younger than stated age laying in bed on BiPAP Eyes: lids and conjunctivae normal ENMT: Mucous membranes are moist. .  Neck: normal, supple,  Respiratory: Diffuse rhonchi on anterior auscultation while on BiPAP normal  respiratory effort. No accessory muscle use.  Cardiovascular: Regular rate and rhythm, no murmurs / rubs / gallops.  +2 pitting edema bilateral distal lower extremity Abdomen: Soft, nontender.  Bowel sounds positive.  Musculoskeletal: no clubbing / cyanosis. No joint deformity upper and lower extremities.  Skin: no rashes, lesions, ulcers. No induration Neurologic: CN 2-12 grossly intact.  Psychiatric: Alert and oriented to self and place only. Normal mood. Data Reviewed:  HPI  Assessment and Plan: * Acute respiratory failure with hypoxia ([place]) Hypoxia down to 70s per EMS requiring Bipap on presentation -CXR concerning for multifocal pneumonia and possible edema. Also has b/l LE edema.  -continue IV vancomycin and IV Rocephin -Has received a dose of IV 20 mg Lasix in the ED.  Will follow intake and output and clinically to see if she needs further dosing given AKI.  She has no known diagnosis of CHF.  Last echocardiogram in June with a EF  around 55%.  Addendum  Pt later had worsening respiratory status requiring increase FiO2 and pressure setting on Bipap. Evaluated at bedside and she is more restless with worsening labored respiration. Lungs with diffuse ronchi worse than prior. RN has tried repositioning pt to be more upright and there are no secretions to suction. Updated Niece over the phone and discuss extensively about poor prognosis and pt's high likelihood to die tonight. Discussed options of Bipap and trial of low dose Ativan vs comfort measures with morphine. Niece and her husband wants [place] to continue with Bipap. They still do NOT want any intubation and mechanical ventilation. Floor coverage MD updated on pt's changes at my shift departure.   History of femoral DVT 09/18/21 (deep vein thrombosis) Not able to take PO now. Transition Eliquis to IV heparin.  Anemia due to stage 4 chronic kidney disease (HCC) Hemoglobin is stable around her baseline at 9.  Continue iron  supplementation.  Hyperlipidemia, unspecified Continue statin  Acute kidney injury superimposed on chronic kidney disease ([place]) AKI on CKD 4.  Creatinine elevated 2.54 from prior 1.9. -likely component of dehydration but she also has possible pulmonology edema and LE edema on exam. Has received one time dose of IV Lasix in ED. Will follow creatinine with AM labs. -avoid other neprotoxic agents at this time  History of seizure 09/16/21 Transition oral Keppra to IV given she is n.p.o. on BiPAP  History of embolic stroke 0/34/91 Also has hx of recent DVT. Not able to take PO now. On IV heparin.  Vascular dementia [facility]) Patient currently alert and oriented to self and place only. -Continue home medications pending med rec.      Advance Care Planning:   Code Status: DNR documentation at bedside. Also confirmed with Niece who has HCPOA.   Consults: None  Family Communication: Discussed with niece whose husband is HCPOA.   Severity of Illness: The appropriate patient status for this patient is INPATIENT. Inpatient status is judged to be reasonable and necessary in order to provide the required intensity of service to ensure the patient's safety. The patient's presenting symptoms, physical exam findings, and initial radiographic and laboratory data in the context of their chronic comorbidities is felt to place them at high risk for further clinical deterioration. Furthermore, it is not anticipated that the patient will be medically stable for discharge from the hospital within 2 midnights of admission.   * I certify that at the point of admission it is my clinical judgment that the patient will require inpatient hospital care spanning beyond 2 midnights from the point of admission due to high intensity of service, high risk for further deterioration and high frequency of surveillance required.*  Author: Orene Desanctis, DO 08-Jan-2022 3:27 AM  For on call review [email or web address].

## 2021-12-31 ENCOUNTER — Encounter (HOSPITAL_COMMUNITY): Payer: Self-pay | Admitting: Family Medicine

## 2021-12-31 ENCOUNTER — Other Ambulatory Visit: Payer: Self-pay

## 2021-12-31 DIAGNOSIS — I10 Essential (primary) hypertension: Secondary | ICD-10-CM | POA: Diagnosis not present

## 2021-12-31 DIAGNOSIS — N179 Acute kidney failure, unspecified: Secondary | ICD-10-CM | POA: Diagnosis not present

## 2021-12-31 DIAGNOSIS — J189 Pneumonia, unspecified organism: Principal | ICD-10-CM

## 2021-12-31 DIAGNOSIS — N184 Chronic kidney disease, stage 4 (severe): Secondary | ICD-10-CM | POA: Diagnosis not present

## 2021-12-31 DIAGNOSIS — J9601 Acute respiratory failure with hypoxia: Secondary | ICD-10-CM | POA: Diagnosis not present

## 2021-12-31 LAB — CBC
HCT: 27.8 % — ABNORMAL LOW (ref 36.0–46.0)
Hemoglobin: 8.2 g/dL — ABNORMAL LOW (ref 12.0–15.0)
MCH: 26.7 pg (ref 26.0–34.0)
MCHC: 29.5 g/dL — ABNORMAL LOW (ref 30.0–36.0)
MCV: 90.6 fL (ref 80.0–100.0)
Platelets: 373 10*3/uL (ref 150–400)
RBC: 3.07 MIL/uL — ABNORMAL LOW (ref 3.87–5.11)
RDW: 17 % — ABNORMAL HIGH (ref 11.5–15.5)
WBC: 12.5 10*3/uL — ABNORMAL HIGH (ref 4.0–10.5)
nRBC: 0.2 % (ref 0.0–0.2)

## 2021-12-31 LAB — BASIC METABOLIC PANEL
Anion gap: 12 (ref 5–15)
BUN: 42 mg/dL — ABNORMAL HIGH (ref 8–23)
CO2: 19 mmol/L — ABNORMAL LOW (ref 22–32)
Calcium: 8.4 mg/dL — ABNORMAL LOW (ref 8.9–10.3)
Chloride: 108 mmol/L (ref 98–111)
Creatinine, Ser: 2.7 mg/dL — ABNORMAL HIGH (ref 0.44–1.00)
GFR, Estimated: 15 mL/min — ABNORMAL LOW (ref >60)
Glucose, Bld: 109 mg/dL — ABNORMAL HIGH (ref 70–99)
Potassium: 5.1 mmol/L (ref 3.5–5.1)
Sodium: 139 mmol/L (ref 135–145)

## 2021-12-31 LAB — APTT: aPTT: 111 seconds — ABNORMAL HIGH (ref 24–36)

## 2021-12-31 LAB — HEPARIN LEVEL (UNFRACTIONATED): Heparin Unfractionated: >1.1 IU/mL — ABNORMAL HIGH (ref 0.30–0.70)

## 2021-12-31 MED ORDER — BIOTENE DRY MOUTH MT LIQD: 15.0000 mL | OROMUCOSAL | Status: DC | PRN

## 2021-12-31 MED ORDER — ACETAMINOPHEN 650 MG RE SUPP: 650.0000 mg | Freq: Four times a day (QID) | RECTAL | Status: DC | PRN

## 2021-12-31 MED ORDER — METRONIDAZOLE 500 MG/100ML IV SOLN
500.0000 mg | Freq: Two times a day (BID) | INTRAVENOUS | Status: DC
  Administered 2021-12-31: 500 mg via INTRAVENOUS
  Filled 2021-12-31: qty 100

## 2021-12-31 MED ORDER — MORPHINE BOLUS VIA INFUSION: 1.0000 mg | INTRAVENOUS | Status: DC | PRN

## 2021-12-31 MED ORDER — LORAZEPAM 2 MG/ML IJ SOLN
0.5000 mg | Freq: Once | INTRAMUSCULAR | Status: AC
  Administered 2021-12-31: 0.5 mg via INTRAVENOUS
  Filled 2021-12-31: qty 1

## 2021-12-31 MED ORDER — LORAZEPAM 2 MG/ML IJ SOLN: 1.0000 mg | INTRAMUSCULAR | Status: DC | PRN

## 2021-12-31 MED ORDER — MORPHINE 100MG IN NS 100ML (1MG/ML) PREMIX INFUSION: 1.0000 mg/h | INTRAVENOUS | Status: DC

## 2021-12-31 MED ORDER — DIPHENHYDRAMINE HCL 50 MG/ML IJ SOLN: 12.5000 mg | INTRAMUSCULAR | Status: DC | PRN

## 2021-12-31 MED ORDER — MORPHINE SULFATE (PF) 2 MG/ML IV SOLN
1.0000 mg | INTRAVENOUS | Status: DC | PRN
  Administered 2021-12-31: 1 mg via INTRAVENOUS
  Filled 2021-12-31: qty 1

## 2021-12-31 MED ORDER — ONDANSETRON HCL 4 MG/2ML IJ SOLN: 4.0000 mg | Freq: Four times a day (QID) | INTRAMUSCULAR | Status: DC | PRN

## 2021-12-31 MED ORDER — POLYVINYL ALCOHOL 1.4 % OP SOLN: 1.0000 [drp] | Freq: Four times a day (QID) | OPHTHALMIC | Status: DC | PRN

## 2021-12-31 MED ORDER — GLYCOPYRROLATE 0.2 MG/ML IJ SOLN: 0.4000 mg | INTRAMUSCULAR | Status: DC | PRN

## 2022-01-05 NOTE — Progress Notes (Signed)
[facility]                                                                              Tracy [name], is a 88 y.o. female, DOB - 1919/09/25, YIR:[id number] Admit date - 01/03/2022    Outpatient Primary MD for the patient is Charlane Ferretti, MD  LOS - 1  days  Chief Complaint  Patient presents with   Respiratory Distress       Brief summary   Patient is 88 year old female with dementia, embolic CVA, DVT on 5/00/9381 on Eliquis, CKD 4, HTN, seizure, chronic anemia presented from SNF with respiratory distress.  Patient was unable to provide history, reportedly SNF staff noted that she had been less interactive for the past few days, EMS reported that she was hypoxic down to 78% on her typical oxygen supplementation.  She was placed on BiPAP in the ED. Work-up showed WBCs 19.9, chronic anemia with hemoglobin of 9, creatinine elevated 2.54, baseline 1-2.  BNP 270. Negative flu/COVID PCR.  Chest x-ray concerning for bilateral multifocal pneumonia and possible pulmonary edema Patient was placed on BiPAP and admitted for further work-up.  Assessment & Plan    Principal Problem:   Acute respiratory failure with hypoxia (HCC) secondary to multifocal pneumonia/pulmonary edema -Chest x-ray concerning for multifocal pneumonia and possible edema, has bilateral LE edema -Patient was placed on IV vancomycin, Rocephin, received a dose of Lasix 20 mg IV x1, no prior known diagnosis of CHF -In ED, continued to have worsening respiratory status with labored respiration and restlessness.  Family was updated by admitting physician with options of comfort care vs BiPAP/acute care.  Patient's niece (H POA) requested no intubation or mechanical ventilation however continue with BiPAP and acute care.   -Received Lasix 20 mg IV x1, creatinine worsened to 2.7. -Updated with the patient's niece, Ms. Ferol Luz, no significant improvement since admission, unresponsive, on BiPAP. GOC  discussed.  Patient's niece has decided to pursue comfort care status. -Palliative medicine consulted  Active Problems:   History of femoral DVT 09/18/21 (deep vein thrombosis) -Currently on BiPAP, unable to take p.o. now -Transition from Eliquis to IV heparin     Essential hypertension -BP currently stable    Anemia due to stage 4 chronic kidney disease ([place]) -AKI on CKD stage IV, creatinine elevated 2.54 from prior 1.9 -Received Lasix 20 mg IV x1 overnight, creatinine now up to 2.7, will hold off on Lasix    Hyperlipidemia, unspecified -Comfort care status    Vascular dementia ([place]) -Currently not following commands, had received IV Ativan 0.5 mg x 1 earlier this a.m. -BiPAP  History of seizures -Continue IV Keppra, currently not able to take p.o. on BiPAP   History of embolic CVA in 11/2991 -Not able to take p.o., on IV heparin    Code Status: DNR DNI, comfort care DVT Prophylaxis:    On IV heparin Level of Care: Level of care: Progressive Family Communication: Updated patient's niece, Ms. Ferol Luz in detail, no significant improvement, decided to pursue comfort care status  Disposition Plan:      Remains inpatient appropriate: Comfort care status  Procedures:  BiPAP at  Consultants:   None  Antimicrobials:   Anti-infectives (From admission, onward)    Start     Dose/Rate Route Frequency Ordered Stop   01/09/2022 1800  ceFEPIme (MAXIPIME) 1 g in sodium chloride 0.9 % 100 mL IVPB        1 g 200 mL/hr over 30 Minutes Intravenous Every 24 hours 12/14/2021 2318     Jan 09, 2022 0300  metroNIDAZOLE (FLAGYL) IVPB 500 mg        500 mg 100 mL/hr over 60 Minutes Intravenous Every 12 hours 01/09/2022 0244     12/07/2021 2016  vancomycin variable dose per unstable renal function (pharmacist dosing)         Does not apply See admin instructions 12/24/2021 2017     12/11/2021 1830  vancomycin (VANCOCIN) IVPB 1000 mg/200 mL premix        1,000 mg 200 mL/hr over 60 Minutes  Intravenous  Once 12/20/2021 1810 12/28/2021 2017   12/11/2021 1745  ceFEPIme (MAXIPIME) 1 g in sodium chloride 0.9 % 100 mL IVPB        1 g 200 mL/hr over 30 Minutes Intravenous  Once 12/29/2021 1739 12/17/2021 1902          Medications  allopurinol  200 mg Oral Daily   amLODipine  10 mg Oral Daily   atorvastatin  20 mg Oral Daily   calcium carbonate  1 tablet Oral Q breakfast   vitamin D3  1,000 Units Oral Daily   ferrous sulfate  325 mg Oral Q lunch   melatonin  5 mg Oral QHS   memantine  5 mg Oral BID   mirtazapine  15 mg Oral QHS   pantoprazole  40 mg Oral QAC breakfast   polyethylene glycol  17 g Oral BID   senna  2 tablet Oral BID   vancomycin variable dose per unstable renal function (pharmacist dosing)   Does not apply See admin instructions      Subjective:   Tracy [name] was seen and examined today.  Still on BiPAP, coarse lung sounds, unresponsive   Objective:   Vitals:   2022-01-09 0830 2022-01-09 0900 2022-01-09 0930 Jan 09, 2022 1030  BP: (!) 111/57 (!) 107/58 107/83 (!) 131/55  Pulse: 90 90 92 92  Resp: (!) 24 (!) 23 (!) 25 (!) 24  Temp:      TempSrc:      SpO2: 94% 92% 94% (!) 87%  Weight:      Height:        Intake/Output Summary (Last 24 hours) at 2022-01-09 1117 Last data filed at 01-09-22 0452 Gross per 24 hour  Intake 301.49 ml  Output --  Net 301.49 ml     Wt Readings from Last 3 Encounters:  12/15/2021 43.5 kg  11/03/21 42 kg  10/11/21 41.8 kg     Exam General: Responsive, not following commands, on BiPAP Cardiovascular: S1 S2 auscultated,  RRR Respiratory: Bilateral rhonchi throughout Gastrointestinal: Soft, nontender, nondistended, + bowel sounds Ext: no pedal edema bilaterally Neuro: not following commands Psych: not following commands, also has dementia    Data Reviewed:  I have personally reviewed following labs    CBC Lab Results  Component Value Date   WBC 12.5 (H) 01/09/2022   RBC 3.07 (L) Jan 09, 2022   HGB 8.2 (L) 01-09-22    HCT 27.8 (L) 01/09/22   MCV 90.6 2022-01-09   MCH 26.7 Jan 09, 2022   PLT 373 2022/01/09   MCHC 29.5 (L) 09-Jan-2022  RDW 17.0 (H) 2022/01/06   LYMPHSABS 0.6 (L) 12/08/2021   MONOABS 0.9 12/24/2021   EOSABS 0.0 12/29/2021   BASOSABS 0.0 33/88/2666     Last metabolic panel Lab Results  Component Value Date   NA 139 Jan 06, 2022   K 5.1 2022-01-06   CL 108 January 06, 2022   CO2 19 (L) January 06, 2022   BUN 42 (H) 2022-01-06   CREATININE 2.70 (H) Jan 06, 2022   GLUCOSE 109 (H) January 06, 2022   GFRNONAA 15 (L) January 06, 2022   GFRAA (L) 02/27/2009    31        The eGFR has been calculated using the MDRD equation. This calculation has not been validated in all clinical situations. eGFR's persistently <60 mL/min signify possible Chronic Kidney Disease.   CALCIUM 8.4 (L) Jan 06, 2022   PROT 6.4 (L) 12/07/2021   ALBUMIN 2.5 (L) 12/25/2021   BILITOT 0.8 12/23/2021   ALKPHOS 92 12/28/2021   AST 44 (H) 12/22/2021   ALT 14 12/11/2021   ANIONGAP 12 01-06-22     Radiology Studies: I have personally reviewed the imaging studies  DG Chest Port 1 View  Result Date: 12/15/2021 CLINICAL DATA:  Shortness of breath EXAM: PORTABLE CHEST 1 VIEW COMPARISON:  11/09/2021 FINDINGS: Cardiomegaly. Heterogeneous consolidation throughout the right lung with underlying diffuse bilateral interstitial pulmonary opacity. Osseous structures unremarkable. IMPRESSION: 1. Heterogeneous consolidation throughout the right lung with underlying diffuse bilateral interstitial pulmonary opacity, most consistent with multifocal infection, and/or edema. 2.  Cardiomegaly. Electronically Signed   By: Delanna Ahmadi M.D.   On: 12/17/2021 17:37       Chantry Headen M.D. [facility] 01/06/22, 11:17 AM  Available via Epic secure chat 7am-7pm After 7 pm, please refer to night coverage provider listed on amion.

## 2022-01-05 NOTE — Progress Notes (Signed)
ANTICOAGULATION CONSULT NOTE   Pharmacy Consult:  Heparin Indication: History of DVT  No Known Allergies  Patient Measurements: Height: 4\' 9"  (144.8 cm) Weight: 43.5 kg (95 lb 14.4 oz) IBW/kg (Calculated) : 38.6 Heparin Dosing Weight: 43 kg  Vital Signs: Temp: 97.5 F (36.4 C) (09/26 0739) Temp Source: Axillary (09/26 0739) BP: 110/56 (09/26 0733) Pulse Rate: 93 (09/26 0733)  Labs: Recent Labs    01/04/2022 1738 29-Jan-2022 0352 01/29/2022 0815  HGB 9.0* 8.2*  --   HCT 28.1* 27.8*  --   PLT 517* 373  --   APTT  --   --  111*  HEPARINUNFRC  --   --  >1.10*  CREATININE 2.54* 2.70*  --      Estimated Creatinine Clearance: 6.8 mL/min (A) (by C-G formula based on SCr of 2.7 mg/dL (H)).   Medical History: Past Medical History:  Diagnosis Date   Acute stroke due to occlusion of left posterior cerebral artery ([place]) 01/30/2021   Anemia due to stage 4 chronic kidney disease ([place]) 01/30/2021   Cerebral infarction due to carotid artery occlusion ([place]) 09/16/2021   Chronic kidney disease, stage 4 (severe) ([place]) 01/30/2021   Essential hypertension 01/30/2021   History of gout 09/16/2021   History of Right Occipital stroke ([place]) 09/17/2021   Left homonymous hemianopsia 09/17/2021   History of right occipital stroke 10/2020   Mixed hyperlipidemia 01/30/2021   Occipital stroke ([place]) 2022   Osteoporosis 09/17/2021    Assessment: 89 YOF presented with respiratory distress.  She has a history of DVT in June 0867 and embolic CVA.  Pharmacy consulted to dose IV heparin while Eliquis is on hold (last dose 9/25 at 0900).  Baseline labs reviewed.  Initial aPTT slightly supra therapeutic at 111 seconds on 900 units/hr, Anti-Xa level elevated as expected with last Eliquis dose  Goal of Therapy:  Heparin level 0.3-0.7 units/ml aPTT 66-102 seconds Monitor platelets by anticoagulation protocol: Yes    Plan:  Decrease heparin gtt to 850 units/hr F/u 8 hour aPTT to confirm  Bertis Ruddy,  PharmD Clinical Pharmacist ED Pharmacist Phone # [phone] 29-Jan-2022 9:45 AM

## 2022-01-05 NOTE — ED Notes (Signed)
Pt if being taken off bipap and placed on  @ 6L

## 2022-01-05 NOTE — ED Notes (Signed)
Patient laying sidewise in bed with legs hanging out side of railing. Patient repositioned with RN, NT and RT at this time.

## 2022-01-05 NOTE — Progress Notes (Signed)
Heart Failure Navigator Progress Note  Assessed for Heart & Vascular TOC clinic readiness.  Patient does not meet criteria due to previously diagnosed dementia.   Navigator available for reassessment of patient.    Pricilla Holm, MSN, RN Heart Failure Nurse Navigator

## 2022-01-05 NOTE — Progress Notes (Signed)
  Interdisciplinary Goals of Care Family Meeting   Date carried out: 2022-01-26  Location of the meeting: Phone conference  Member's involved: Physician (myself), patient's niece, Ms. Ferol Luz   Durable Power of Tour manager: Patient's niece, Ms. Ferol Luz  Discussion: We discussed goals of care for patient, Tracy [name]. Robina Ade  We discussed diagnoses, prognosis, GOC, EOL wishes disposition and options. No significant improvement since admission, remains on BiPAP, unresponsive with coarse lung sounds.  The difference between an aggressive medical intervention path and a comfort care path was discussed.    Patient's niece requested to remove the BiPAP to initiate comfort care orders after  their pastor has visited and offered prayers.    Code status: Full DNR, comfort care  Disposition: In-patient comfort care  Time spent for the meeting: 77mins   Monserat Prestigiacomo 2022-01-26, 11:38 AM

## 2022-01-05 NOTE — Death Summary Note (Signed)
DEATH SUMMARY   Patient Details  Name: Tracy [name] MRN: [id number] DOB: January 31, 1920 NOB:SJGG, Siri Cole, MD Admission/Discharge Information   Admit Date:  2022-01-21  Date of Death: Date of Death: 22-Jan-2022  Time of Death: Time of Death: 07-14-19  Length of Stay: 1   Principle Cause of death: Acute respiratory failure with hypoxia  Hospital Diagnoses:    Acute respiratory failure with hypoxia ([place]) Acute kidney injury superimposed on chronic kidney disease ([place])   Multifocal pneumonia   History of femoral DVT 09/18/21 (deep vein thrombosis)   Essential hypertension   Anemia due to stage 4 chronic kidney disease (HCC)   Hyperlipidemia, unspecified   Vascular dementia ([place])   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  Patient was unable to provide history, reportedly SNF staff noted that she had been less interactive for the past few days, EMS reported that she was hypoxic down to 78% on her typical oxygen supplementation.  She was placed on BiPAP in the ED. Work-up showed WBCs 19.9, chronic anemia with hemoglobin of 9, creatinine elevated 2.54, baseline 1-2.  BNP 270. Negative flu/COVID PCR.  Chest x-ray concerning for bilateral multifocal pneumonia and possible pulmonary edema Patient was placed on BiPAP and admitted for further work-up.  Assessment and Plan:   Acute respiratory failure with hypoxia (HCC) secondary to multifocal pneumonia/pulmonary edema -Chest x-ray concerning for multifocal pneumonia and possible edema, has bilateral LE edema -Patient was placed on IV vancomycin, Rocephin, received a dose of Lasix 20 mg IV x1, no prior known diagnosis of CHF -Patient continued to have worsening respiratory status with labored respiration and restlessness despite  BiPAP, IV Lasix. -Received Lasix 20 mg IV x1, creatinine worsened to 2.7. -I discussed with patient's niece, Ms. Ferol Luz and palliative medicine consult was also obtained for goals of care.  Patient had no significant improvement since admission, was unresponsive on BiPAP with worsening of renal function. -Patient's niece then requested to proceed with comfort care status and not prolong any more suffering      History of femoral DVT 09/18/21 (deep vein thrombosis) -Patient was transitioned to IV heparin as she was unable to take p.o. due to BiPAP       Essential hypertension -BP was stable     Anemia due to stage 4 chronic kidney disease ([place]) -AKI on CKD stage IV, creatinine elevated 2.54 from prior 1.9 -Received Lasix 20 mg IV x1 overnight, creatinine trended up to 2.7 hence Lasix was held.       Hyperlipidemia, unspecified -Patient was placed on comfort care status      Vascular dementia [facility]) -Patient was placed on BiPAP, not following any commands, unresponsive   History of seizures -Patient placed on IV Keppra   History of embolic CVA in 08/352 -Not able to take p.o., on IV heparin  Patient passed on 01-22-22 at 2:21 PM    Procedures Bipap  Consultations: Palliative medicine  The results of significant diagnostics from this hospitalization (including imaging, microbiology, ancillary and laboratory) are listed below for reference.   Significant Diagnostic Studies: DG Chest Port 1 View  Result Date: 01-21-2022 CLINICAL DATA:  Shortness of breath EXAM: PORTABLE CHEST 1 VIEW COMPARISON:  11/09/2021 FINDINGS: Cardiomegaly. Heterogeneous consolidation throughout the right lung with underlying diffuse bilateral interstitial pulmonary  opacity. Osseous structures unremarkable. IMPRESSION: 1. Heterogeneous consolidation throughout the right lung with underlying diffuse bilateral interstitial pulmonary opacity, most consistent with multifocal infection, and/or edema.  2.  Cardiomegaly. Electronically Signed   By: Delanna Ahmadi M.D.   On: 12/30/2021 17:37    Microbiology: Recent Results (from the past 240 hour(s))  Resp Panel by RT-PCR (Flu A&B, Covid) Anterior Nasal Swab     Status: None   Collection Time: 12/08/2021  5:13 PM   Specimen: Anterior Nasal Swab  Result Value Ref Range Status   SARS Coronavirus 2 by RT PCR NEGATIVE NEGATIVE Final    Comment: (NOTE) SARS-CoV-2 target nucleic acids are NOT DETECTED.  The SARS-CoV-2 RNA is generally detectable in upper respiratory specimens during the acute phase of infection. The lowest concentration of SARS-CoV-2 viral copies this assay can detect is 138 copies/mL. A negative result does not preclude SARS-Cov-2 infection and should not be used as the sole basis for treatment or other patient management decisions. A negative result may occur with  improper specimen collection/handling, submission of specimen other than nasopharyngeal swab, presence of viral mutation(s) within the areas targeted by this assay, and inadequate number of viral copies(<138 copies/mL). A negative result must be combined with clinical observations, patient history, and epidemiological information. The expected result is Negative.  Fact Sheet for Patients:  EntrepreneurPulse.com.au  Fact Sheet for Healthcare Providers:  IncredibleEmployment.be  This test is no t yet approved or cleared by the [place] FDA and  has been authorized for detection and/or diagnosis of SARS-CoV-2 by FDA under an Emergency Use Authorization (EUA). This EUA will remain  in effect (meaning this test can be used) for the duration of the COVID-19 declaration under Section 564(b)(1) of the Act, 21 U.S.C.section 360bbb-3(b)(1), unless the authorization is terminated  or revoked sooner.       Influenza A by PCR NEGATIVE NEGATIVE Final   Influenza B by PCR NEGATIVE NEGATIVE Final    Comment: (NOTE) The Xpert  Xpress SARS-CoV-2/FLU/RSV plus assay is intended as an aid in the diagnosis of influenza from Nasopharyngeal swab specimens and should not be used as a sole basis for treatment. Nasal washings and aspirates are unacceptable for Xpert Xpress SARS-CoV-2/FLU/RSV testing.  Fact Sheet for Patients: EntrepreneurPulse.com.au  Fact Sheet for Healthcare Providers: IncredibleEmployment.be  This test is not yet approved or cleared by the [place] FDA and has been authorized for detection and/or diagnosis of SARS-CoV-2 by FDA under an Emergency Use Authorization (EUA). This EUA will remain in effect (meaning this test can be used) for the duration of the COVID-19 declaration under Section 564(b)(1) of the Act, 21 U.S.C. section 360bbb-3(b)(1), unless the authorization is terminated or revoked.  Performed at [facility], [place] [address], [place], [place] [address]       Signed: Estill Cotta, MD

## 2022-01-05 NOTE — Progress Notes (Signed)
   2022/01/16 1445  Attending [place]  Attending Physician Notified Y  Attending Physician (First and Last Name) Dr. Tana Coast Ripudeep  Post Mortem Checklist  Date of Death 2022-01-16  Time of Death 07-08-19  Pronounced By Penni Bombard, RN/ Lovie Macadamia RN  Next of kin notified Yes  Name of next of kin notified of death Ferol Luz  Contact Person's Relationship to Patient Other relative  Contact Person's Phone Number [phone]  Contact Person's address [place] [address]  Family Communication Notes family declines visiting patient before transport to [place]  Was the patient a No Code Blue or a Limited Code Blue? Yes  Did the patient die unattended? No  Patient restrained? Not applicable  Height 4\' 9"  (1.448 m)  Weight 43.5 kg  Body preparation complete Y  [employer] (previously known as [employer])  Notification Date January 16, 2022  Notification Time [place]  [facility] Number [phone]  Is patient a potential donor? N  Autopsy  Autopsy requested by MD or Family ( Non ME Case) N/A  Patient and [place] Returned  Patient is satisfied that all belongings have been returned? Yes  Valuables returned to? no belongings at bedside  Specify valuables returned none  Dermatherapy linen/gowns NOT sent with patient or transporter Not applicable  Notifications  Patient Placement notified that Post Mortem checklist is complete Yes  Medical Examiner  Is this a medical examiner's case? [place] home name/address/phone # [place]  Planned location of pickup [place]

## 2022-01-05 NOTE — Consult Note (Signed)
Consultation Note Date: 01/16/2022   Patient Name: Tracy [name]  DOB: February 28, 1920  MRN: [id number]  Age / Sex: 88 y.o., female  PCP: Charlane Ferretti, MD Referring Physician: Orene Desanctis, DO  Reason for Consultation: Establishing goals of care  HPI/Patient Profile: 89 y.o. female  with past medical history of dementia, embolic CVA, DVT on 10/08/5007 on Eliquis, CKD 4, hypertension, seizure, chronic anemia admitted on 01/03/2022 with respiratory distress.   Patient admitted from SNF with bilateral multifocal pneumonia, AKI on CKD 4, acute respiratory failure with hypoxia on BiPAP.  Respiratory status continued to worsen after arrival.  PMT has been consulted to assist with goals of care conversation.  Clinical Assessment and Goals of Care:  I have reviewed medical records including EPIC notes, labs and imaging, received report from RN, assessed the patient and then met at the bedside with patient's niece/HCPOA to discuss diagnosis prognosis, GOC, EOL wishes, disposition and options.  I introduced Palliative Medicine as specialized medical care for people living with serious illness. It focuses on providing relief from the symptoms and stress of a serious illness. The goal is to improve quality of life for both the patient and the family.  We discussed a brief life review of the patient and then focused on their current illness.  The natural disease trajectory and expectations at EOL were discussed.  I attempted to elicit values and goals of care important to the patient.    Medical History Review and Understanding:  Reviewed patient's chronic and acute illnesses including history of dementia, kidney disease, acute respiratory and renal failure.  Patient's stroke last year has caused a gradual decline which has become more rapid over the past weekend. [name] understands the severity of patient's illness and poor  prognosis.  Social History: [name] shares that she is like a daughter to Tracy [name] and they have gotten very close over the years.  Her husband was raised by Joycelyn Schmid in the first 10 years of his life and they are very close as well.  They have a close church community.  She has never been married.  She is residing at [facility] and recently has been wanting to return home.  Palliative Symptoms: Dyspnea  Advance Directives: A detailed discussion regarding advanced directives was had.   Code Status: Concepts specific to code status, artifical feeding and hydration, and rehospitalization were considered and discussed.   Discussion: Met at the bedside with patient's niece [name] who is married to her nephew.  We discussed her concerns about her husband's recent renal transplant and exposure to infection given his immunocompromised state.  However, she wanted to come visit anyway understanding that patient is severely ill and that her time is limited with or without ongoing interventions.  She is having a hard time with the weight of the decisions at hand even though patient has been very clear in the past that she would not want aggressive measures such as dialysis, CPR, life support.  Emotional support and therapeutic listening was provided.  Patient was doing well up until this past Saturday based on her reports from family.  For the past couple of weeks, she has asked other family members to check in on patient due to her agitation whenever Debra left for the day.  She has been very adamantly requesting to go home.  She has also been asking about her parents and other deceased family members.  After discussing with her husband, he agreed to transition to comfort based on signs of  suffering seen today.  The difference between aggressive medical intervention and comfort care was considered in light of the patient's goals of care. We discussed that patient would no longer receive aggressive medical interventions  such as continuous vital signs, lab work, radiology testing, or medications not focused on comfort. All care would focus on how the patient is looking and feeling. This would include management of any symptoms that may cause discomfort, pain, shortness of breath, cough, nausea, agitation, anxiety, and/or secretions etc. Symptoms would be managed with medications and other non-pharmacological interventions such as spiritual support if requested, repositioning, music therapy, or therapeutic listening. Family verbalized understanding and appreciation.     Discussed the importance of continued conversation with family and the medical providers regarding overall plan of care and treatment options, ensuring decisions are within the context of the patient's values and GOCs.   Questions and concerns were addressed.  Hard Choices booklet left for review.  Gone from my sight booklet provided for review.  The family was encouraged to call with questions or concerns.  PMT will continue to support holistically.   SUMMARY OF RECOMMENDATIONS   -DNR confirmed -Transition to comfort care after niece and pastor finished their visit Morphine gtt with as needed boluses for pain/air hunger/comfort Robinul PRN for excessive secretions Ativan PRN for agitation/anxiety Zofran PRN for nausea Liquifilm tears PRN for dry eyes May have comfort feeding Unrestricted visitations in the setting of EOL (per policy) Oxygen PRN 2L or less for comfort. No escalation.   -Psychosocial and emotional support provided -PMT will continue to follow and support  Prognosis:  Hours - Days  Discharge Planning: Anticipated Hospital Death      Primary Diagnoses: Present on Admission:  Acute respiratory failure with hypoxia ([place])  Essential hypertension  Anemia due to stage 4 chronic kidney disease ([place])  Hyperlipidemia, unspecified  Vascular dementia ([place])  Multifocal pneumonia  Physical Exam Vitals and nursing note reviewed.   Constitutional:      Appearance: She is ill-appearing.     Comments: BiPAP in place  Cardiovascular:     Rate and Rhythm: Normal rate.  Pulmonary:     Effort: Tachypnea present.  Skin:    General: Skin is cool and dry.  Neurological:     Mental Status: She is unresponsive.    Vital Signs: BP (!) 127/55   Pulse 92   Temp (!) 97.5 F (36.4 C) (Axillary)   Resp (!) 21   Ht $R'4\' 9"'NT$  (1.448 m)   Wt 43.5 kg   SpO2 (!) 89%   BMI 20.75 kg/m  Pain Scale: Faces     SpO2: SpO2: (!) 89 % O2 Device:SpO2: (!) 89 % O2 Flow Rate: .   Palliative Assessment/Data: 10%     MDM: high   Daelyn Mozer Johnnette Litter, PA-C  Palliative Medicine Team Team phone # [phone]  Thank you for allowing the Palliative Medicine Team to assist in the care of this patient. Please utilize secure chat with additional questions, if there is no response within 30 minutes please call the above phone number.  Palliative Medicine Team providers are available by phone from 7am to 7pm daily and can be reached through the team cell phone.  Should this patient require assistance outside of these hours, please call the patient's attending physician.

## 2022-01-05 NOTE — ED Notes (Signed)
This RN called and verified that the pastor and niece had both been by and left and were ok with the implementation of the new orders they have both been and left I will proceed with orders

## 2022-01-05 NOTE — Progress Notes (Signed)
Family notified by charge RN of pt's passing. Niece provided name of funeral home. [facility] contacted.

## 2022-01-05 NOTE — Progress Notes (Addendum)
Received pt from ED at this time. Pt on unit for less than 5 min when NT notified RN that pt had passed. 2 RN verification of pt passing with charge RN Apolonio Schneiders. Time of death 6.Attending paged.

## 2022-01-05 DEATH — deceased

## 2022-01-24 ENCOUNTER — Ambulatory Visit: Payer: Medicare PPO | Admitting: Podiatry

## 2022-01-28 ENCOUNTER — Ambulatory Visit: Payer: Medicare PPO | Admitting: Adult Health

## 2022-02-03 ENCOUNTER — Inpatient Hospital Stay: Payer: Medicare PPO | Admitting: Neurology
